# Patient Record
Sex: Male | Born: 1945
Health system: Southern US, Community
[De-identification: ages and names within clinical notes are randomized; demographics above are authoritative.]

## PROBLEM LIST (undated history)

## (undated) ENCOUNTER — Ambulatory Visit (HOSPITAL_BASED_OUTPATIENT_CLINIC_OR_DEPARTMENT_OTHER): Source: Home / Self Care

## (undated) DIAGNOSIS — M1990 Unspecified osteoarthritis, unspecified site: Secondary | ICD-10-CM

## (undated) DIAGNOSIS — N052 Unspecified nephritic syndrome with diffuse membranous glomerulonephritis: Secondary | ICD-10-CM

## (undated) DIAGNOSIS — E041 Nontoxic single thyroid nodule: Secondary | ICD-10-CM

## (undated) DIAGNOSIS — D137 Benign neoplasm of endocrine pancreas: Secondary | ICD-10-CM

## (undated) DIAGNOSIS — R7881 Bacteremia: Secondary | ICD-10-CM

## (undated) DIAGNOSIS — C801 Malignant (primary) neoplasm, unspecified: Secondary | ICD-10-CM

## (undated) DIAGNOSIS — E7800 Pure hypercholesterolemia, unspecified: Secondary | ICD-10-CM

## (undated) DIAGNOSIS — I82409 Acute embolism and thrombosis of unspecified deep veins of unspecified lower extremity: Secondary | ICD-10-CM

## (undated) DIAGNOSIS — R768 Other specified abnormal immunological findings in serum: Secondary | ICD-10-CM

## (undated) DIAGNOSIS — K5790 Diverticulosis of intestine, part unspecified, without perforation or abscess without bleeding: Secondary | ICD-10-CM

## (undated) DIAGNOSIS — I2699 Other pulmonary embolism without acute cor pulmonale: Secondary | ICD-10-CM

## (undated) DIAGNOSIS — K859 Acute pancreatitis without necrosis or infection, unspecified: Secondary | ICD-10-CM

## (undated) DIAGNOSIS — I8290 Acute embolism and thrombosis of unspecified vein: Secondary | ICD-10-CM

## (undated) DIAGNOSIS — B9620 Unspecified Escherichia coli [E. coli] as the cause of diseases classified elsewhere: Secondary | ICD-10-CM

## (undated) DIAGNOSIS — I10 Essential (primary) hypertension: Secondary | ICD-10-CM

## (undated) HISTORY — DX: Malignant (primary) neoplasm, unspecified: C80.1

## (undated) HISTORY — DX: Acute embolism and thrombosis of unspecified deep veins of unspecified lower extremity: I82.409

## (undated) HISTORY — DX: Benign neoplasm of endocrine pancreas: D13.7

## (undated) HISTORY — DX: Diverticulosis of intestine, part unspecified, without perforation or abscess without bleeding: K57.90

## (undated) HISTORY — DX: Acute pancreatitis without necrosis or infection, unspecified: K85.90

## (undated) HISTORY — DX: Unspecified Escherichia coli (E. coli) as the cause of diseases classified elsewhere: B96.20

## (undated) HISTORY — DX: Unspecified nephritic syndrome with diffuse membranous glomerulonephritis: N05.2

## (undated) HISTORY — PX: TONSILLECTOMY: SUR1361

## (undated) HISTORY — DX: Nontoxic single thyroid nodule: E04.1

## (undated) HISTORY — PX: CHOLECYSTECTOMY: SHX55

## (undated) HISTORY — PX: BIOPSY THYROID: PRO38

## (undated) HISTORY — PX: WHIPPLE PROCEDURE: SHX2667

## (undated) HISTORY — PX: CARPAL TUNNEL RELEASE: SHX101

## (undated) HISTORY — DX: Unspecified osteoarthritis, unspecified site: M19.90

## (undated) HISTORY — DX: Essential (primary) hypertension: I10

## (undated) HISTORY — PX: RENAL BIOPSY: SHX156

## (undated) HISTORY — DX: Acute embolism and thrombosis of unspecified vein: I82.90

## (undated) HISTORY — DX: Pure hypercholesterolemia, unspecified: E78.00

## (undated) HISTORY — DX: Bacteremia: R78.81

## (undated) HISTORY — DX: Other specified abnormal immunological findings in serum: R76.8

## (undated) HISTORY — DX: Other pulmonary embolism without acute cor pulmonale: I26.99

## (undated) HISTORY — PX: PARTIAL COLECTOMY: SHX5273

---

## 2000-04-09 HISTORY — PX: PANCREATICODUODENECTOMY: SUR1000

## 2014-04-13 DIAGNOSIS — M5481 Occipital neuralgia: Secondary | ICD-10-CM | POA: Diagnosis not present

## 2014-04-15 DIAGNOSIS — M5481 Occipital neuralgia: Secondary | ICD-10-CM | POA: Diagnosis not present

## 2014-04-20 DIAGNOSIS — M5481 Occipital neuralgia: Secondary | ICD-10-CM | POA: Diagnosis not present

## 2014-04-22 DIAGNOSIS — M5481 Occipital neuralgia: Secondary | ICD-10-CM | POA: Diagnosis not present

## 2014-04-27 DIAGNOSIS — M5481 Occipital neuralgia: Secondary | ICD-10-CM | POA: Diagnosis not present

## 2014-04-29 DIAGNOSIS — M5481 Occipital neuralgia: Secondary | ICD-10-CM | POA: Diagnosis not present

## 2014-05-05 DIAGNOSIS — M5481 Occipital neuralgia: Secondary | ICD-10-CM | POA: Diagnosis not present

## 2014-05-07 DIAGNOSIS — M5481 Occipital neuralgia: Secondary | ICD-10-CM | POA: Diagnosis not present

## 2014-05-18 DIAGNOSIS — M5481 Occipital neuralgia: Secondary | ICD-10-CM | POA: Diagnosis not present

## 2014-05-20 DIAGNOSIS — M5481 Occipital neuralgia: Secondary | ICD-10-CM | POA: Diagnosis not present

## 2014-05-25 DIAGNOSIS — M5481 Occipital neuralgia: Secondary | ICD-10-CM | POA: Diagnosis not present

## 2014-05-28 DIAGNOSIS — M5481 Occipital neuralgia: Secondary | ICD-10-CM | POA: Diagnosis not present

## 2014-06-01 DIAGNOSIS — C229 Malignant neoplasm of liver, not specified as primary or secondary: Secondary | ICD-10-CM | POA: Diagnosis not present

## 2014-06-02 DIAGNOSIS — M5481 Occipital neuralgia: Secondary | ICD-10-CM | POA: Diagnosis not present

## 2014-06-04 DIAGNOSIS — M5481 Occipital neuralgia: Secondary | ICD-10-CM | POA: Diagnosis not present

## 2014-06-08 DIAGNOSIS — M5481 Occipital neuralgia: Secondary | ICD-10-CM | POA: Diagnosis not present

## 2014-06-10 DIAGNOSIS — Z86018 Personal history of other benign neoplasm: Secondary | ICD-10-CM | POA: Diagnosis not present

## 2014-06-10 DIAGNOSIS — Z8507 Personal history of malignant neoplasm of pancreas: Secondary | ICD-10-CM | POA: Diagnosis not present

## 2014-06-10 DIAGNOSIS — R5381 Other malaise: Secondary | ICD-10-CM | POA: Diagnosis not present

## 2014-06-10 DIAGNOSIS — R61 Generalized hyperhidrosis: Secondary | ICD-10-CM | POA: Diagnosis not present

## 2014-06-11 DIAGNOSIS — M5481 Occipital neuralgia: Secondary | ICD-10-CM | POA: Diagnosis not present

## 2014-06-22 DIAGNOSIS — M5481 Occipital neuralgia: Secondary | ICD-10-CM | POA: Diagnosis not present

## 2014-06-24 DIAGNOSIS — M5481 Occipital neuralgia: Secondary | ICD-10-CM | POA: Diagnosis not present

## 2014-06-29 DIAGNOSIS — M5481 Occipital neuralgia: Secondary | ICD-10-CM | POA: Diagnosis not present

## 2014-07-01 DIAGNOSIS — M5481 Occipital neuralgia: Secondary | ICD-10-CM | POA: Diagnosis not present

## 2014-07-06 DIAGNOSIS — M5481 Occipital neuralgia: Secondary | ICD-10-CM | POA: Diagnosis not present

## 2014-07-09 DIAGNOSIS — M5481 Occipital neuralgia: Secondary | ICD-10-CM | POA: Diagnosis not present

## 2014-07-12 DIAGNOSIS — G5601 Carpal tunnel syndrome, right upper limb: Secondary | ICD-10-CM | POA: Diagnosis not present

## 2014-07-12 DIAGNOSIS — R42 Dizziness and giddiness: Secondary | ICD-10-CM | POA: Diagnosis not present

## 2014-07-12 DIAGNOSIS — S060X1D Concussion with loss of consciousness of 30 minutes or less, subsequent encounter: Secondary | ICD-10-CM | POA: Diagnosis not present

## 2014-07-12 DIAGNOSIS — M5032 Other cervical disc degeneration, mid-cervical region: Secondary | ICD-10-CM | POA: Diagnosis not present

## 2014-07-15 DIAGNOSIS — M5481 Occipital neuralgia: Secondary | ICD-10-CM | POA: Diagnosis not present

## 2014-07-19 DIAGNOSIS — M5481 Occipital neuralgia: Secondary | ICD-10-CM | POA: Diagnosis not present

## 2014-07-26 DIAGNOSIS — M5481 Occipital neuralgia: Secondary | ICD-10-CM | POA: Diagnosis not present

## 2014-07-29 DIAGNOSIS — M5481 Occipital neuralgia: Secondary | ICD-10-CM | POA: Diagnosis not present

## 2014-08-02 DIAGNOSIS — M5481 Occipital neuralgia: Secondary | ICD-10-CM | POA: Diagnosis not present

## 2014-08-05 DIAGNOSIS — M5481 Occipital neuralgia: Secondary | ICD-10-CM | POA: Diagnosis not present

## 2014-08-09 DIAGNOSIS — R05 Cough: Secondary | ICD-10-CM | POA: Diagnosis not present

## 2014-08-09 DIAGNOSIS — J208 Acute bronchitis due to other specified organisms: Secondary | ICD-10-CM | POA: Diagnosis not present

## 2014-08-10 DIAGNOSIS — M5481 Occipital neuralgia: Secondary | ICD-10-CM | POA: Diagnosis not present

## 2014-11-22 DIAGNOSIS — Z0000 Encounter for general adult medical examination without abnormal findings: Secondary | ICD-10-CM | POA: Diagnosis not present

## 2014-11-22 DIAGNOSIS — Z125 Encounter for screening for malignant neoplasm of prostate: Secondary | ICD-10-CM | POA: Diagnosis not present

## 2014-11-22 DIAGNOSIS — R109 Unspecified abdominal pain: Secondary | ICD-10-CM | POA: Diagnosis not present

## 2014-11-22 DIAGNOSIS — N529 Male erectile dysfunction, unspecified: Secondary | ICD-10-CM | POA: Diagnosis not present

## 2014-11-22 DIAGNOSIS — Z7189 Other specified counseling: Secondary | ICD-10-CM | POA: Diagnosis not present

## 2014-12-03 DIAGNOSIS — Z1211 Encounter for screening for malignant neoplasm of colon: Secondary | ICD-10-CM | POA: Diagnosis not present

## 2015-01-21 DIAGNOSIS — Z23 Encounter for immunization: Secondary | ICD-10-CM | POA: Diagnosis not present

## 2015-03-24 DIAGNOSIS — R51 Headache: Secondary | ICD-10-CM | POA: Diagnosis not present

## 2015-03-24 DIAGNOSIS — M50323 Other cervical disc degeneration at C6-C7 level: Secondary | ICD-10-CM | POA: Diagnosis not present

## 2015-03-29 DIAGNOSIS — R51 Headache: Secondary | ICD-10-CM | POA: Diagnosis not present

## 2015-04-22 DIAGNOSIS — R51 Headache: Secondary | ICD-10-CM | POA: Diagnosis not present

## 2015-04-22 DIAGNOSIS — M50323 Other cervical disc degeneration at C6-C7 level: Secondary | ICD-10-CM | POA: Diagnosis not present

## 2015-07-01 DIAGNOSIS — L0390 Cellulitis, unspecified: Secondary | ICD-10-CM | POA: Diagnosis not present

## 2015-07-18 DIAGNOSIS — H2513 Age-related nuclear cataract, bilateral: Secondary | ICD-10-CM | POA: Diagnosis not present

## 2015-07-18 DIAGNOSIS — H04123 Dry eye syndrome of bilateral lacrimal glands: Secondary | ICD-10-CM | POA: Diagnosis not present

## 2015-09-06 DIAGNOSIS — C254 Malignant neoplasm of endocrine pancreas: Secondary | ICD-10-CM | POA: Diagnosis not present

## 2015-09-06 DIAGNOSIS — C259 Malignant neoplasm of pancreas, unspecified: Secondary | ICD-10-CM | POA: Diagnosis not present

## 2015-09-06 DIAGNOSIS — D490 Neoplasm of unspecified behavior of digestive system: Secondary | ICD-10-CM | POA: Diagnosis not present

## 2015-09-06 DIAGNOSIS — Z8639 Personal history of other endocrine, nutritional and metabolic disease: Secondary | ICD-10-CM | POA: Diagnosis not present

## 2015-09-07 DIAGNOSIS — C259 Malignant neoplasm of pancreas, unspecified: Secondary | ICD-10-CM | POA: Diagnosis not present

## 2015-09-07 DIAGNOSIS — R918 Other nonspecific abnormal finding of lung field: Secondary | ICD-10-CM | POA: Diagnosis not present

## 2015-09-07 DIAGNOSIS — Z8507 Personal history of malignant neoplasm of pancreas: Secondary | ICD-10-CM | POA: Diagnosis not present

## 2015-10-14 DIAGNOSIS — E559 Vitamin D deficiency, unspecified: Secondary | ICD-10-CM | POA: Diagnosis not present

## 2015-10-14 DIAGNOSIS — H02826 Cysts of left eye, unspecified eyelid: Secondary | ICD-10-CM | POA: Diagnosis not present

## 2015-10-14 DIAGNOSIS — D137 Benign neoplasm of endocrine pancreas: Secondary | ICD-10-CM | POA: Diagnosis not present

## 2015-10-14 DIAGNOSIS — M5412 Radiculopathy, cervical region: Secondary | ICD-10-CM | POA: Diagnosis not present

## 2015-10-21 DIAGNOSIS — D485 Neoplasm of uncertain behavior of skin: Secondary | ICD-10-CM | POA: Diagnosis not present

## 2015-10-24 DIAGNOSIS — H0289 Other specified disorders of eyelid: Secondary | ICD-10-CM | POA: Diagnosis not present

## 2016-01-23 DIAGNOSIS — Z23 Encounter for immunization: Secondary | ICD-10-CM | POA: Diagnosis not present

## 2016-03-19 DIAGNOSIS — M353 Polymyalgia rheumatica: Secondary | ICD-10-CM | POA: Diagnosis not present

## 2016-03-19 DIAGNOSIS — Z125 Encounter for screening for malignant neoplasm of prostate: Secondary | ICD-10-CM | POA: Diagnosis not present

## 2016-03-19 DIAGNOSIS — R361 Hematospermia: Secondary | ICD-10-CM | POA: Diagnosis not present

## 2016-03-23 DIAGNOSIS — Z1211 Encounter for screening for malignant neoplasm of colon: Secondary | ICD-10-CM | POA: Diagnosis not present

## 2016-06-06 DIAGNOSIS — R3912 Poor urinary stream: Secondary | ICD-10-CM | POA: Diagnosis not present

## 2016-06-06 DIAGNOSIS — R361 Hematospermia: Secondary | ICD-10-CM | POA: Diagnosis not present

## 2016-06-06 DIAGNOSIS — N401 Enlarged prostate with lower urinary tract symptoms: Secondary | ICD-10-CM | POA: Diagnosis not present

## 2016-07-03 DIAGNOSIS — R351 Nocturia: Secondary | ICD-10-CM | POA: Diagnosis not present

## 2016-07-03 DIAGNOSIS — N401 Enlarged prostate with lower urinary tract symptoms: Secondary | ICD-10-CM | POA: Diagnosis not present

## 2016-07-03 DIAGNOSIS — D137 Benign neoplasm of endocrine pancreas: Secondary | ICD-10-CM | POA: Diagnosis not present

## 2016-07-03 DIAGNOSIS — R1032 Left lower quadrant pain: Secondary | ICD-10-CM | POA: Diagnosis not present

## 2016-07-03 DIAGNOSIS — Z0000 Encounter for general adult medical examination without abnormal findings: Secondary | ICD-10-CM | POA: Diagnosis not present

## 2016-07-03 DIAGNOSIS — Z79899 Other long term (current) drug therapy: Secondary | ICD-10-CM | POA: Diagnosis not present

## 2016-07-09 DIAGNOSIS — D137 Benign neoplasm of endocrine pancreas: Secondary | ICD-10-CM | POA: Diagnosis not present

## 2016-07-09 DIAGNOSIS — R1030 Lower abdominal pain, unspecified: Secondary | ICD-10-CM | POA: Diagnosis not present

## 2016-08-20 DIAGNOSIS — L821 Other seborrheic keratosis: Secondary | ICD-10-CM | POA: Diagnosis not present

## 2016-08-20 DIAGNOSIS — D225 Melanocytic nevi of trunk: Secondary | ICD-10-CM | POA: Diagnosis not present

## 2016-08-20 DIAGNOSIS — L578 Other skin changes due to chronic exposure to nonionizing radiation: Secondary | ICD-10-CM | POA: Diagnosis not present

## 2016-09-04 DIAGNOSIS — C259 Malignant neoplasm of pancreas, unspecified: Secondary | ICD-10-CM | POA: Diagnosis not present

## 2016-09-05 DIAGNOSIS — D137 Benign neoplasm of endocrine pancreas: Secondary | ICD-10-CM | POA: Diagnosis not present

## 2016-10-26 DIAGNOSIS — H2513 Age-related nuclear cataract, bilateral: Secondary | ICD-10-CM | POA: Diagnosis not present

## 2016-11-07 DIAGNOSIS — Z6826 Body mass index (BMI) 26.0-26.9, adult: Secondary | ICD-10-CM | POA: Diagnosis not present

## 2016-11-07 DIAGNOSIS — Z9181 History of falling: Secondary | ICD-10-CM | POA: Diagnosis not present

## 2016-11-07 DIAGNOSIS — S63611A Unspecified sprain of left index finger, initial encounter: Secondary | ICD-10-CM | POA: Diagnosis not present

## 2016-11-19 DIAGNOSIS — I2699 Other pulmonary embolism without acute cor pulmonale: Secondary | ICD-10-CM | POA: Diagnosis not present

## 2016-11-19 DIAGNOSIS — R079 Chest pain, unspecified: Secondary | ICD-10-CM | POA: Diagnosis not present

## 2016-11-19 DIAGNOSIS — R109 Unspecified abdominal pain: Secondary | ICD-10-CM | POA: Diagnosis not present

## 2016-11-19 DIAGNOSIS — Z87891 Personal history of nicotine dependence: Secondary | ICD-10-CM | POA: Diagnosis not present

## 2016-11-21 DIAGNOSIS — I2699 Other pulmonary embolism without acute cor pulmonale: Secondary | ICD-10-CM | POA: Diagnosis not present

## 2016-11-21 DIAGNOSIS — E041 Nontoxic single thyroid nodule: Secondary | ICD-10-CM | POA: Diagnosis not present

## 2016-11-27 DIAGNOSIS — E041 Nontoxic single thyroid nodule: Secondary | ICD-10-CM | POA: Diagnosis not present

## 2016-12-04 DIAGNOSIS — E041 Nontoxic single thyroid nodule: Secondary | ICD-10-CM | POA: Diagnosis not present

## 2016-12-18 DIAGNOSIS — E041 Nontoxic single thyroid nodule: Secondary | ICD-10-CM | POA: Diagnosis not present

## 2016-12-20 ENCOUNTER — Other Ambulatory Visit: Payer: Self-pay | Admitting: Surgery

## 2016-12-20 DIAGNOSIS — E041 Nontoxic single thyroid nodule: Secondary | ICD-10-CM

## 2017-01-02 ENCOUNTER — Other Ambulatory Visit (HOSPITAL_COMMUNITY)
Admission: RE | Admit: 2017-01-02 | Discharge: 2017-01-02 | Disposition: A | Discharge status: Home / Self Care | Payer: Medicare Other | Source: Ambulatory Visit | Attending: Radiology | Admitting: Radiology

## 2017-01-02 ENCOUNTER — Ambulatory Visit
Admission: RE | Admit: 2017-01-02 | Discharge: 2017-01-02 | Disposition: A | Discharge status: Home / Self Care | Payer: Medicare Other | Source: Ambulatory Visit | Attending: Surgery | Admitting: Surgery

## 2017-01-02 DIAGNOSIS — E041 Nontoxic single thyroid nodule: Secondary | ICD-10-CM | POA: Insufficient documentation

## 2017-02-08 DIAGNOSIS — H43811 Vitreous degeneration, right eye: Secondary | ICD-10-CM | POA: Diagnosis not present

## 2017-02-21 DIAGNOSIS — Z23 Encounter for immunization: Secondary | ICD-10-CM | POA: Diagnosis not present

## 2017-02-22 DIAGNOSIS — H35371 Puckering of macula, right eye: Secondary | ICD-10-CM | POA: Diagnosis not present

## 2017-03-26 DIAGNOSIS — M26621 Arthralgia of right temporomandibular joint: Secondary | ICD-10-CM | POA: Diagnosis not present

## 2017-03-26 DIAGNOSIS — J019 Acute sinusitis, unspecified: Secondary | ICD-10-CM | POA: Diagnosis not present

## 2017-03-26 DIAGNOSIS — B9689 Other specified bacterial agents as the cause of diseases classified elsewhere: Secondary | ICD-10-CM | POA: Diagnosis not present

## 2017-03-26 DIAGNOSIS — Z6826 Body mass index (BMI) 26.0-26.9, adult: Secondary | ICD-10-CM | POA: Diagnosis not present

## 2017-07-10 DIAGNOSIS — I2699 Other pulmonary embolism without acute cor pulmonale: Secondary | ICD-10-CM | POA: Diagnosis not present

## 2017-07-10 DIAGNOSIS — Z79899 Other long term (current) drug therapy: Secondary | ICD-10-CM | POA: Diagnosis not present

## 2017-07-10 DIAGNOSIS — N401 Enlarged prostate with lower urinary tract symptoms: Secondary | ICD-10-CM | POA: Diagnosis not present

## 2017-07-10 DIAGNOSIS — R351 Nocturia: Secondary | ICD-10-CM | POA: Diagnosis not present

## 2017-07-10 DIAGNOSIS — E041 Nontoxic single thyroid nodule: Secondary | ICD-10-CM | POA: Diagnosis not present

## 2017-07-10 DIAGNOSIS — Z0000 Encounter for general adult medical examination without abnormal findings: Secondary | ICD-10-CM | POA: Diagnosis not present

## 2017-08-27 DIAGNOSIS — L578 Other skin changes due to chronic exposure to nonionizing radiation: Secondary | ICD-10-CM | POA: Diagnosis not present

## 2017-08-27 DIAGNOSIS — L219 Seborrheic dermatitis, unspecified: Secondary | ICD-10-CM | POA: Diagnosis not present

## 2017-08-27 DIAGNOSIS — L821 Other seborrheic keratosis: Secondary | ICD-10-CM | POA: Diagnosis not present

## 2017-08-27 DIAGNOSIS — D225 Melanocytic nevi of trunk: Secondary | ICD-10-CM | POA: Diagnosis not present

## 2017-09-16 DIAGNOSIS — D137 Benign neoplasm of endocrine pancreas: Secondary | ICD-10-CM | POA: Diagnosis not present

## 2017-09-16 DIAGNOSIS — C20 Malignant neoplasm of rectum: Secondary | ICD-10-CM | POA: Diagnosis not present

## 2017-09-18 DIAGNOSIS — D3A8 Other benign neuroendocrine tumors: Secondary | ICD-10-CM | POA: Diagnosis not present

## 2017-10-17 DIAGNOSIS — E041 Nontoxic single thyroid nodule: Secondary | ICD-10-CM | POA: Diagnosis not present

## 2017-10-17 DIAGNOSIS — R1030 Lower abdominal pain, unspecified: Secondary | ICD-10-CM | POA: Diagnosis not present

## 2017-10-17 DIAGNOSIS — R770 Abnormality of albumin: Secondary | ICD-10-CM | POA: Diagnosis not present

## 2017-10-17 DIAGNOSIS — Z9889 Other specified postprocedural states: Secondary | ICD-10-CM | POA: Diagnosis not present

## 2017-10-24 DIAGNOSIS — R635 Abnormal weight gain: Secondary | ICD-10-CM | POA: Diagnosis not present

## 2017-10-24 DIAGNOSIS — R509 Fever, unspecified: Secondary | ICD-10-CM | POA: Diagnosis not present

## 2017-10-24 DIAGNOSIS — D3A8 Other benign neuroendocrine tumors: Secondary | ICD-10-CM | POA: Diagnosis not present

## 2017-11-19 DIAGNOSIS — R809 Proteinuria, unspecified: Secondary | ICD-10-CM | POA: Diagnosis not present

## 2017-11-19 DIAGNOSIS — R1084 Generalized abdominal pain: Secondary | ICD-10-CM | POA: Diagnosis not present

## 2017-11-19 DIAGNOSIS — Z9889 Other specified postprocedural states: Secondary | ICD-10-CM | POA: Diagnosis not present

## 2017-11-19 DIAGNOSIS — R3129 Other microscopic hematuria: Secondary | ICD-10-CM | POA: Diagnosis not present

## 2017-11-21 ENCOUNTER — Encounter: Payer: Self-pay | Admitting: Gastroenterology

## 2017-11-22 DIAGNOSIS — R809 Proteinuria, unspecified: Secondary | ICD-10-CM | POA: Diagnosis not present

## 2017-11-25 ENCOUNTER — Ambulatory Visit (INDEPENDENT_AMBULATORY_CARE_PROVIDER_SITE_OTHER): Payer: Medicare Other | Admitting: Gastroenterology

## 2017-11-25 ENCOUNTER — Encounter: Payer: Self-pay | Admitting: Gastroenterology

## 2017-11-25 VITALS — BP 126/70 | HR 73 | Ht 72.0 in | Wt 197.4 lb

## 2017-11-25 DIAGNOSIS — E8809 Other disorders of plasma-protein metabolism, not elsewhere classified: Secondary | ICD-10-CM

## 2017-11-25 DIAGNOSIS — Z1212 Encounter for screening for malignant neoplasm of rectum: Secondary | ICD-10-CM

## 2017-11-25 DIAGNOSIS — D137 Benign neoplasm of endocrine pancreas: Secondary | ICD-10-CM | POA: Diagnosis not present

## 2017-11-25 DIAGNOSIS — Z1211 Encounter for screening for malignant neoplasm of colon: Secondary | ICD-10-CM | POA: Diagnosis not present

## 2017-11-25 DIAGNOSIS — R109 Unspecified abdominal pain: Secondary | ICD-10-CM | POA: Diagnosis not present

## 2017-11-25 DIAGNOSIS — R933 Abnormal findings on diagnostic imaging of other parts of digestive tract: Secondary | ICD-10-CM

## 2017-11-25 NOTE — Progress Notes (Signed)
Chief Complaint: Abdominal pain, change in bowel habits, CRC screening   Referring Provider:     Self    HPI:     Stephen Gross is a 72 y.o. male with a history of insulinoma status post Whipple resection at [facility] in 2002 presenting to the [facility] for evaluation of abdominal pain and change in bowel habits.  For his insulinoma, he continues to follow at [facility] with his oncologist Dr. Carlis Abbott.  He is most recently seen in June 2019 with surveillance MRI at that time notable for a new bandlike area of arterial hyperenhancement in hepatic segment 5 suspicious for perfusion anomaly.  Recommendation made for repeat abdominal MRI with Eovist which he has scheduled at [facility] in September with subsequent follow-up with Dr. Carlis Abbott.   Had a few years of cyclic fevers, typically lasting <24 hours. 1 admission to [place] for these sxs with E coli bacteremia thought 2/2 reflux cholangitis. No with very infrequent fevers.   Today, he states that he has lower abdominal pain. Occurs intermittently, LLQ>RLQ. Can keep him awake at nights with pain. Sometimes improves with Tums, but this is not reliable.  While he does endorse alternating bowel habits for the last year or so, his abdominal pain seems independent of BMs.  No improvement in pain with BM.  No change in pain with p.o. intake.  No associated hematochezia, melena, fever, chills, night sweats.  Per patient he has a Hx of anastamotic ulcers, related to NSAIDs. Has not taken NSAIDs in years.   Last colonoscopy was 2009 and notable for sigmoid diverticulosis otherwise normal, with recommendation to repeat in 10 years. No known family history of CRC, GI malignancy, liver disease, pancreatic disease, or IBD.   Recent labs notable for normal chromogranin A (2), low normal TSH and an abnormal urinalysis, prompting referral to urology at [facility], she has an appointment scheduled otherwise recent normal WBC, hemoglobin,  hematocrit, platelet, normal BMP.  Remainder of CMP notable for low protein at 5.2 low albumin at 2.9 otherwise normal liver enzymes in June 2019. Hepatic synthetic function otherwise preserved on available labs.   Past Medical History:  Diagnosis Date  . Arthritis   . Blood clot in vein    in lung  . Cancer ([place])    insulinoma  . Diverticulosis   . Pancreatitis      Past Surgical History:  Procedure Laterality Date  . PANCREATICODUODENECTOMY  2002   Family History  Problem Relation Age of Onset  . Heart disease Mother   . Heart disease Father   . Lung cancer Paternal Uncle   . Colon cancer Neg Hx   . Esophageal cancer Neg Hx    Social History   Tobacco Use  . Smoking status: Former Smoker    Last attempt to quit: 1972    Years since quitting: 47.6  . Smokeless tobacco: Never Used  Substance Use Topics  . Alcohol use: Yes  . Drug use: Never   Current Outpatient Medications  Medication Sig Dispense Refill  . Ascorbic Acid (VITAMIN C ER PO) Take 1 tablet by mouth daily.    Marland Kitchen ketoconazole (NIZORAL) 2 % shampoo APPLY TO AFFECTED AREA 1 TO 2 TIMES A DAY  0  . Melatonin 3-10 MG TABS Take 1 tablet by mouth daily.    . Multiple Vitamins-Minerals (CENTRUM SILVER) tablet Take 1 tablet by mouth daily.    . Probiotic Product (  FORTIFY DAILY PROBIOTIC PO) Take 1 tablet by mouth daily.    . tamsulosin (FLOMAX) 0.4 MG CAPS capsule Take 0.4 mg by mouth daily.  3  . acetaminophen (TYLENOL) 500 MG tablet Take 500 mg by mouth as needed.    . Cholecalciferol (D3 VITAMIN PO) Take 1 tablet by mouth as needed (Not taking in summer months).     No current facility-administered medications for this visit.    Allergies  Allergen Reactions  . Nsaids     Bleeding internally     Review of Systems: All systems reviewed and negative except where noted in HPI.     Physical Exam:    Wt Readings from Last 3 Encounters:  11/25/17 197 lb 6 oz (89.5 kg)    BP 126/70   Pulse 73   Ht  6' (1.829 m)   Wt 197 lb 6 oz (89.5 kg)   BMI 26.77 kg/m  Constitutional:  Pleasant, in no acute distress. Psychiatric: Normal mood and affect. Behavior is normal. EENT: Pupils normal.  Conjunctivae are normal. No scleral icterus. Neck supple. No cervical LAD. Cardiovascular: Normal rate, regular rhythm. No edema Pulmonary/chest: Effort normal and breath sounds normal. No wheezing, rales or rhonchi. Abdominal: Soft, nondistended, nontender. Bowel sounds active throughout. There are no masses palpable. No hepatomegaly. Neurological: Alert and oriented to person place and time. Skin: Skin is warm and dry. No rashes noted.   ASSESSMENT AND PLAN;    1) CRC screen: Patient is due for routine age-appropriate colon cancer screening.  We will set up for screening colonoscopy.  2) Abd pain Mild, intermittent abdominal pain.  Discussed potential GI etiologies as well as non-GI (i.e. MSK) etiologies for presenting symptoms.  Can evaluate for luminal or mucosal etiology with colonoscopy as scheduled above.  Additionally given the vagueness of pain, prior surgical history, prior reported marginal ulcers at anastomotic site, will also evaluate for mucosal etiology with endoscopy.  3) Low protein Reduced protein and albumin on recent labs likely related to some degree of malabsorption with postsurgical anatomy.  Discussed increasing dietary protein intake which he is already started to do.  Otherwise no evidence impaired hepatic synthetic function.  Can assess for malabsorptive state with upper endoscopy and small bowel biopsies.  Pending endoscopic/histologic findings, and depending on repeat protein in 3 to 6 months can also assess for micronutrient deficiency as appropriate.  4) Hx of Whipple: History of insulinoma status post Whipple in 2002.  Obtains the majority of his care at [facility] and follows closely with Dr. Carlis Abbott, oncologist.  Has a follow-up scheduled for next month to include  repeat MRI with Eovist to evaluate hepatic abnormality seen on prior MRI.  5) abnormal imaging study: Previous MRI with a new bandlike area of arterial hyperenhancement in hepatic segment 5 with plan for repeat MRI with [facility] at [facility] as noted above.  The indications, risks, and benefits of EGD and colonoscopy were explained to the patient in detail. Risks include but are not limited to bleeding, perforation, adverse reaction to medications, and cardiopulmonary compromise. Sequelae include but are not limited to the possibility of surgery, hositalization, and mortality. The patient verbalized understanding and wished to proceed. All questions answered, referred to scheduler and bowel prep ordered. Further recommendations pending results of the exam.     Lavena Bullion, DO, FACG  11/25/2017, 2:26 PM   Cyndy Freeze, MD

## 2017-11-25 NOTE — Patient Instructions (Signed)
If you are age 71 or older, your body mass index should be between 23-30. Your Body mass index is 26.77 kg/m. If this is out of the aforementioned range listed, please consider follow up with your Primary Care Provider.  If you are age 71 or younger, your body mass index should be between 19-25. Your Body mass index is 26.77 kg/m. If this is out of the aformentioned range listed, please consider follow up with your Primary Care Provider.   You have been scheduled for an endoscopy and colonoscopy. Please follow the written instructions given to you at your visit today. Please pick up your prep supplies at the pharmacy within the next 1-3 days. If you use inhalers (even only as needed), please bring them with you on the day of your procedure. Your physician has requested that you go to www.startemmi.com and enter the access code given to you at your visit today. This web site gives a general overview about your procedure. However, you should still follow specific instructions given to you by our office regarding your preparation for the procedure.  It was a pleasure to see you today!  Vito Cirigliano, D.O.

## 2017-11-27 ENCOUNTER — Telehealth: Payer: Self-pay | Admitting: Gastroenterology

## 2017-11-27 DIAGNOSIS — H6092 Unspecified otitis externa, left ear: Secondary | ICD-10-CM | POA: Diagnosis not present

## 2017-11-27 DIAGNOSIS — Z6825 Body mass index (BMI) 25.0-25.9, adult: Secondary | ICD-10-CM | POA: Diagnosis not present

## 2017-11-27 DIAGNOSIS — H6122 Impacted cerumen, left ear: Secondary | ICD-10-CM | POA: Diagnosis not present

## 2017-11-28 ENCOUNTER — Other Ambulatory Visit: Payer: Self-pay

## 2017-11-28 MED ORDER — SOD PICOSULFATE-MAG OX-CIT ACD 10-3.5-12 MG-GM -GM/160ML PO SOLN: 1.0000 | ORAL | 0 refills | Status: DC

## 2017-11-28 NOTE — Telephone Encounter (Signed)
Sent rx to pt pharmacy and notified pt wife.

## 2017-12-04 DIAGNOSIS — R933 Abnormal findings on diagnostic imaging of other parts of digestive tract: Secondary | ICD-10-CM | POA: Diagnosis not present

## 2017-12-04 DIAGNOSIS — R945 Abnormal results of liver function studies: Secondary | ICD-10-CM | POA: Diagnosis not present

## 2017-12-04 DIAGNOSIS — R978 Other abnormal tumor markers: Secondary | ICD-10-CM | POA: Diagnosis not present

## 2017-12-04 DIAGNOSIS — R970 Elevated carcinoembryonic antigen [CEA]: Secondary | ICD-10-CM | POA: Diagnosis not present

## 2017-12-04 DIAGNOSIS — Z1159 Encounter for screening for other viral diseases: Secondary | ICD-10-CM | POA: Diagnosis not present

## 2017-12-04 DIAGNOSIS — D137 Benign neoplasm of endocrine pancreas: Secondary | ICD-10-CM | POA: Diagnosis not present

## 2017-12-04 DIAGNOSIS — N049 Nephrotic syndrome with unspecified morphologic changes: Secondary | ICD-10-CM | POA: Diagnosis not present

## 2017-12-04 DIAGNOSIS — Z114 Encounter for screening for human immunodeficiency virus [HIV]: Secondary | ICD-10-CM | POA: Diagnosis not present

## 2017-12-04 DIAGNOSIS — R801 Persistent proteinuria, unspecified: Secondary | ICD-10-CM | POA: Diagnosis not present

## 2017-12-04 DIAGNOSIS — E7800 Pure hypercholesterolemia, unspecified: Secondary | ICD-10-CM | POA: Diagnosis not present

## 2017-12-04 DIAGNOSIS — R3121 Asymptomatic microscopic hematuria: Secondary | ICD-10-CM | POA: Diagnosis not present

## 2017-12-06 DIAGNOSIS — R801 Persistent proteinuria, unspecified: Secondary | ICD-10-CM | POA: Diagnosis not present

## 2017-12-12 DIAGNOSIS — E7800 Pure hypercholesterolemia, unspecified: Secondary | ICD-10-CM | POA: Diagnosis present

## 2017-12-12 DIAGNOSIS — N042 Nephrotic syndrome with diffuse membranous glomerulonephritis, unspecified: Secondary | ICD-10-CM | POA: Diagnosis present

## 2017-12-16 DIAGNOSIS — N052 Unspecified nephritic syndrome with diffuse membranous glomerulonephritis: Secondary | ICD-10-CM | POA: Diagnosis not present

## 2017-12-16 DIAGNOSIS — R809 Proteinuria, unspecified: Secondary | ICD-10-CM | POA: Diagnosis not present

## 2017-12-16 DIAGNOSIS — N400 Enlarged prostate without lower urinary tract symptoms: Secondary | ICD-10-CM | POA: Diagnosis not present

## 2017-12-16 DIAGNOSIS — N3289 Other specified disorders of bladder: Secondary | ICD-10-CM | POA: Diagnosis not present

## 2017-12-17 DIAGNOSIS — R809 Proteinuria, unspecified: Secondary | ICD-10-CM | POA: Diagnosis not present

## 2017-12-17 DIAGNOSIS — N400 Enlarged prostate without lower urinary tract symptoms: Secondary | ICD-10-CM | POA: Diagnosis not present

## 2017-12-17 DIAGNOSIS — N3289 Other specified disorders of bladder: Secondary | ICD-10-CM | POA: Diagnosis not present

## 2017-12-17 DIAGNOSIS — N052 Unspecified nephritic syndrome with diffuse membranous glomerulonephritis: Secondary | ICD-10-CM | POA: Diagnosis not present

## 2017-12-23 DIAGNOSIS — K769 Liver disease, unspecified: Secondary | ICD-10-CM | POA: Diagnosis not present

## 2017-12-23 DIAGNOSIS — R635 Abnormal weight gain: Secondary | ICD-10-CM | POA: Diagnosis not present

## 2017-12-23 DIAGNOSIS — D3A8 Other benign neuroendocrine tumors: Secondary | ICD-10-CM | POA: Diagnosis not present

## 2017-12-23 DIAGNOSIS — R509 Fever, unspecified: Secondary | ICD-10-CM | POA: Diagnosis not present

## 2017-12-24 DIAGNOSIS — C259 Malignant neoplasm of pancreas, unspecified: Secondary | ICD-10-CM | POA: Diagnosis not present

## 2017-12-24 DIAGNOSIS — D3A8 Other benign neuroendocrine tumors: Secondary | ICD-10-CM | POA: Diagnosis not present

## 2017-12-25 DIAGNOSIS — D137 Benign neoplasm of endocrine pancreas: Secondary | ICD-10-CM | POA: Diagnosis not present

## 2018-01-07 ENCOUNTER — Encounter: Payer: Self-pay | Admitting: Gastroenterology

## 2018-01-07 ENCOUNTER — Ambulatory Visit (AMBULATORY_SURGERY_CENTER): Payer: Medicare Other | Admitting: Gastroenterology

## 2018-01-07 VITALS — BP 117/69 | HR 58 | Temp 98.6°F | Resp 13 | Ht 72.0 in | Wt 197.0 lb

## 2018-01-07 DIAGNOSIS — D123 Benign neoplasm of transverse colon: Secondary | ICD-10-CM | POA: Diagnosis not present

## 2018-01-07 DIAGNOSIS — K449 Diaphragmatic hernia without obstruction or gangrene: Secondary | ICD-10-CM

## 2018-01-07 DIAGNOSIS — K2970 Gastritis, unspecified, without bleeding: Secondary | ICD-10-CM | POA: Diagnosis not present

## 2018-01-07 DIAGNOSIS — R109 Unspecified abdominal pain: Secondary | ICD-10-CM | POA: Diagnosis not present

## 2018-01-07 DIAGNOSIS — R933 Abnormal findings on diagnostic imaging of other parts of digestive tract: Secondary | ICD-10-CM

## 2018-01-07 DIAGNOSIS — Z1211 Encounter for screening for malignant neoplasm of colon: Secondary | ICD-10-CM | POA: Diagnosis not present

## 2018-01-07 DIAGNOSIS — K2990 Gastroduodenitis, unspecified, without bleeding: Secondary | ICD-10-CM | POA: Diagnosis not present

## 2018-01-07 DIAGNOSIS — K3189 Other diseases of stomach and duodenum: Secondary | ICD-10-CM | POA: Diagnosis not present

## 2018-01-07 DIAGNOSIS — K5730 Diverticulosis of large intestine without perforation or abscess without bleeding: Secondary | ICD-10-CM

## 2018-01-07 DIAGNOSIS — R1030 Lower abdominal pain, unspecified: Secondary | ICD-10-CM

## 2018-01-07 MED ORDER — PANTOPRAZOLE SODIUM 40 MG PO TBEC: 40.0000 mg | DELAYED_RELEASE_TABLET | Freq: Two times a day (BID) | ORAL | 0 refills | Status: DC

## 2018-01-07 MED ORDER — SODIUM CHLORIDE 0.9 % IV SOLN: 500.0000 mL | Freq: Once | INTRAVENOUS | Status: DC

## 2018-01-07 NOTE — Patient Instructions (Signed)
Discharge instructions given. Handouts on polyps,diverticulosis,hemorrhoids,hiatal hernia and Gastritis. Resume previous medications. Use Protonix 40mg  by mouth twice a day for 6 weeks to promote mucosal healing. Then titrate to lowest effective dose or discontinue completely if symptoms resolved. YOU HAD AN ENDOSCOPIC PROCEDURE TODAY AT [facility]:   Refer to the procedure report that was given to you for any specific questions about what was found during the examination.  If the procedure report does not answer your questions, please call your gastroenterologist to clarify.  If you requested that your care partner not be given the details of your procedure findings, then the procedure report has been included in a sealed envelope for you to review at your convenience later.  YOU SHOULD EXPECT: Some feelings of bloating in the abdomen. Passage of more gas than usual.  Walking can help get rid of the air that was put into your GI tract during the procedure and reduce the bloating. If you had a lower endoscopy (such as a colonoscopy or flexible sigmoidoscopy) you may notice spotting of blood in your stool or on the toilet paper. If you underwent a bowel prep for your procedure, you may not have a normal bowel movement for a few days.  Please Note:  You might notice some irritation and congestion in your nose or some drainage.  This is from the oxygen used during your procedure.  There is no need for concern and it should clear up in a day or so.  SYMPTOMS TO REPORT IMMEDIATELY:   Following lower endoscopy (colonoscopy or flexible sigmoidoscopy):  Excessive amounts of blood in the stool  Significant tenderness or worsening of abdominal pains  Swelling of the abdomen that is new, acute  Fever of 100F or higher   Following upper endoscopy (EGD)  Vomiting of blood or coffee ground material  New chest pain or pain under the shoulder blades  Painful or persistently difficult  swallowing  New shortness of breath  Fever of 100F or higher  Black, tarry-looking stools  For urgent or emergent issues, a gastroenterologist can be reached at any hour by calling [phone].   DIET:  We do recommend a small meal at first, but then you may proceed to your regular diet.  Drink plenty of fluids but you should avoid alcoholic beverages for 24 hours.  ACTIVITY:  You should plan to take it easy for the rest of today and you should NOT DRIVE or use heavy machinery until tomorrow (because of the sedation medicines used during the test).    FOLLOW UP: Our staff will call the number listed on your records the next business day following your procedure to check on you and address any questions or concerns that you may have regarding the information given to you following your procedure. If we do not reach you, we will leave a message.  However, if you are feeling well and you are not experiencing any problems, there is no need to return our call.  We will assume that you have returned to your regular daily activities without incident.  If any biopsies were taken you will be contacted by phone or by letter within the next 1-3 weeks.  Please call us at [phone] if you have not heard about the biopsies in 3 weeks.    SIGNATURES/CONFIDENTIALITY: You and/or your care partner have signed paperwork which will be entered into your electronic medical record.  These signatures attest to the fact that that the information above  on your After Visit Summary has been reviewed and is understood.  Full responsibility of the confidentiality of this discharge information lies with you and/or your care-partner.

## 2018-01-07 NOTE — Op Note (Signed)
[place] Patient Name: Stephen Gross Procedure Date: 01/07/2018 1:22 PM MRN: [id number] Endoscopist: Gerrit Heck , MD Age: 72 Referring MD:  Date of Birth: 09-25-1945 Gender: Male Account #: [id number] Procedure:                Colonoscopy Indications:              Screening for colorectal malignant neoplasm. Last                            colonoscopy was in 2009. Medicines:                Monitored Anesthesia Care Procedure:                Pre-Anesthesia Assessment:                           - Prior to the procedure, a History and Physical                            was performed, and patient medications and                            allergies were reviewed. The patient's tolerance of                            previous anesthesia was also reviewed. The risks                            and benefits of the procedure and the sedation                            options and risks were discussed with the patient.                            All questions were answered, and informed consent                            was obtained. Prior Anticoagulants: The patient has                            taken no previous anticoagulant or antiplatelet                            agents. ASA Grade Assessment: II - A patient with                            mild systemic disease. After reviewing the risks                            and benefits, the patient was deemed in                            satisfactory condition to undergo the procedure.  After obtaining informed consent, the colonoscope                            was passed under direct vision. Throughout the                            procedure, the patient's blood pressure, pulse, and                            oxygen saturations were monitored continuously. The                            Colonoscope was introduced through the anus and                            advanced to the the terminal ileum.  The colonoscopy                            was performed without difficulty. The patient                            tolerated the procedure well. The quality of the                            bowel preparation was adequate. Scope In: 1:43:23 PM Scope Out: 1:58:07 PM Scope Withdrawal Time: 0 hours 11 minutes 56 seconds  Total Procedure Duration: 0 hours 14 minutes 44 seconds  Findings:                 The perianal and digital rectal examinations were                            normal.                           Two sessile polyps were found in the transverse                            colon. The polyps were 2 to 4 mm in size. These                            polyps were removed with a cold snare. Resection                            and retrieval were complete. Estimated blood loss                            was minimal.                           A few small-mouthed diverticula were found in the                            sigmoid colon and transverse colon.  Non-bleeding internal hemorrhoids were found during                            retroflexion. The hemorrhoids were small.                           The terminal ileum appeared normal. Complications:            No immediate complications. Estimated Blood Loss:     Estimated blood loss was minimal. Impression:               - Two 2 to 4 mm polyps in the transverse colon,                            removed with a cold snare. Resected and retrieved.                           - Diverticulosis in the sigmoid colon and in the                            transverse colon.                           - Non-bleeding internal hemorrhoids.                           - The examined portion of the ileum was normal. Recommendation:           - Patient has a contact number available for                            emergencies. The signs and symptoms of potential                            delayed complications were discussed with  the                            patient. Return to normal activities tomorrow.                            Written discharge instructions were provided to the                            patient.                           - Resume previous diet today.                           - Continue present medications.                           - Await pathology results.                           - Repeat colonoscopy in 5-10 years for surveillance  based on pathology results. Gerrit Heck, MD 01/07/2018 2:16:20 PM

## 2018-01-07 NOTE — Progress Notes (Signed)
To PACU, VSS. Report to [email or web address] 

## 2018-01-07 NOTE — Op Note (Signed)
[place] Patient Name: Stephen Gross Procedure Date: 01/07/2018 1:22 PM MRN: [id number] Endoscopist: Gerrit Heck , MD Age: 72 Referring MD:  Date of Birth: [birth year] Gender: Male Account #: [id number] Procedure:                Upper GI endoscopy Indications:              Abdominal pain in the right lower quadrant,                            Abdominal pain in the left lower quadrant                           72 yo male with a history of Insulinoma status post                            Whipple resection at [facility] in 2002                            presenting for endoscopic evaluation for LLQ>RLQ                            abdominal pain. History of anastamotic ulcer. Medicines:                Monitored Anesthesia Care Procedure:                Pre-Anesthesia Assessment:                           - Prior to the procedure, a History and Physical                            was performed, and patient medications and                            allergies were reviewed. The patient's tolerance of                            previous anesthesia was also reviewed. The risks                            and benefits of the procedure and the sedation                            options and risks were discussed with the patient.                            All questions were answered, and informed consent                            was obtained. Prior Anticoagulants: The patient has                            taken no previous anticoagulant or antiplatelet  agents. ASA Grade Assessment: II - A patient with                            mild systemic disease. After reviewing the risks                            and benefits, the patient was deemed in                            satisfactory condition to undergo the procedure.                           After obtaining informed consent, the endoscope was                            passed under direct  vision. Throughout the                            procedure, the patient's blood pressure, pulse, and                            oxygen saturations were monitored continuously. The                            Endoscope was introduced through the mouth, and                            advanced to the jejunum. The upper GI endoscopy was                            accomplished without difficulty. The patient                            tolerated the procedure well. Scope In: Scope Out: Findings:                 Esophagogastric landmarks were identified: the                            Z-line was found at 40 cm, the gastroesophageal                            junction was found at 40 cm and the site of hiatal                            narrowing was found at 42 cm from the incisors.                           A 2 cm hiatal hernia was present.                           The Z-line was regular and was found 40 cm from the  incisors.                           The upper third of the esophagus and middle third                            of the esophagus were normal.                           Evidence of a gastrojejunostomy was found in the                            gastric antrum. This was characterized by healthy                            appearing mucosa.                           Scattered mild inflammation characterized by                            erythema was found in the gastric fundus and in the                            gastric body. Biopsies were taken with a cold                            forceps for Helicobacter pylori testing. Estimated                            blood loss was minimal.                           The examined jejunum was normal. Biopsies were                            taken with a cold forceps for histology. Estimated                            blood loss was minimal. Complications:            No immediate complications. Estimated Blood  Loss:     Estimated blood loss was minimal. Impression:               - Esophagogastric landmarks identified.                           - 2 cm hiatal hernia.                           - Z-line regular, 40 cm from the incisors.                           - Normal upper third of esophagus and middle third  of esophagus.                           - A gastrojejunostomy was found, characterized by                            healthy appearing mucosa.                           - Gastritis. Biopsied.                           - Normal examined jejunum. Biopsied. Recommendation:           - Patient has a contact number available for                            emergencies. The signs and symptoms of potential                            delayed complications were discussed with the                            patient. Return to normal activities tomorrow.                            Written discharge instructions were provided to the                            patient.                           - Resume previous diet today.                           - Continue present medications.                           - Await pathology results.                           - Use Protonix (pantoprazole) 40 mg PO BID for 6                            weeks to promote mucosal healing, then titrate to                            lowest effective dose or discontinue completely if                            symptoms resolved. Gerrit Heck, MD 01/07/2018 2:12:50 PM

## 2018-01-07 NOTE — Progress Notes (Signed)
Called to room to assist during endoscopic procedure.  Patient ID and intended procedure confirmed with present staff. Received instructions for my participation in the procedure from the performing physician.  

## 2018-01-08 ENCOUNTER — Telehealth: Payer: Self-pay

## 2018-01-08 NOTE — Telephone Encounter (Signed)
  Follow up Call-  Call back number 01/07/2018  Post procedure Call Back phone  # [phone] or cell [phone]  Permission to leave phone message Yes     Patient questions:  Do you have a fever, pain , or abdominal swelling? No. Pain Score  0 *  Have you tolerated food without any problems? Yes.    Have you been able to return to your normal activities? Yes.    Do you have any questions about your discharge instructions: Diet   No. Medications  No. Follow up visit  No.  Do you have questions or concerns about your Care? No.  Actions: * If pain score is 4 or above: No action needed, pain <4.

## 2018-01-14 ENCOUNTER — Encounter: Payer: Self-pay | Admitting: Gastroenterology

## 2018-01-14 DIAGNOSIS — M2550 Pain in unspecified joint: Secondary | ICD-10-CM | POA: Diagnosis not present

## 2018-01-14 DIAGNOSIS — R030 Elevated blood-pressure reading, without diagnosis of hypertension: Secondary | ICD-10-CM | POA: Diagnosis not present

## 2018-01-14 DIAGNOSIS — R609 Edema, unspecified: Secondary | ICD-10-CM | POA: Diagnosis not present

## 2018-01-14 DIAGNOSIS — N049 Nephrotic syndrome with unspecified morphologic changes: Secondary | ICD-10-CM | POA: Diagnosis not present

## 2018-01-21 DIAGNOSIS — Z87891 Personal history of nicotine dependence: Secondary | ICD-10-CM | POA: Diagnosis not present

## 2018-01-21 DIAGNOSIS — N049 Nephrotic syndrome with unspecified morphologic changes: Secondary | ICD-10-CM | POA: Diagnosis not present

## 2018-01-21 DIAGNOSIS — D137 Benign neoplasm of endocrine pancreas: Secondary | ICD-10-CM | POA: Diagnosis not present

## 2018-01-21 DIAGNOSIS — R779 Abnormality of plasma protein, unspecified: Secondary | ICD-10-CM | POA: Diagnosis not present

## 2018-01-21 DIAGNOSIS — R801 Persistent proteinuria, unspecified: Secondary | ICD-10-CM | POA: Diagnosis not present

## 2018-01-21 DIAGNOSIS — N052 Unspecified nephritic syndrome with diffuse membranous glomerulonephritis: Secondary | ICD-10-CM | POA: Diagnosis not present

## 2018-01-21 DIAGNOSIS — I129 Hypertensive chronic kidney disease with stage 1 through stage 4 chronic kidney disease, or unspecified chronic kidney disease: Secondary | ICD-10-CM | POA: Diagnosis not present

## 2018-01-21 DIAGNOSIS — Z23 Encounter for immunization: Secondary | ICD-10-CM | POA: Diagnosis not present

## 2018-01-21 DIAGNOSIS — N182 Chronic kidney disease, stage 2 (mild): Secondary | ICD-10-CM | POA: Diagnosis not present

## 2018-01-28 DIAGNOSIS — N052 Unspecified nephritic syndrome with diffuse membranous glomerulonephritis: Secondary | ICD-10-CM | POA: Diagnosis not present

## 2018-01-28 DIAGNOSIS — D137 Benign neoplasm of endocrine pancreas: Secondary | ICD-10-CM | POA: Diagnosis not present

## 2018-01-28 DIAGNOSIS — R911 Solitary pulmonary nodule: Secondary | ICD-10-CM | POA: Diagnosis not present

## 2018-01-28 DIAGNOSIS — Z90411 Acquired partial absence of pancreas: Secondary | ICD-10-CM | POA: Diagnosis not present

## 2018-01-28 DIAGNOSIS — R779 Abnormality of plasma protein, unspecified: Secondary | ICD-10-CM | POA: Diagnosis not present

## 2018-01-28 DIAGNOSIS — N022 Recurrent and persistent hematuria with diffuse membranous glomerulonephritis: Secondary | ICD-10-CM | POA: Diagnosis not present

## 2018-02-13 DIAGNOSIS — E782 Mixed hyperlipidemia: Secondary | ICD-10-CM | POA: Diagnosis not present

## 2018-02-13 DIAGNOSIS — Z9189 Other specified personal risk factors, not elsewhere classified: Secondary | ICD-10-CM | POA: Diagnosis not present

## 2018-02-13 DIAGNOSIS — N049 Nephrotic syndrome with unspecified morphologic changes: Secondary | ICD-10-CM | POA: Diagnosis not present

## 2018-02-13 DIAGNOSIS — N052 Unspecified nephritic syndrome with diffuse membranous glomerulonephritis: Secondary | ICD-10-CM | POA: Diagnosis not present

## 2018-02-13 DIAGNOSIS — M353 Polymyalgia rheumatica: Secondary | ICD-10-CM | POA: Diagnosis not present

## 2018-02-25 ENCOUNTER — Other Ambulatory Visit: Payer: Self-pay

## 2018-02-25 DIAGNOSIS — N052 Unspecified nephritic syndrome with diffuse membranous glomerulonephritis: Secondary | ICD-10-CM | POA: Diagnosis not present

## 2018-02-25 DIAGNOSIS — E7800 Pure hypercholesterolemia, unspecified: Secondary | ICD-10-CM | POA: Diagnosis not present

## 2018-02-25 DIAGNOSIS — N049 Nephrotic syndrome with unspecified morphologic changes: Secondary | ICD-10-CM | POA: Diagnosis not present

## 2018-02-25 DIAGNOSIS — R801 Persistent proteinuria, unspecified: Secondary | ICD-10-CM | POA: Diagnosis not present

## 2018-02-25 DIAGNOSIS — A689 Relapsing fever, unspecified: Secondary | ICD-10-CM | POA: Diagnosis not present

## 2018-02-25 DIAGNOSIS — N182 Chronic kidney disease, stage 2 (mild): Secondary | ICD-10-CM | POA: Diagnosis not present

## 2018-02-25 DIAGNOSIS — I129 Hypertensive chronic kidney disease with stage 1 through stage 4 chronic kidney disease, or unspecified chronic kidney disease: Secondary | ICD-10-CM | POA: Diagnosis not present

## 2018-02-25 DIAGNOSIS — Z113 Encounter for screening for infections with a predominantly sexual mode of transmission: Secondary | ICD-10-CM | POA: Diagnosis not present

## 2018-02-27 DIAGNOSIS — H2513 Age-related nuclear cataract, bilateral: Secondary | ICD-10-CM | POA: Diagnosis not present

## 2018-03-24 DIAGNOSIS — N052 Unspecified nephritic syndrome with diffuse membranous glomerulonephritis: Secondary | ICD-10-CM | POA: Diagnosis not present

## 2018-03-25 DIAGNOSIS — R801 Persistent proteinuria, unspecified: Secondary | ICD-10-CM | POA: Diagnosis not present

## 2018-03-25 DIAGNOSIS — N052 Unspecified nephritic syndrome with diffuse membranous glomerulonephritis: Secondary | ICD-10-CM | POA: Diagnosis not present

## 2018-03-25 DIAGNOSIS — N049 Nephrotic syndrome with unspecified morphologic changes: Secondary | ICD-10-CM | POA: Diagnosis not present

## 2018-04-07 DIAGNOSIS — N052 Unspecified nephritic syndrome with diffuse membranous glomerulonephritis: Secondary | ICD-10-CM | POA: Diagnosis not present

## 2018-04-10 DIAGNOSIS — Z7952 Long term (current) use of systemic steroids: Secondary | ICD-10-CM | POA: Diagnosis not present

## 2018-04-10 DIAGNOSIS — E785 Hyperlipidemia, unspecified: Secondary | ICD-10-CM | POA: Diagnosis present

## 2018-04-10 DIAGNOSIS — Z79899 Other long term (current) drug therapy: Secondary | ICD-10-CM | POA: Diagnosis not present

## 2018-04-10 DIAGNOSIS — N052 Unspecified nephritic syndrome with diffuse membranous glomerulonephritis: Secondary | ICD-10-CM | POA: Diagnosis present

## 2018-04-10 DIAGNOSIS — R600 Localized edema: Secondary | ICD-10-CM | POA: Diagnosis not present

## 2018-04-10 DIAGNOSIS — R7989 Other specified abnormal findings of blood chemistry: Secondary | ICD-10-CM | POA: Diagnosis present

## 2018-04-10 DIAGNOSIS — N400 Enlarged prostate without lower urinary tract symptoms: Secondary | ICD-10-CM | POA: Diagnosis present

## 2018-04-10 DIAGNOSIS — R0602 Shortness of breath: Secondary | ICD-10-CM | POA: Diagnosis not present

## 2018-04-10 DIAGNOSIS — I10 Essential (primary) hypertension: Secondary | ICD-10-CM | POA: Diagnosis present

## 2018-04-10 DIAGNOSIS — R05 Cough: Secondary | ICD-10-CM | POA: Diagnosis not present

## 2018-04-10 DIAGNOSIS — E8809 Other disorders of plasma-protein metabolism, not elsewhere classified: Secondary | ICD-10-CM | POA: Diagnosis present

## 2018-04-10 DIAGNOSIS — R079 Chest pain, unspecified: Secondary | ICD-10-CM | POA: Diagnosis not present

## 2018-04-10 DIAGNOSIS — I2699 Other pulmonary embolism without acute cor pulmonale: Secondary | ICD-10-CM | POA: Diagnosis not present

## 2018-04-10 DIAGNOSIS — I2693 Single subsegmental pulmonary embolism without acute cor pulmonale: Secondary | ICD-10-CM | POA: Diagnosis not present

## 2018-04-11 DIAGNOSIS — R600 Localized edema: Secondary | ICD-10-CM | POA: Diagnosis not present

## 2018-04-11 DIAGNOSIS — I2699 Other pulmonary embolism without acute cor pulmonale: Secondary | ICD-10-CM | POA: Diagnosis not present

## 2018-04-15 DIAGNOSIS — R801 Persistent proteinuria, unspecified: Secondary | ICD-10-CM | POA: Diagnosis not present

## 2018-04-15 DIAGNOSIS — N022 Recurrent and persistent hematuria with diffuse membranous glomerulonephritis: Secondary | ICD-10-CM | POA: Diagnosis not present

## 2018-04-15 DIAGNOSIS — N052 Unspecified nephritic syndrome with diffuse membranous glomerulonephritis: Secondary | ICD-10-CM | POA: Diagnosis not present

## 2018-04-15 DIAGNOSIS — Z9889 Other specified postprocedural states: Secondary | ICD-10-CM | POA: Diagnosis not present

## 2018-04-15 DIAGNOSIS — I2699 Other pulmonary embolism without acute cor pulmonale: Secondary | ICD-10-CM | POA: Diagnosis not present

## 2018-04-15 DIAGNOSIS — N049 Nephrotic syndrome with unspecified morphologic changes: Secondary | ICD-10-CM | POA: Diagnosis not present

## 2018-04-15 DIAGNOSIS — Z86711 Personal history of pulmonary embolism: Secondary | ICD-10-CM | POA: Diagnosis not present

## 2018-05-01 DIAGNOSIS — N052 Unspecified nephritic syndrome with diffuse membranous glomerulonephritis: Secondary | ICD-10-CM | POA: Diagnosis not present

## 2018-05-01 DIAGNOSIS — N049 Nephrotic syndrome with unspecified morphologic changes: Secondary | ICD-10-CM | POA: Diagnosis not present

## 2018-05-01 DIAGNOSIS — Z87891 Personal history of nicotine dependence: Secondary | ICD-10-CM | POA: Diagnosis not present

## 2018-05-01 DIAGNOSIS — Z79899 Other long term (current) drug therapy: Secondary | ICD-10-CM | POA: Diagnosis not present

## 2018-05-01 DIAGNOSIS — R768 Other specified abnormal immunological findings in serum: Secondary | ICD-10-CM | POA: Diagnosis not present

## 2018-05-01 DIAGNOSIS — Z5181 Encounter for therapeutic drug level monitoring: Secondary | ICD-10-CM | POA: Diagnosis not present

## 2018-05-20 DIAGNOSIS — N022 Recurrent and persistent hematuria with diffuse membranous glomerulonephritis: Secondary | ICD-10-CM | POA: Diagnosis not present

## 2018-06-10 DIAGNOSIS — Z7952 Long term (current) use of systemic steroids: Secondary | ICD-10-CM | POA: Diagnosis not present

## 2018-06-10 DIAGNOSIS — I2699 Other pulmonary embolism without acute cor pulmonale: Secondary | ICD-10-CM | POA: Diagnosis not present

## 2018-06-10 DIAGNOSIS — N052 Unspecified nephritic syndrome with diffuse membranous glomerulonephritis: Secondary | ICD-10-CM | POA: Diagnosis not present

## 2018-06-10 DIAGNOSIS — N049 Nephrotic syndrome with unspecified morphologic changes: Secondary | ICD-10-CM | POA: Diagnosis not present

## 2018-06-10 DIAGNOSIS — R768 Other specified abnormal immunological findings in serum: Secondary | ICD-10-CM | POA: Diagnosis not present

## 2018-07-01 DIAGNOSIS — N022 Recurrent and persistent hematuria with diffuse membranous glomerulonephritis: Secondary | ICD-10-CM | POA: Diagnosis not present

## 2018-09-02 DIAGNOSIS — L578 Other skin changes due to chronic exposure to nonionizing radiation: Secondary | ICD-10-CM | POA: Diagnosis not present

## 2018-09-02 DIAGNOSIS — L821 Other seborrheic keratosis: Secondary | ICD-10-CM | POA: Diagnosis not present

## 2018-09-02 DIAGNOSIS — Z7952 Long term (current) use of systemic steroids: Secondary | ICD-10-CM | POA: Diagnosis not present

## 2018-09-16 DIAGNOSIS — R61 Generalized hyperhidrosis: Secondary | ICD-10-CM | POA: Diagnosis not present

## 2018-09-16 DIAGNOSIS — N022 Recurrent and persistent hematuria with diffuse membranous glomerulonephritis: Secondary | ICD-10-CM | POA: Diagnosis not present

## 2018-09-16 DIAGNOSIS — N049 Nephrotic syndrome with unspecified morphologic changes: Secondary | ICD-10-CM | POA: Diagnosis not present

## 2018-09-23 DIAGNOSIS — R801 Persistent proteinuria, unspecified: Secondary | ICD-10-CM | POA: Diagnosis not present

## 2018-09-23 DIAGNOSIS — N022 Recurrent and persistent hematuria with diffuse membranous glomerulonephritis: Secondary | ICD-10-CM | POA: Diagnosis not present

## 2018-10-27 DIAGNOSIS — Z7952 Long term (current) use of systemic steroids: Secondary | ICD-10-CM | POA: Diagnosis not present

## 2018-10-27 DIAGNOSIS — Z79899 Other long term (current) drug therapy: Secondary | ICD-10-CM | POA: Diagnosis not present

## 2018-10-27 DIAGNOSIS — R509 Fever, unspecified: Secondary | ICD-10-CM | POA: Diagnosis not present

## 2018-10-27 DIAGNOSIS — R531 Weakness: Secondary | ICD-10-CM | POA: Diagnosis not present

## 2018-10-27 DIAGNOSIS — Z86711 Personal history of pulmonary embolism: Secondary | ICD-10-CM | POA: Diagnosis not present

## 2018-10-27 DIAGNOSIS — Z7901 Long term (current) use of anticoagulants: Secondary | ICD-10-CM | POA: Diagnosis not present

## 2018-10-27 DIAGNOSIS — M1990 Unspecified osteoarthritis, unspecified site: Secondary | ICD-10-CM | POA: Diagnosis not present

## 2018-10-27 DIAGNOSIS — B349 Viral infection, unspecified: Secondary | ICD-10-CM | POA: Diagnosis not present

## 2018-11-05 DIAGNOSIS — M353 Polymyalgia rheumatica: Secondary | ICD-10-CM | POA: Diagnosis not present

## 2018-11-05 DIAGNOSIS — N049 Nephrotic syndrome with unspecified morphologic changes: Secondary | ICD-10-CM | POA: Diagnosis not present

## 2018-11-05 DIAGNOSIS — N052 Unspecified nephritic syndrome with diffuse membranous glomerulonephritis: Secondary | ICD-10-CM | POA: Diagnosis not present

## 2018-11-05 DIAGNOSIS — R509 Fever, unspecified: Secondary | ICD-10-CM | POA: Diagnosis not present

## 2018-11-19 DIAGNOSIS — D3A8 Other benign neuroendocrine tumors: Secondary | ICD-10-CM | POA: Diagnosis not present

## 2018-11-19 DIAGNOSIS — D137 Benign neoplasm of endocrine pancreas: Secondary | ICD-10-CM | POA: Diagnosis not present

## 2018-12-12 DIAGNOSIS — K769 Liver disease, unspecified: Secondary | ICD-10-CM | POA: Diagnosis not present

## 2018-12-30 DIAGNOSIS — N049 Nephrotic syndrome with unspecified morphologic changes: Secondary | ICD-10-CM | POA: Diagnosis not present

## 2018-12-30 DIAGNOSIS — Z23 Encounter for immunization: Secondary | ICD-10-CM | POA: Diagnosis not present

## 2018-12-30 DIAGNOSIS — N022 Recurrent and persistent hematuria with diffuse membranous glomerulonephritis: Secondary | ICD-10-CM | POA: Diagnosis not present

## 2018-12-30 DIAGNOSIS — N052 Unspecified nephritic syndrome with diffuse membranous glomerulonephritis: Secondary | ICD-10-CM | POA: Diagnosis not present

## 2018-12-30 DIAGNOSIS — R801 Persistent proteinuria, unspecified: Secondary | ICD-10-CM | POA: Diagnosis not present

## 2019-01-05 DIAGNOSIS — R1030 Lower abdominal pain, unspecified: Secondary | ICD-10-CM | POA: Diagnosis not present

## 2019-01-05 DIAGNOSIS — R5383 Other fatigue: Secondary | ICD-10-CM | POA: Diagnosis not present

## 2019-01-05 DIAGNOSIS — R1032 Left lower quadrant pain: Secondary | ICD-10-CM | POA: Diagnosis not present

## 2019-01-05 DIAGNOSIS — R3129 Other microscopic hematuria: Secondary | ICD-10-CM | POA: Diagnosis not present

## 2019-01-06 DIAGNOSIS — K4020 Bilateral inguinal hernia, without obstruction or gangrene, not specified as recurrent: Secondary | ICD-10-CM | POA: Diagnosis not present

## 2019-01-06 DIAGNOSIS — K5730 Diverticulosis of large intestine without perforation or abscess without bleeding: Secondary | ICD-10-CM | POA: Diagnosis not present

## 2019-01-06 DIAGNOSIS — K4090 Unilateral inguinal hernia, without obstruction or gangrene, not specified as recurrent: Secondary | ICD-10-CM | POA: Diagnosis not present

## 2019-01-06 DIAGNOSIS — M47816 Spondylosis without myelopathy or radiculopathy, lumbar region: Secondary | ICD-10-CM | POA: Diagnosis not present

## 2019-01-06 DIAGNOSIS — R1032 Left lower quadrant pain: Secondary | ICD-10-CM | POA: Diagnosis not present

## 2019-01-13 DIAGNOSIS — K811 Chronic cholecystitis: Secondary | ICD-10-CM | POA: Diagnosis not present

## 2019-01-13 DIAGNOSIS — D137 Benign neoplasm of endocrine pancreas: Secondary | ICD-10-CM | POA: Diagnosis not present

## 2019-01-13 DIAGNOSIS — C7A8 Other malignant neuroendocrine tumors: Secondary | ICD-10-CM | POA: Diagnosis not present

## 2019-01-16 DIAGNOSIS — K4090 Unilateral inguinal hernia, without obstruction or gangrene, not specified as recurrent: Secondary | ICD-10-CM | POA: Diagnosis not present

## 2019-01-16 DIAGNOSIS — Z7902 Long term (current) use of antithrombotics/antiplatelets: Secondary | ICD-10-CM | POA: Diagnosis not present

## 2019-01-16 DIAGNOSIS — Z86711 Personal history of pulmonary embolism: Secondary | ICD-10-CM | POA: Diagnosis not present

## 2019-01-26 DIAGNOSIS — N052 Unspecified nephritic syndrome with diffuse membranous glomerulonephritis: Secondary | ICD-10-CM | POA: Diagnosis not present

## 2019-01-27 DIAGNOSIS — D137 Benign neoplasm of endocrine pancreas: Secondary | ICD-10-CM | POA: Diagnosis not present

## 2019-02-03 DIAGNOSIS — N052 Unspecified nephritic syndrome with diffuse membranous glomerulonephritis: Secondary | ICD-10-CM | POA: Diagnosis not present

## 2019-02-03 DIAGNOSIS — M160 Bilateral primary osteoarthritis of hip: Secondary | ICD-10-CM | POA: Diagnosis not present

## 2019-02-03 DIAGNOSIS — M8588 Other specified disorders of bone density and structure, other site: Secondary | ICD-10-CM | POA: Diagnosis not present

## 2019-02-03 DIAGNOSIS — Z87891 Personal history of nicotine dependence: Secondary | ICD-10-CM | POA: Diagnosis not present

## 2019-02-03 DIAGNOSIS — M25551 Pain in right hip: Secondary | ICD-10-CM | POA: Diagnosis not present

## 2019-02-03 DIAGNOSIS — M8580 Other specified disorders of bone density and structure, unspecified site: Secondary | ICD-10-CM | POA: Diagnosis not present

## 2019-02-03 DIAGNOSIS — M8700 Idiopathic aseptic necrosis of unspecified bone: Secondary | ICD-10-CM | POA: Diagnosis not present

## 2019-02-03 DIAGNOSIS — N049 Nephrotic syndrome with unspecified morphologic changes: Secondary | ICD-10-CM | POA: Diagnosis not present

## 2019-02-03 DIAGNOSIS — E785 Hyperlipidemia, unspecified: Secondary | ICD-10-CM | POA: Diagnosis not present

## 2019-02-03 DIAGNOSIS — M25552 Pain in left hip: Secondary | ICD-10-CM | POA: Diagnosis not present

## 2019-02-03 DIAGNOSIS — R768 Other specified abnormal immunological findings in serum: Secondary | ICD-10-CM | POA: Diagnosis not present

## 2019-02-03 DIAGNOSIS — I10 Essential (primary) hypertension: Secondary | ICD-10-CM | POA: Diagnosis not present

## 2019-02-11 DIAGNOSIS — Z23 Encounter for immunization: Secondary | ICD-10-CM | POA: Diagnosis not present

## 2019-02-11 DIAGNOSIS — S6991XA Unspecified injury of right wrist, hand and finger(s), initial encounter: Secondary | ICD-10-CM | POA: Diagnosis not present

## 2019-02-11 DIAGNOSIS — S61411A Laceration without foreign body of right hand, initial encounter: Secondary | ICD-10-CM | POA: Diagnosis not present

## 2019-02-13 DIAGNOSIS — Z23 Encounter for immunization: Secondary | ICD-10-CM | POA: Diagnosis not present

## 2019-02-18 ENCOUNTER — Ambulatory Visit: Payer: Self-pay | Admitting: General Surgery

## 2019-02-18 NOTE — H&P (View-Only) (Signed)
Stephen Gross Documented: 01/16/2019 8:46 AM Location: [facility] Patient #: [id number] DOB: 1945/08/14 Married / Language: Cleophus Molt / Race: White Male   History of Present Illness Stephen Hiss M. Tkeya Stencil MD; 01/16/2019 9:44 AM) The patient is a 73 year old male who presents with an inguinal hernia. He is referred by Dr. Alroy Dust for evaluation of a left inguinal hernia. He states a few weeks ago he started having some ache in his left groin. He would bother him at times. He also noticed a marble size lump in his left groin area. The lump is fairly constant. The ache is now constant. It bothers him may be when he has a cough as well. He denies any burning, shooting or stabbing sensation. He has had a prior midline incision for Whipple for insulinoma. He is also at least 2 pulmonary emboli most recent in January 2020 and is now on Xarelto lifelong. He states his blood clots or due to his protein in his urine. He is not aware if he has been tested for hypercoagulable disorder. He has membranous glomerulus nephritis and is just finishing a steroid taper today. He has an upcoming appointment at [facility] in a week or 2 to get rituximab. He does not smoke. He denies any chest pain, chest pressure, shortness of breath, orthopnea, TIAs. He had a CT scan which showed bilateral fat-containing inguinal hernias the left side also had a portion of sigmoid colon.   Problem List/Past Medical Stephen Hiss M. [name], MD; 01/16/2019 9:44 AM) HISTORY OF PULMONARY EMBOLISM (Z86.711)  LEFT THYROID NODULE (E04.1)  ANTIPLATELET OR ANTITHROMBOTIC LONG-TERM USE (Z79.02)  LEFT INGUINAL HERNIA (K40.90)   Past Surgical History Stephen Hiss M. [name], MD; 01/16/2019 9:44 AM) Colon Polyp Removal - Colonoscopy  Colon Removal - Partial  Pancreas Surgery  Tonsillectomy   Diagnostic Studies History Stephen Hiss M. [name], MD; 01/16/2019 9:44 AM) Colonoscopy  1-5 years ago  Allergies (Tanisha A. Owens Shark, [place]; 01/16/2019 8:47  AM) NSAIDs  Allergies Reconciled   Medication History (Tanisha A. Owens Shark, [place]; 01/16/2019 8:56 AM) Tamsulosin HCl (0.4MG  Capsule, Oral) Active. Valsartan (320MG  Tablet, Oral) Active. Furosemide (20MG  Tablet, Oral) Active. Atorvastatin Calcium (40MG  Tablet, Oral) Active. Tylenol Extra Strength (500MG  Tablet, Oral) Active. Xarelto (20MG  Tablet, Oral) Active. Medications Reconciled  Social History Stephen Hiss M. [name], MD; 01/16/2019 9:44 AM) Tobacco use  Former smoker. Alcohol use  Moderate alcohol use. Caffeine use  Coffee.  Family History Stephen Hiss M. [name], MD; 01/16/2019 9:44 AM) Arthritis  Mother. Cancer  Family Members In General. Hypertension  Father, Mother. Migraine Headache  Daughter, Mother. Respiratory Condition  Father. Cerebrovascular Accident  Daughter. Heart Disease  Father, Mother. Thyroid problems  Mother, Son.  Other Problems Stephen Hiss M. [name], MD; 01/16/2019 9:44 AM) Pancreatitis  Arthritis  Back Pain  Enlarged Prostate  Hemorrhoids  Pancreatic Cancer  Pulmonary Embolism / Blood Clot in Legs  Thyroid Disease  Transfusion history     Review of Systems Stephen Hiss M. Kourtney Terriquez MD; 01/16/2019 9:44 AM) HEENT Present- Hearing Loss. Not Present- Earache, Hoarseness, Nose Bleed, Oral Ulcers, Ringing in the Ears, Seasonal Allergies, Sinus Pain, Sore Throat, Visual Disturbances, Wears glasses/contact lenses and Yellow Eyes. Cardiovascular Present- Swelling of Extremities. Not Present- Chest Pain, Difficulty Breathing Lying Down, Leg Cramps, Palpitations, Rapid Heart Rate and Shortness of Breath. Neurological Present- Headaches. Not Present- Decreased Memory, Fainting, Numbness, Seizures, Tingling, Tremor, Trouble walking and Weakness. All other systems negative  Vitals (Tanisha A. Brown RMA; 01/16/2019 8:47 AM) 01/16/2019 8:47 AM Weight: 195.2 lb Height: 73in  Body Surface Area: 2.13 m Body Mass Index: 25.75 kg/m  Temp.: 97.75F  Pulse: 75  (Regular)  BP: 126/84 (Sitting, Left Arm, Standard)       Physical Exam Stephen Hiss M. Belmont Valli MD; 01/16/2019 9:44 AM) General Mental Status-Alert. General Appearance-Consistent with stated age. Hydration-Well hydrated. Voice-Normal.  Head and Neck Head-normocephalic, atraumatic with no lesions or palpable masses. Trachea-midline. Thyroid Gland Characteristics - normal size and consistency.  Eye Eyeball - Bilateral-Extraocular movements intact. Sclera/Conjunctiva - Bilateral-No scleral icterus.  Chest and Lung Exam Chest and lung exam reveals -quiet, even and easy respiratory effort with no use of accessory muscles and on auscultation, normal breath sounds, no adventitious sounds and normal vocal resonance. Inspection Chest Wall - Normal. Back - normal.  Breast - Did not examine.  Cardiovascular Cardiovascular examination reveals -normal heart sounds, regular rate and rhythm with no murmurs and normal pedal pulses bilaterally.  Abdomen Inspection Inspection of the abdomen reveals - No Hernias. Skin - Scar - Note: Midline incision. Palpation/Percussion Palpation and Percussion of the abdomen reveal - Soft, Non Tender, No Rebound tenderness, No Rigidity (guarding) and No hepatosplenomegaly. Auscultation Auscultation of the abdomen reveals - Bowel sounds normal.  Male Genitourinary Note: Both testicles descended. No scrotal or testicular masses. Palpable bulge in left groin with Valsalva. No bulge or impulse in right groin with Valsalva maneuvers   Peripheral Vascular Upper Extremity Palpation - Pulses bilaterally normal.  Neurologic Neurologic evaluation reveals -alert and oriented x 3 with no impairment of recent or remote memory. Mental Status-Normal.  Neuropsychiatric The patient's mood and affect are described as -normal. Judgment and Insight-insight is appropriate concerning matters relevant to self.  Musculoskeletal Normal Exam -  Left-Upper Extremity Strength Normal and Lower Extremity Strength Normal. Normal Exam - Right-Upper Extremity Strength Normal and Lower Extremity Strength Normal.  Lymphatic Head & Neck  General Head & Neck Lymphatics: Bilateral - Description - Normal. Axillary - Did not examine. Femoral & Inguinal - Did not examine.    Assessment & Plan Stephen Hiss M. Dakai Braithwaite MD; 01/16/2019 9:45 AM) LEFT INGUINAL HERNIA (K40.90) Impression: We discussed the etiology of inguinal hernias. We discussed the signs & symptoms of incarceration & strangulation. We discussed non-operative and operative management. Even tho his CT report showed bilateral fat-containing inguinal hernias with the left side also a portion the sigmoid colon he is only symptomatic on the left side. I cannot detect a inguinal hernia with the right side. He has no symptoms of right side. I do not believe he is a good candidate for laparoscopic approach because of his prior major abdominal surgery and for his prior pulmonary emboli. I think with the laparoscopic approach he would be at higher risk for a large postoperative hematoma therefore have recommended an open approach and only operating on the symptomatic side the left side. It is possible the fat-containing hernia on the right side could just simply be a cord lipoma  The patient has elected to proceed with open repair of left inguinal hernia with mesh   I described the procedure in detail. The patient was given educational material. We discussed the risks and benefits including but not limited to bleeding, infection, chronic inguinal pain, nerve entrapment, hernia recurrence, mesh complications, hematoma formation, urinary retention, injury to the testicles or the ovaries, numbness in the groin, blood clots, injury to the surrounding structures, and anesthesia risk. We also discussed the typical post operative recovery course, including no heavy lifting for 4-6 weeks. I explained that the  likelihood of improvement  of their symptoms is good. We did discuss that he is at increased risk for bleeding and hematoma due to the need for his ongoing blood thinning postoperatively. We also discussed that he is at higher risk for recurrent pulmonary emboli given his history of pulmonary embolism  I have recommended that we wait at least 6-8 weeks since he just finished a course of prednisone. He is also scheduled to get a rituximab infusion in a week or 27 also like to w Current Plans Pt Education - Pamphlet Given - Hernia Surgery: discussed with patient and provided information. You are being scheduled for surgery- Our schedulers will call you.  You should hear from our office's scheduling department within 5 working days about the location, date, and time of surgery. We try to make accommodations for patient's preferences in scheduling surgery, but sometimes the OR schedule or the surgeon's schedule prevents [place] from making those accommodations.  If you have not heard from our office [phone]) in 5 working days, call the office and ask for your surgeon's nurse.  If you have other questions about your diagnosis, plan, or surgery, call the office and ask for your surgeon's nurse.  HISTORY OF PULMONARY EMBOLISM (Z86.711) ANTIPLATELET OR ANTITHROMBOTIC LONG-TERM USE (Z79.02) Impression: We will ask his primary care physician whether or not they feel the patient needs to be bridged on therapeutic Lovenox perioperatively. Initially my plan is to hold the Xarelto 2 days before surgery. We will give him 5000 units of subcutaneous heparin preoperatively. And probably resume his Xarelto on postoperative day 1 or 2 depending on intraoperative findings  [name]. [name], MD, FACS General, Bariatric, & Minimally Invasive Surgery [facility], [place]

## 2019-02-18 NOTE — H&P (Signed)
Stephen Gross Documented: 01/16/2019 8:46 AM Location: [facility] Patient #: [id number] DOB: [birth year] Married / Language: Stephen Gross / Race: White Male   History of Present Illness Stephen Hiss M. Aylani Spurlock MD; 01/16/2019 9:44 AM) The patient is a 73 year old male who presents with an inguinal hernia. He is referred by Dr. Alroy Dust for evaluation of a left inguinal hernia. He states a few weeks ago he started having some ache in his left groin. He would bother him at times. He also noticed a marble size lump in his left groin area. The lump is fairly constant. The ache is now constant. It bothers him may be when he has a cough as well. He denies any burning, shooting or stabbing sensation. He has had a prior midline incision for Whipple for insulinoma. He is also at least 2 pulmonary emboli most recent in January 2020 and is now on Xarelto lifelong. He states his blood clots or due to his protein in his urine. He is not aware if he has been tested for hypercoagulable disorder. He has membranous glomerulus nephritis and is just finishing a steroid taper today. He has an upcoming appointment at [facility] in a week or 2 to get rituximab. He does not smoke. He denies any chest pain, chest pressure, shortness of breath, orthopnea, TIAs. He had a CT scan which showed bilateral fat-containing inguinal hernias the left side also had a portion of sigmoid colon.   Problem List/Past Medical Stephen Hiss M. [name], MD; 01/16/2019 9:44 AM) HISTORY OF PULMONARY EMBOLISM (Z86.711)  LEFT THYROID NODULE (E04.1)  ANTIPLATELET OR ANTITHROMBOTIC LONG-TERM USE (Z79.02)  LEFT INGUINAL HERNIA (K40.90)   Past Surgical History Stephen Hiss M. [name], MD; 01/16/2019 9:44 AM) Colon Polyp Removal - Colonoscopy  Colon Removal - Partial  Pancreas Surgery  Tonsillectomy   Diagnostic Studies History Stephen Hiss M. [name], MD; 01/16/2019 9:44 AM) Colonoscopy  1-5 years ago  Allergies (Stephen Gross, [place]; 01/16/2019 8:47  AM) NSAIDs  Allergies Reconciled   Medication History (Stephen Gross, [place]; 01/16/2019 8:56 AM) Tamsulosin HCl (0.4MG  Capsule, Oral) Active. Valsartan (320MG  Tablet, Oral) Active. Furosemide (20MG  Tablet, Oral) Active. Atorvastatin Calcium (40MG  Tablet, Oral) Active. Tylenol Extra Strength (500MG  Tablet, Oral) Active. Xarelto (20MG  Tablet, Oral) Active. Medications Reconciled  Social History Stephen Hiss M. [name], MD; 01/16/2019 9:44 AM) Tobacco use  Former smoker. Alcohol use  Moderate alcohol use. Caffeine use  Coffee.  Family History Stephen Hiss M. [name], MD; 01/16/2019 9:44 AM) Arthritis  Mother. Cancer  Family Members In General. Hypertension  Father, Mother. Migraine Headache  Daughter, Mother. Respiratory Condition  Father. Cerebrovascular Accident  Daughter. Heart Disease  Father, Mother. Thyroid problems  Mother, Son.  Other Problems Stephen Hiss M. [name], MD; 01/16/2019 9:44 AM) Pancreatitis  Arthritis  Back Pain  Enlarged Prostate  Hemorrhoids  Pancreatic Cancer  Pulmonary Embolism / Blood Clot in Legs  Thyroid Disease  Transfusion history     Review of Systems Stephen Hiss M. Stephen Coye MD; 01/16/2019 9:44 AM) HEENT Present- Hearing Loss. Not Present- Earache, Hoarseness, Nose Bleed, Oral Ulcers, Ringing in the Ears, Seasonal Allergies, Sinus Pain, Sore Throat, Visual Disturbances, Wears glasses/contact lenses and Yellow Eyes. Cardiovascular Present- Swelling of Extremities. Not Present- Chest Pain, Difficulty Breathing Lying Down, Leg Cramps, Palpitations, Rapid Heart Rate and Shortness of Breath. Neurological Present- Headaches. Not Present- Decreased Memory, Fainting, Numbness, Seizures, Tingling, Tremor, Trouble walking and Weakness. All other systems negative  Vitals (Stephen Gross; 01/16/2019 8:47 AM) 01/16/2019 8:47 AM Weight: 195.2 lb Height: 73in  Body Surface Area: 2.13 m Body Mass Index: 25.75 kg/m  Temp.: 97.58F  Pulse: 75  (Regular)  BP: 126/84 (Sitting, Left Arm, Standard)       Physical Exam Stephen Hiss M. Lexington Devine MD; 01/16/2019 9:44 AM) General Mental Status-Alert. General Appearance-Consistent with stated age. Hydration-Well hydrated. Voice-Normal.  Head and Neck Head-normocephalic, atraumatic with no lesions or palpable masses. Trachea-midline. Thyroid Gland Characteristics - normal size and consistency.  Eye Eyeball - Bilateral-Extraocular movements intact. Sclera/Conjunctiva - Bilateral-No scleral icterus.  Chest and Lung Exam Chest and lung exam reveals -quiet, even and easy respiratory effort with no use of accessory muscles and on auscultation, normal breath sounds, no adventitious sounds and normal vocal resonance. Inspection Chest Wall - Normal. Back - normal.  Breast - Did not examine.  Cardiovascular Cardiovascular examination reveals -normal heart sounds, regular rate and rhythm with no murmurs and normal pedal pulses bilaterally.  Abdomen Inspection Inspection of the abdomen reveals - No Hernias. Skin - Scar - Note: Midline incision. Palpation/Percussion Palpation and Percussion of the abdomen reveal - Soft, Non Tender, No Rebound tenderness, No Rigidity (guarding) and No hepatosplenomegaly. Auscultation Auscultation of the abdomen reveals - Bowel sounds normal.  Male Genitourinary Note: Both testicles descended. No scrotal or testicular masses. Palpable bulge in left groin with Valsalva. No bulge or impulse in right groin with Valsalva maneuvers   Peripheral Vascular Upper Extremity Palpation - Pulses bilaterally normal.  Neurologic Neurologic evaluation reveals -alert and oriented x 3 with no impairment of recent or remote memory. Mental Status-Normal.  Neuropsychiatric The patient's mood and affect are described as -normal. Judgment and Insight-insight is appropriate concerning matters relevant to self.  Musculoskeletal Normal Exam -  Left-Upper Extremity Strength Normal and Lower Extremity Strength Normal. Normal Exam - Right-Upper Extremity Strength Normal and Lower Extremity Strength Normal.  Lymphatic Head & Neck  General Head & Neck Lymphatics: Bilateral - Description - Normal. Axillary - Did not examine. Femoral & Inguinal - Did not examine.    Assessment & Plan Stephen Hiss M. Biddie Sebek MD; 01/16/2019 9:45 AM) LEFT INGUINAL HERNIA (K40.90) Impression: We discussed the etiology of inguinal hernias. We discussed the signs & symptoms of incarceration & strangulation. We discussed non-operative and operative management. Even tho his CT report showed bilateral fat-containing inguinal hernias with the left side also a portion the sigmoid colon he is only symptomatic on the left side. I cannot detect a inguinal hernia with the right side. He has no symptoms of right side. I do not believe he is a good candidate for laparoscopic approach because of his prior major abdominal surgery and for his prior pulmonary emboli. I think with the laparoscopic approach he would be at higher risk for a large postoperative hematoma therefore have recommended an open approach and only operating on the symptomatic side the left side. It is possible the fat-containing hernia on the right side could just simply be a cord lipoma  The patient has elected to proceed with open repair of left inguinal hernia with mesh   I described the procedure in detail. The patient was given educational material. We discussed the risks and benefits including but not limited to bleeding, infection, chronic inguinal pain, nerve entrapment, hernia recurrence, mesh complications, hematoma formation, urinary retention, injury to the testicles or the ovaries, numbness in the groin, blood clots, injury to the surrounding structures, and anesthesia risk. We also discussed the typical post operative recovery course, including no heavy lifting for 4-6 weeks. I explained that the  likelihood of improvement  of their symptoms is good. We did discuss that he is at increased risk for bleeding and hematoma due to the need for his ongoing blood thinning postoperatively. We also discussed that he is at higher risk for recurrent pulmonary emboli given his history of pulmonary embolism  I have recommended that we wait at least 6-8 weeks since he just finished a course of prednisone. He is also scheduled to get a rituximab infusion in a week or 27 also like to w Current Plans Pt Education - Pamphlet Given - Hernia Surgery: discussed with patient and provided information. You are being scheduled for surgery- Our schedulers will call you.  You should hear from our office's scheduling department within 5 working days about the location, date, and time of surgery. We try to make accommodations for patient's preferences in scheduling surgery, but sometimes the OR schedule or the surgeon's schedule prevents [place] from making those accommodations.  If you have not heard from our office [phone]) in 5 working days, call the office and ask for your surgeon's nurse.  If you have other questions about your diagnosis, plan, or surgery, call the office and ask for your surgeon's nurse.  HISTORY OF PULMONARY EMBOLISM (Z86.711) ANTIPLATELET OR ANTITHROMBOTIC LONG-TERM USE (Z79.02) Impression: We will ask his primary care physician whether or not they feel the patient needs to be bridged on therapeutic Lovenox perioperatively. Initially my plan is to hold the Xarelto 2 days before surgery. We will give him 5000 units of subcutaneous heparin preoperatively. And probably resume his Xarelto on postoperative day 1 or 2 depending on intraoperative findings  [name]. [name], MD, FACS General, Bariatric, & Minimally Invasive Surgery [facility], [place]

## 2019-03-09 ENCOUNTER — Other Ambulatory Visit (HOSPITAL_COMMUNITY): Payer: BLUE CROSS/BLUE SHIELD

## 2019-03-11 DIAGNOSIS — H35371 Puckering of macula, right eye: Secondary | ICD-10-CM | POA: Diagnosis not present

## 2019-03-11 DIAGNOSIS — H2513 Age-related nuclear cataract, bilateral: Secondary | ICD-10-CM | POA: Diagnosis not present

## 2019-03-13 ENCOUNTER — Encounter (HOSPITAL_COMMUNITY): Payer: Self-pay

## 2019-03-13 NOTE — Patient Instructions (Addendum)
DUE TO COVID-19 ONLY ONE VISITOR IS ALLOWED TO COME WITH YOU AND STAY IN THE WAITING ROOM ONLY DURING PRE OP AND PROCEDURE DAY OF SURGERY. THE 1 VISITOR MAY VISIT WITH YOU AFTER SURGERY IN YOUR PRIVATE ROOM DURING VISITING HOURS ONLY!  YOU NEED TO HAVE A COVID 19 TEST ON___12-7-2020____ @_______ , THIS TEST MUST BE DONE BEFORE SURGERY, COME  [place] , [address].  ([place]) ONCE YOUR COVID TEST IS COMPLETED, PLEASE BEGIN THE QUARANTINE INSTRUCTIONS AS OUTLINED IN YOUR HANDOUT.                Stephen Gross   Your procedure is scheduled on: 03-19-2019   Report to [facility] Main  Entrance   Report to admitting at 7:00AM     Call this number if you have problems the morning of surgery [phone]    NO SOLID FOOD AFTER MIDNIGHT THE NIGHT PRIOR TO SURGERY. YOU MAY DRINK CLEAR LIQUIDS.   STOP CLEAR LIQUIDS AT ___6:00am______ AND THEN DRINK THE ENSURE PRE-SURGERY [place]. NOTHING BY MOUTH AFTER THE ENSURE DRINK!    CLEAR LIQUID DIET   Foods Allowed                                                                     Foods Excluded  Coffee and tea, regular and decaf                             liquids that you cannot  Plain Jell-O any favor except red or purple                                           see through such as: Fruit ices (not with fruit pulp)                                     milk, soups, orange juice  Iced Popsicles                                    All solid food Carbonated beverages, regular and diet                                    Cranberry, grape and apple juices Sports drinks like Gatorade Lightly seasoned clear broth or consume(fat free) Sugar, honey syrup  Sample Menu Breakfast                                Lunch                                     Supper Cranberry juice  Beef broth                            Chicken broth Jell-O                                     Grape juice                            Apple juice Coffee or tea                        Jell-O                                      Popsicle                                                Coffee or tea                        Coffee or tea  _____________________________________________________________________   BRUSH YOUR TEETH MORNING OF SURGERY AND RINSE YOUR MOUTH OUT, NO CHEWING GUM CANDY OR MINTS.     Take these medicines the morning of surgery with A SIP OF WATER: tylenol if needed                                 You may not have any metal on your body including hair pins and              piercings  Do not wear jewelry, make-up, lotions, powders or perfumes, deodorant                   Men may shave face and neck.   Do not bring valuables to the hospital. [place].  Contacts, dentures or bridgework may not be worn into surgery.      Patients discharged the day of surgery will not be allowed to drive home. IF YOU ARE HAVING SURGERY AND GOING HOME THE SAME DAY, YOU MUST HAVE AN ADULT TO DRIVE YOU HOME AND BE WITH YOU FOR 24 HOURS. YOU MAY GO HOME BY TAXI OR UBER OR ORTHERWISE, BUT AN ADULT MUST ACCOMPANY YOU HOME AND STAY WITH YOU FOR 24 HOURS.  Name and phone number of your driver:  Special Instructions: N/A              Please read over the following fact sheets you were given: _____________________________________________________________________               [facility] - Preparing for Surgery Before surgery, you can play an important role.  Because skin is not sterile, your skin needs to be as free of germs as possible.  You can reduce the number of germs on your skin by washing with CHG (chlorahexidine gluconate) soap before surgery.  CHG is an antiseptic cleaner which kills germs and bonds with the skin to  continue killing germs even after washing. Please DO NOT use if you have an allergy to CHG or antibacterial soaps.  If your skin becomes  reddened/irritated stop using the CHG and inform your nurse when you arrive at Short Stay. Do not shave (including legs and underarms) for at least 48 hours prior to the first CHG shower.  You may shave your face/neck. Please follow these instructions carefully:  1.  Shower with CHG Soap the night before surgery and the  morning of Surgery.  2.  If you choose to wash your hair, wash your hair first as usual with your  normal  shampoo.  3.  After you shampoo, rinse your hair and body thoroughly to remove the  shampoo.                           4.  Use CHG as you would any other liquid soap.  You can apply chg directly  to the skin and wash                       Gently with a scrungie or clean washcloth.  5.  Apply the CHG Soap to your body ONLY FROM THE NECK DOWN.   Do not use on face/ open                           Wound or open sores. Avoid contact with eyes, ears mouth and genitals (private parts).                       Wash face,  Genitals (private parts) with your normal soap.             6.  Wash thoroughly, paying special attention to the area where your surgery  will be performed.  7.  Thoroughly rinse your body with warm water from the neck down.  8.  DO NOT shower/wash with your normal soap after using and rinsing off  the CHG Soap.                9.  Pat yourself dry with a clean towel.            10.  Wear clean pajamas.            11.  Place clean sheets on your bed the night of your first shower and do not  sleep with pets. Day of Surgery : Do not apply any lotions/deodorants the morning of surgery.  Please wear clean clothes to the hospital/surgery center.  FAILURE TO FOLLOW THESE INSTRUCTIONS MAY RESULT IN THE CANCELLATION OF YOUR SURGERY PATIENT SIGNATURE_________________________________  NURSE SIGNATURE__________________________________  ________________________________________________________________________

## 2019-03-16 ENCOUNTER — Other Ambulatory Visit (HOSPITAL_COMMUNITY)
Admission: RE | Admit: 2019-03-16 | Discharge: 2019-03-16 | Disposition: A | Discharge status: Home / Self Care | Payer: Medicare Other | Source: Ambulatory Visit | Attending: General Surgery | Admitting: General Surgery

## 2019-03-16 ENCOUNTER — Other Ambulatory Visit: Payer: Self-pay

## 2019-03-16 ENCOUNTER — Encounter (HOSPITAL_COMMUNITY)
Admission: RE | Admit: 2019-03-16 | Discharge: 2019-03-16 | Disposition: A | Discharge status: Home / Self Care | Payer: Medicare Other | Source: Ambulatory Visit | Attending: General Surgery | Admitting: General Surgery

## 2019-03-16 ENCOUNTER — Encounter (HOSPITAL_COMMUNITY): Payer: Self-pay

## 2019-03-16 DIAGNOSIS — Z01812 Encounter for preprocedural laboratory examination: Secondary | ICD-10-CM | POA: Insufficient documentation

## 2019-03-16 DIAGNOSIS — K4090 Unilateral inguinal hernia, without obstruction or gangrene, not specified as recurrent: Secondary | ICD-10-CM | POA: Insufficient documentation

## 2019-03-16 DIAGNOSIS — Z20828 Contact with and (suspected) exposure to other viral communicable diseases: Secondary | ICD-10-CM | POA: Insufficient documentation

## 2019-03-16 LAB — COMPREHENSIVE METABOLIC PANEL
ALT: 28 U/L (ref 0–44)
AST: 28 U/L (ref 15–41)
Albumin: 2.1 g/dL — ABNORMAL LOW (ref 3.5–5.0)
Alkaline Phosphatase: 49 U/L (ref 38–126)
Anion gap: 6 (ref 5–15)
BUN: 15 mg/dL (ref 8–23)
CO2: 30 mmol/L (ref 22–32)
Calcium: 8.7 mg/dL — ABNORMAL LOW (ref 8.9–10.3)
Chloride: 105 mmol/L (ref 98–111)
Creatinine, Ser: 0.79 mg/dL (ref 0.61–1.24)
GFR calc Af Amer: >60 mL/min (ref >60)
GFR calc non Af Amer: >60 mL/min (ref >60)
Glucose, Bld: 96 mg/dL (ref 70–99)
Potassium: 4.6 mmol/L (ref 3.5–5.1)
Sodium: 141 mmol/L (ref 135–145)
Total Bilirubin: 0.7 mg/dL (ref 0.3–1.2)
Total Protein: 4.7 g/dL — ABNORMAL LOW (ref 6.5–8.1)

## 2019-03-16 LAB — CBC WITH DIFFERENTIAL/PLATELET
Abs Immature Granulocytes: 0.05 10*3/uL (ref 0.00–0.07)
Basophils Absolute: 0 10*3/uL (ref 0.0–0.1)
Basophils Relative: 1 %
Eosinophils Absolute: 0.1 10*3/uL (ref 0.0–0.5)
Eosinophils Relative: 2 %
HCT: 40.5 % (ref 39.0–52.0)
Hemoglobin: 13.1 g/dL (ref 13.0–17.0)
Immature Granulocytes: 1 %
Lymphocytes Relative: 17 %
Lymphs Abs: 0.9 10*3/uL (ref 0.7–4.0)
MCH: 30.9 pg (ref 26.0–34.0)
MCHC: 32.3 g/dL (ref 30.0–36.0)
MCV: 95.5 fL (ref 80.0–100.0)
Monocytes Absolute: 0.7 10*3/uL (ref 0.1–1.0)
Monocytes Relative: 12 %
Neutro Abs: 3.7 10*3/uL (ref 1.7–7.7)
Neutrophils Relative %: 67 %
Platelets: 300 10*3/uL (ref 150–400)
RBC: 4.24 MIL/uL (ref 4.22–5.81)
RDW: 12.9 % (ref 11.5–15.5)
WBC: 5.5 10*3/uL (ref 4.0–10.5)
nRBC: 0 % (ref 0.0–0.2)

## 2019-03-16 LAB — ABO/RH: ABO/RH(D): O POS

## 2019-03-16 MED ORDER — CHLORHEXIDINE GLUCONATE CLOTH 2 % EX PADS
6.0000 | MEDICATED_PAD | Freq: Once | CUTANEOUS | Status: DC
  Filled 2019-03-16: qty 6

## 2019-03-16 NOTE — Progress Notes (Addendum)
PCP - Dr. Venetia Maxon Cardiologist - n/a  Chest x-ray - n/a EKG - n/a Stress Test -  ECHO -  Cardiac Cath -   Sleep Study - n/a CPAP -   Fasting Blood Sugar -Checks Blood Sugar _____ times a day  Blood Thinner Instructions: Xarelto. Dr. Venetia Maxon prescribed.  Stop 2 days prior to surgery. Resume 1-2 days after surgery pending surgery findings. Aspirin Instructions: Last Dose:  Anesthesia review:  Given to Konrad Felix, PA for review. Hx of PE X 2 last 2019. Will be on Xarelto indefinitely. No cardiac HX.   Patient denies shortness of breath, fever, cough and chest pain at PAT appointment   Patient verbalized understanding of instructions that were given to them at the PAT appointment. Patient was also instructed that they will need to review over the PAT instructions again at home before surgery.

## 2019-03-17 DIAGNOSIS — N022 Recurrent and persistent hematuria with diffuse membranous glomerulonephritis: Secondary | ICD-10-CM | POA: Diagnosis not present

## 2019-03-17 DIAGNOSIS — M25552 Pain in left hip: Secondary | ICD-10-CM | POA: Diagnosis not present

## 2019-03-17 DIAGNOSIS — M25551 Pain in right hip: Secondary | ICD-10-CM | POA: Diagnosis not present

## 2019-03-17 DIAGNOSIS — M8700 Idiopathic aseptic necrosis of unspecified bone: Secondary | ICD-10-CM | POA: Diagnosis not present

## 2019-03-17 DIAGNOSIS — M1612 Unilateral primary osteoarthritis, left hip: Secondary | ICD-10-CM | POA: Diagnosis not present

## 2019-03-17 DIAGNOSIS — Z7952 Long term (current) use of systemic steroids: Secondary | ICD-10-CM | POA: Diagnosis not present

## 2019-03-17 DIAGNOSIS — M879 Osteonecrosis, unspecified: Secondary | ICD-10-CM | POA: Diagnosis not present

## 2019-03-17 LAB — NOVEL CORONAVIRUS, NAA (HOSP ORDER, SEND-OUT TO REF LAB; TAT 18-24 HRS): SARS-CoV-2, NAA: NOT DETECTED

## 2019-03-19 ENCOUNTER — Ambulatory Visit (HOSPITAL_COMMUNITY): Payer: Medicare Other | Admitting: Physician Assistant

## 2019-03-19 ENCOUNTER — Encounter (HOSPITAL_COMMUNITY)
Admission: RE | Disposition: A | Discharge status: Home / Self Care | Payer: Self-pay | Source: Other Acute Inpatient Hospital | Attending: General Surgery

## 2019-03-19 ENCOUNTER — Encounter (HOSPITAL_COMMUNITY): Payer: Self-pay | Admitting: General Surgery

## 2019-03-19 ENCOUNTER — Ambulatory Visit (HOSPITAL_COMMUNITY)
Admission: RE | Admit: 2019-03-19 | Discharge: 2019-03-19 | Disposition: A | Discharge status: Home / Self Care | Payer: Medicare Other | Source: Other Acute Inpatient Hospital | Attending: General Surgery | Admitting: General Surgery

## 2019-03-19 ENCOUNTER — Ambulatory Visit (HOSPITAL_COMMUNITY): Payer: Medicare Other | Admitting: Anesthesiology

## 2019-03-19 DIAGNOSIS — Z7901 Long term (current) use of anticoagulants: Secondary | ICD-10-CM | POA: Insufficient documentation

## 2019-03-19 DIAGNOSIS — Z87891 Personal history of nicotine dependence: Secondary | ICD-10-CM | POA: Insufficient documentation

## 2019-03-19 DIAGNOSIS — K4090 Unilateral inguinal hernia, without obstruction or gangrene, not specified as recurrent: Secondary | ICD-10-CM | POA: Insufficient documentation

## 2019-03-19 DIAGNOSIS — E041 Nontoxic single thyroid nodule: Secondary | ICD-10-CM | POA: Diagnosis not present

## 2019-03-19 DIAGNOSIS — Z86711 Personal history of pulmonary embolism: Secondary | ICD-10-CM | POA: Insufficient documentation

## 2019-03-19 DIAGNOSIS — M1990 Unspecified osteoarthritis, unspecified site: Secondary | ICD-10-CM | POA: Diagnosis not present

## 2019-03-19 HISTORY — PX: INGUINAL HERNIA REPAIR: SHX194

## 2019-03-19 LAB — TYPE AND SCREEN
ABO/RH(D): O POS
Antibody Screen: NEGATIVE

## 2019-03-19 SURGERY — REPAIR, HERNIA, INGUINAL, ADULT
Anesthesia: Regional | Site: Abdomen | Laterality: Left

## 2019-03-19 MED ORDER — ACETAMINOPHEN 500 MG PO TABS
1000.0000 mg | ORAL_TABLET | Freq: Once | ORAL | Status: AC
  Administered 2019-03-19: 1000 mg via ORAL

## 2019-03-19 MED ORDER — SUGAMMADEX SODIUM 200 MG/2ML IV SOLN
INTRAVENOUS | Status: DC | PRN
  Administered 2019-03-19: 190 mg via INTRAVENOUS

## 2019-03-19 MED ORDER — DEXAMETHASONE SODIUM PHOSPHATE 10 MG/ML IJ SOLN
INTRAMUSCULAR | Status: AC
  Filled 2019-03-19: qty 1

## 2019-03-19 MED ORDER — FENTANYL CITRATE (PF) 100 MCG/2ML IJ SOLN
INTRAMUSCULAR | Status: DC | PRN
  Administered 2019-03-19: 50 ug via INTRAVENOUS

## 2019-03-19 MED ORDER — DEXAMETHASONE SODIUM PHOSPHATE 10 MG/ML IJ SOLN
INTRAMUSCULAR | Status: DC | PRN
  Administered 2019-03-19: 5 mg via INTRAVENOUS
  Administered 2019-03-19: 8 mg via INTRAVENOUS

## 2019-03-19 MED ORDER — FENTANYL CITRATE (PF) 100 MCG/2ML IJ SOLN
50.0000 ug | Freq: Once | INTRAMUSCULAR | Status: AC
  Administered 2019-03-19: 100 ug via INTRAVENOUS
  Filled 2019-03-19: qty 2

## 2019-03-19 MED ORDER — SODIUM CHLORIDE 0.9 % IR SOLN
Status: DC | PRN
  Administered 2019-03-19: 1

## 2019-03-19 MED ORDER — HEPARIN SODIUM (PORCINE) 5000 UNIT/ML IJ SOLN
5000.0000 [IU] | Freq: Once | INTRAMUSCULAR | Status: AC
  Administered 2019-03-19: 5000 [IU] via SUBCUTANEOUS
  Filled 2019-03-19: qty 1

## 2019-03-19 MED ORDER — ONDANSETRON HCL 4 MG/2ML IJ SOLN
INTRAMUSCULAR | Status: AC
  Filled 2019-03-19: qty 2

## 2019-03-19 MED ORDER — PROPOFOL 10 MG/ML IV BOLUS
INTRAVENOUS | Status: DC | PRN
  Administered 2019-03-19: 140 mg via INTRAVENOUS

## 2019-03-19 MED ORDER — ACETAMINOPHEN 500 MG PO TABS
1000.0000 mg | ORAL_TABLET | ORAL | Status: DC
  Filled 2019-03-19: qty 2

## 2019-03-19 MED ORDER — MIDAZOLAM HCL 2 MG/2ML IJ SOLN
INTRAMUSCULAR | Status: AC
  Filled 2019-03-19: qty 2

## 2019-03-19 MED ORDER — MIDAZOLAM HCL 2 MG/2ML IJ SOLN: 1.0000 mg | Freq: Once | INTRAMUSCULAR | Status: AC

## 2019-03-19 MED ORDER — SCOPOLAMINE 1 MG/3DAYS TD PT72
1.0000 | MEDICATED_PATCH | TRANSDERMAL | Status: DC
  Administered 2019-03-19: 1.5 mg via TRANSDERMAL
  Filled 2019-03-19: qty 1

## 2019-03-19 MED ORDER — FENTANYL CITRATE (PF) 100 MCG/2ML IJ SOLN: 25.0000 ug | INTRAMUSCULAR | Status: DC | PRN

## 2019-03-19 MED ORDER — CEFAZOLIN SODIUM-DEXTROSE 2-4 GM/100ML-% IV SOLN
2.0000 g | INTRAVENOUS | Status: AC
  Administered 2019-03-19: 09:00:00 2 g via INTRAVENOUS
  Filled 2019-03-19: qty 100

## 2019-03-19 MED ORDER — OXYCODONE HCL 5 MG PO TABS: 5.0000 mg | ORAL_TABLET | Freq: Four times a day (QID) | ORAL | 0 refills | Status: DC | PRN

## 2019-03-19 MED ORDER — BUPIVACAINE HCL (PF) 0.25 % IJ SOLN
INTRAMUSCULAR | Status: DC | PRN
  Administered 2019-03-19: 6 mL

## 2019-03-19 MED ORDER — ROPIVACAINE HCL 5 MG/ML IJ SOLN
INTRAMUSCULAR | Status: DC | PRN
  Administered 2019-03-19: 30 mL via EPIDURAL

## 2019-03-19 MED ORDER — LIDOCAINE 2% (20 MG/ML) 5 ML SYRINGE
INTRAMUSCULAR | Status: DC | PRN
  Administered 2019-03-19: 80 mg via INTRAVENOUS

## 2019-03-19 MED ORDER — PROPOFOL 10 MG/ML IV BOLUS
INTRAVENOUS | Status: AC
  Filled 2019-03-19: qty 20

## 2019-03-19 MED ORDER — EPHEDRINE 5 MG/ML INJ
INTRAVENOUS | Status: AC
  Filled 2019-03-19: qty 10

## 2019-03-19 MED ORDER — MIDAZOLAM HCL 2 MG/2ML IJ SOLN
INTRAMUSCULAR | Status: AC
  Administered 2019-03-19: 2 mg via INTRAVENOUS
  Filled 2019-03-19: qty 2

## 2019-03-19 MED ORDER — EPHEDRINE SULFATE-NACL 50-0.9 MG/10ML-% IV SOSY
PREFILLED_SYRINGE | INTRAVENOUS | Status: DC | PRN
  Administered 2019-03-19 (×3): 10 mg via INTRAVENOUS

## 2019-03-19 MED ORDER — ROCURONIUM BROMIDE 10 MG/ML (PF) SYRINGE
PREFILLED_SYRINGE | INTRAVENOUS | Status: DC | PRN
  Administered 2019-03-19: 50 mg via INTRAVENOUS

## 2019-03-19 MED ORDER — BUPIVACAINE HCL (PF) 0.25 % IJ SOLN
INTRAMUSCULAR | Status: AC
  Filled 2019-03-19: qty 30

## 2019-03-19 MED ORDER — ONDANSETRON HCL 4 MG/2ML IJ SOLN
INTRAMUSCULAR | Status: DC | PRN
  Administered 2019-03-19: 4 mg via INTRAVENOUS

## 2019-03-19 MED ORDER — LIDOCAINE 2% (20 MG/ML) 5 ML SYRINGE
INTRAMUSCULAR | Status: AC
  Filled 2019-03-19: qty 5

## 2019-03-19 MED ORDER — ROCURONIUM BROMIDE 10 MG/ML (PF) SYRINGE
PREFILLED_SYRINGE | INTRAVENOUS | Status: AC
  Filled 2019-03-19: qty 10

## 2019-03-19 MED ORDER — LACTATED RINGERS IV SOLN
INTRAVENOUS | Status: DC
  Administered 2019-03-19: 08:00:00 via INTRAVENOUS

## 2019-03-19 MED ORDER — FENTANYL CITRATE (PF) 100 MCG/2ML IJ SOLN
INTRAMUSCULAR | Status: AC
  Filled 2019-03-19: qty 2

## 2019-03-19 MED ORDER — GABAPENTIN 100 MG PO CAPS
100.0000 mg | ORAL_CAPSULE | ORAL | Status: AC
  Administered 2019-03-19: 100 mg via ORAL
  Filled 2019-03-19: qty 1

## 2019-03-19 SURGICAL SUPPLY — 29 items
BENZOIN TINCTURE PRP APPL 2/3 (GAUZE/BANDAGES/DRESSINGS) ×3 IMPLANT
CLOSURE WOUND 1/2 X4 (GAUZE/BANDAGES/DRESSINGS) ×1
COVER SURGICAL LIGHT HANDLE (MISCELLANEOUS) ×3 IMPLANT
COVER WAND RF STERILE (DRAPES) IMPLANT
DECANTER SPIKE VIAL GLASS SM (MISCELLANEOUS) ×3 IMPLANT
DRAIN PENROSE 18X1/2 LTX STRL (DRAIN) ×3 IMPLANT
DRAPE LAPAROTOMY T 98X78 PEDS (DRAPES) ×3 IMPLANT
DRSG TEGADERM 4X4.75 (GAUZE/BANDAGES/DRESSINGS) ×3 IMPLANT
ELECT REM PT RETURN 15FT ADLT (MISCELLANEOUS) ×3 IMPLANT
GAUZE SPONGE 4X4 12PLY STRL (GAUZE/BANDAGES/DRESSINGS) ×3 IMPLANT
GLOVE BIO SURGEON STRL SZ7.5 (GLOVE) ×3 IMPLANT
GLOVE INDICATOR 8.0 STRL GRN (GLOVE) ×3 IMPLANT
GOWN STRL REUS W/TWL XL LVL3 (GOWN DISPOSABLE) ×6 IMPLANT
KIT BASIN OR (CUSTOM PROCEDURE TRAY) ×3 IMPLANT
KIT TURNOVER KIT A (KITS) ×3 IMPLANT
MARKER SKIN DUAL TIP RULER LAB (MISCELLANEOUS) ×3 IMPLANT
MESH ULTRAPRO 3X6 7.6X15CM (Mesh General) ×3 IMPLANT
NEEDLE HYPO 22GX1.5 SAFETY (NEEDLE) ×3 IMPLANT
PACK GENERAL/GYN (CUSTOM PROCEDURE TRAY) ×3 IMPLANT
PENCIL SMOKE EVACUATOR (MISCELLANEOUS) ×3 IMPLANT
STRIP CLOSURE SKIN 1/2X4 (GAUZE/BANDAGES/DRESSINGS) ×2 IMPLANT
SUT MNCRL AB 4-0 PS2 18 (SUTURE) ×3 IMPLANT
SUT PROLENE 2 0 CT2 30 (SUTURE) ×6 IMPLANT
SUT VIC AB 2-0 SH 27 (SUTURE) ×2
SUT VIC AB 2-0 SH 27X BRD (SUTURE) ×1 IMPLANT
SUT VIC AB 3-0 SH 18 (SUTURE) ×3 IMPLANT
SYR 20ML LL LF (SYRINGE) ×3 IMPLANT
TOWEL OR 17X26 10 PK STRL BLUE (TOWEL DISPOSABLE) ×3 IMPLANT
TOWEL OR NON WOVEN STRL DISP B (DISPOSABLE) ×3 IMPLANT

## 2019-03-19 NOTE — Interval H&P Note (Signed)
History and Physical Interval Note:  03/19/2019 8:36 AM  Verlon Au  has presented today for surgery, with the diagnosis of LEFT INGUINAL HERNIA.  The various methods of treatment have been discussed with the patient and family. After consideration of risks, benefits and other options for treatment, the patient has consented to  Procedure(s): OPEN REPAIR OF LEFT INGUINAL HERNIA WITH MESH (Left) as a surgical intervention.  The patient's history has been reviewed, patient examined, no change in status, stable for surgery.  I have reviewed the patient's chart and labs.  Questions were answered to the patient's satisfaction.    [name]. [name], MD, FACS General, Bariatric, & Minimally Invasive Surgery [facility] [place]  Greer Pickerel

## 2019-03-19 NOTE — Transfer of Care (Signed)
Immediate Anesthesia Transfer of Care Note  Patient: Stephen Gross  Procedure(s) Performed: OPEN REPAIR OF LEFT INGUINAL HERNIA WITH MESH (Left Abdomen)  Patient Location: PACU  Anesthesia Type:General  Level of Consciousness: awake, alert , oriented and patient cooperative  Airway & Oxygen Therapy: Patient Spontanous Breathing and Patient connected to face mask oxygen  Post-op Assessment: Report given to RN, Post -op Vital signs reviewed and stable and Patient moving all extremities  Post vital signs: Reviewed and stable  Last Vitals:  Vitals Value Taken Time  BP 146/98 03/19/19 1037  Temp    Pulse 87 03/19/19 1037  Resp 12 03/19/19 1037  SpO2 99 % 03/19/19 1037  Vitals shown include unvalidated device data.  Last Pain:  Vitals:   03/19/19 0854  TempSrc:   PainSc: 0-No pain         Complications: No apparent anesthesia complications

## 2019-03-19 NOTE — Op Note (Signed)
Stephen Gross [id number] 03/19/2019  07/03/1945   Open Left Indirect Inguinal Hernia Repair with Mesh Procedure Note  Indications: The patient presented with a history of a left, reducible hernia.    Pre-operative Diagnosis: left reducible inguinal hernia  Post-operative Diagnosis: left indirect inguinal hernia  Surgeon: Greer Pickerel MD FACS  Assistants: none  Anesthesia: General endotracheal anesthesia and L TAP block by anesthesia  Procedure Details  The patient was seen again in the Holding Room. The risks, benefits, complications, treatment options, and expected outcomes were discussed with the patient. The possibilities of reaction to medication, pulmonary aspiration, perforation of viscus, bleeding, recurrent infection, the need for additional procedures, and development of a complication requiring transfusion or further operation were discussed with the patient and/or family. The likelihood of success in repairing the hernia and returning the patient to their previous functional status is good.  There was concurrence with the proposed plan, and informed consent was obtained. The site of surgery was properly noted/marked. The patient was taken to the Operating Room, identified as Stephen Gross, and the procedure verified as left inguinal hernia repair. A Time Out was held and the above information confirmed. The patient had a history dvt and is lifelong xarelto. We stopped it 2 days before surgery. He was given 5000 units of subcu heparin preoperatively. ERAS protocol used, pt given oral tylenol and gabapentin and received a TAP block in holding by anesthesia.   The patient was placed in the supine position and underwent induction of anesthesia. The lower abdomen and groin was prepped with Chloraprep and draped in the standard fashion, and 0.25% Marcaine with epinephrine was used to anesthetize the skin over the mid-portion of the inguinal canal. An oblique incision was made. Dissection was  carried down through the subcutaneous tissue with cautery to the external oblique fascia.  We opened the external oblique fascia along the direction of its fibers to the external ring.  The spermatic cord was circumferentially dissected bluntly and retracted with a Penrose drain.  The floor of the inguinal canal was inspected & there was no direct defect.  Ilioinguinal nerve identified and preserved. We skeletonized the spermatic cord and isolated a indirect hernia sac which was reduced. A cord lipoma was also isolated and ligated with a 3-0 vicryl and discarded.  We used a 3 x 6 inch piece of Ultrapro mesh, which was cut into a keyhole shape.  This was secured with 2-0 Prolene, beginning at the pubic tubercle, running this along the shelving edge inferiorly. Superiorly, the mesh was secured to the internal oblique fascia with interrupted 2-0 Prolene sutures.  The tails of the mesh were sutured together behind the spermatic cord.  The mesh was tucked underneath the external oblique fascia laterally.  The external oblique fascia was reapproximated with 2-0 Vicryl.  3-0 Vicryl was used to close the subcutaneous tissues and 4-0 Monocryl was used to close the skin in subcuticular fashion.  Benzoin and steri-strips were used to seal the incision.  A clean dressing was applied.  The patient was then extubated and brought to the recovery room in stable condition.  All sponge, instrument, and needle counts were correct prior to closure and at the conclusion of the case.  Specimen: cord lipoma - discarded  Estimated Blood Loss: Minimal                 Complications: None; patient tolerated the procedure well.         Disposition: PACU - hemodynamically stable.  Condition: stable  [name]. [name], MD, FACS General, Bariatric, & Minimally Invasive Surgery [facility], [place]

## 2019-03-19 NOTE — Anesthesia Postprocedure Evaluation (Signed)
Anesthesia Post Note  Patient: Stephen Gross  Procedure(s) Performed: OPEN REPAIR OF LEFT INGUINAL HERNIA WITH MESH (Left Abdomen)     Patient location during evaluation: PACU Anesthesia Type: General and Regional Level of consciousness: awake and alert Pain management: pain level controlled Vital Signs Assessment: post-procedure vital signs reviewed and stable Respiratory status: spontaneous breathing, nonlabored ventilation, respiratory function stable and patient connected to nasal cannula oxygen Cardiovascular status: blood pressure returned to baseline and stable Postop Assessment: no apparent nausea or vomiting Anesthetic complications: no    Last Vitals:  Vitals:   03/19/19 1126 03/19/19 1149  BP: (!) 138/91 132/69  Pulse: 74 70  Resp: 18 16  Temp: 36.6 C   SpO2: 97% 97%    Last Pain:  Vitals:   03/19/19 1149  TempSrc:   PainSc: 0-No pain                 Symon Norwood L Aletha Allebach

## 2019-03-19 NOTE — Anesthesia Procedure Notes (Addendum)
Anesthesia Regional Block: Quadratus lumborum   Pre-Anesthetic Checklist: ,, timeout performed, Correct Patient, Correct Site, Correct Laterality, Correct Procedure, Correct Position, site marked, Risks and benefits discussed,  Surgical consent,  Pre-op evaluation,  At surgeon's request and post-op pain management  Laterality: Left  Prep: Maximum Sterile Barrier Precautions used, chloraprep       Needles:  Injection technique: Single-shot  Needle Type: Echogenic Stimulator Needle     Needle Length: 9cm  Needle Gauge: 22     Additional Needles:   Procedures:,,,, ultrasound used (permanent image in chart),,,,  Narrative:  Start time: 03/19/2019 8:45 AM End time: 03/19/2019 8:55 AM Injection made incrementally with aspirations every 5 mL.  Performed by: Personally  Anesthesiologist: Freddrick March, MD  Additional Notes: Monitors applied. No increased pain on injection. No increased resistance to injection. Injection made in 5cc increments. Good needle visualization. Patient tolerated procedure well.

## 2019-03-19 NOTE — Discharge Instructions (Signed)
RESUME ELIQUIS ON Saturday, December 12   [facility], [place]  UMBILICAL OR INGUINAL HERNIA REPAIR: POST OP INSTRUCTIONS  Always review your discharge instruction sheet given to you by the facility where your surgery was performed. IF YOU HAVE DISABILITY OR FAMILY LEAVE FORMS, YOU MUST BRING THEM TO THE OFFICE FOR PROCESSING.   DO NOT GIVE THEM TO YOUR DOCTOR.  1. A  prescription for pain medication may be given to you upon discharge.  Take your pain medication as prescribed, if needed.  If narcotic pain medicine is not needed, then you may take acetaminophen (Tylenol) or ibuprofen (Advil) as needed. SEE PAIN CONTROL HANDOUT! 2. Take your usually prescribed medications unless otherwise directed. 3. If you need a refill on your pain medication, please contact your pharmacy.  They will contact our office to request authorization. Prescriptions will not be filled after 5 pm or on week-ends. 4. You should follow a light diet the first 24 hours after arrival home, such as soup and crackers, etc.  Be sure to include lots of fluids daily.  Resume your normal diet the day after surgery. 5. Most patients will experience some swelling and bruising around the umbilicus or in the groin and scrotum.  Ice packs and reclining will help.  Swelling and bruising can take several days to resolve.  6. It is common to experience some constipation if taking pain medication after surgery.  Increasing fluid intake and taking a stool softener (such as Colace) will usually help or prevent this problem from occurring.  A mild laxative (Milk of Magnesia or Miralax) should be taken according to package directions if there are no bowel movements after 48 hours. 7. Unless discharge instructions indicate otherwise, you may remove your bandages 24-48 hours after surgery, and you may shower at that time.  You may have steri-strips (small skin tapes) in place directly over the incision.  These strips should be left on  the skin for 7-10 days.  8. ACTIVITIES:  You may resume regular (light) daily activities beginning the next day--such as daily self-care, walking, climbing stairs--gradually increasing activities as tolerated.  You may have sexual intercourse when it is comfortable.  Refrain from any heavy lifting or straining until approved by your doctor. a. You may drive when you are no longer taking prescription pain medication, you can comfortably wear a seatbelt, and you can safely maneuver your car and apply brakes. b. RETURN TO WORK:  9. You should see your doctor in the office for a follow-up appointment approximately 2-3 weeks after your surgery.  Make sure that you call for this appointment within a day or two after you arrive home to insure a convenient appointment time. 10. OTHER INSTRUCTIONS: DO NOT LIFT, PUSH/PULL ANYTHING GREATER THAN 10 LBS FOR 4 WEEKS   WHEN TO CALL YOUR DOCTOR: 1. Fever over 101.0 2. Inability to urinate 3. Nausea and/or vomiting 4. Extreme swelling or bruising 5. Continued bleeding from incision. 6. Increased pain, redness, or drainage from the incision  The clinic staff is available to answer your questions during regular business hours.  Please dont hesitate to call and ask to speak to one of the nurses for clinical concerns.  If you have a medical emergency, go to the nearest emergency room or call 911.  A surgeon from [facility] is always on call at the hospital   [address], [place], [place], [place]  [address] ?  [address] [address], [place], [place]  [id number] [phone] ? [phone] ? FAX [phone] Web site: [email or web address]     Managing Your Pain After Surgery Without Opioids    Thank you for participating in our program to help patients manage their pain after surgery without opioids. This is part of our effort to provide you with the best care possible, without exposing you or your family to the risk that  opioids pose.  What pain can I expect after surgery? You can expect to have some pain after surgery. This is normal. The pain is typically worse the day after surgery, and quickly begins to get better. Many studies have found that many patients are able to manage their pain after surgery with Over-the-Counter (OTC) medications such as Tylenol and Motrin. If you have a condition that does not allow you to take Tylenol or Motrin, notify your surgical team.  How will I manage my pain? The best strategy for controlling your pain after surgery is around the clock pain control with Tylenol (acetaminophen) and Motrin (ibuprofen or Advil). Alternating these medications with each other allows you to maximize your pain control. In addition to Tylenol and Motrin, you can use heating pads or ice packs on your incisions to help reduce your pain.  How will I alternate your regular strength over-the-counter pain medication? You will take a dose of pain medication every three hours. ; Start by taking 650 mg of Tylenol (2 pills of 325 mg) ; 3 hours later take 600 mg of Motrin (3 pills of 200 mg) ; 3 hours after taking the Motrin take 650 mg of Tylenol ; 3 hours after that take 600 mg of Motrin.   - 1 -  See example - if your first dose of Tylenol is at 12:00 PM   12:00 PM Tylenol 650 mg (2 pills of 325 mg)  3:00 PM Motrin 600 mg (3 pills of 200 mg)  6:00 PM Tylenol 650 mg (2 pills of 325 mg)  9:00 PM Motrin 600 mg (3 pills of 200 mg)  Continue alternating every 3 hours   We recommend that you follow this schedule around-the-clock for at least 3 days after surgery, or until you feel that it is no longer needed. Use the table on the last page of this handout to keep track of the medications you are taking. Important: Do not take more than 3000mg  of Tylenol or 1800mg  of Motrin in a 24-hour period. Do not take ibuprofen/Motrin if you have a history of bleeding stomach ulcers, severe kidney disease, &/or  actively taking a blood thinner  What if I still have pain? If you have pain that is not controlled with the over-the-counter pain medications (Tylenol and Motrin or Advil) you might have what we call breakthrough pain. You will receive a prescription for a small amount of an opioid pain medication such as Oxycodone, Tramadol, or Tylenol with Codeine. Use these opioid pills in the first 24 hours after surgery if you have breakthrough pain. Do not take more than 1 pill every 4-6 hours.  If you still have uncontrolled pain after using all opioid pills, don't hesitate to call our staff using the number provided. We will help make sure you are managing your pain in the best way possible, and if necessary, we can provide a prescription for additional pain medication.   Day 1    Time  Name of Medication Number of pills taken  Amount of Acetaminophen  Pain Level   Comments  AM PM  AM PM       AM PM       AM PM       AM PM       AM PM       AM PM       AM PM       Total Daily amount of Acetaminophen Do not take more than  3,000 mg per day      Day 2    Time  Name of Medication Number of pills taken  Amount of Acetaminophen  Pain Level   Comments  AM PM       AM PM       AM PM       AM PM       AM PM       AM PM       AM PM       AM PM       Total Daily amount of Acetaminophen Do not take more than  3,000 mg per day      Day 3    Time  Name of Medication Number of pills taken  Amount of Acetaminophen  Pain Level   Comments  AM PM       AM PM       AM PM       AM PM          AM PM       AM PM       AM PM       AM PM       Total Daily amount of Acetaminophen Do not take more than  3,000 mg per day      Day 4    Time  Name of Medication Number of pills taken  Amount of Acetaminophen  Pain Level   Comments  AM PM       AM PM       AM PM       AM PM       AM PM       AM PM       AM PM       AM PM       Total Daily amount of  Acetaminophen Do not take more than  3,000 mg per day      Day 5    Time  Name of Medication Number of pills taken  Amount of Acetaminophen  Pain Level   Comments  AM PM       AM PM       AM PM       AM PM       AM PM       AM PM       AM PM       AM PM       Total Daily amount of Acetaminophen Do not take more than  3,000 mg per day       Day 6    Time  Name of Medication Number of pills taken  Amount of Acetaminophen  Pain Level  Comments  AM PM       AM PM       AM PM       AM PM       AM PM       AM PM       AM PM       AM PM  Total Daily amount of Acetaminophen Do not take more than  3,000 mg per day      Day 7    Time  Name of Medication Number of pills taken  Amount of Acetaminophen  Pain Level   Comments  AM PM       AM PM       AM PM       AM PM       AM PM       AM PM       AM PM       AM PM       Total Daily amount of Acetaminophen Do not take more than  3,000 mg per day        For additional information about how and where to safely dispose of unused opioid medications - [employer]  Disclaimer: This document contains information and/or instructional materials adapted from [employer] [place] for the typical patient with your condition. It does not replace medical advice from your health care provider because your experience may differ from that of the typical patient. Talk to your health care provider if you have any questions about this document, your condition or your treatment plan. Adapted from [place]

## 2019-03-19 NOTE — Anesthesia Preprocedure Evaluation (Addendum)
Anesthesia Evaluation  Patient identified by MRN, date of birth, ID band Patient awake    Reviewed: Allergy & Precautions, NPO status , Patient's Chart, lab work & pertinent test results  Airway Mallampati: II  TM Distance: >3 FB Neck ROM: Full    Dental no notable dental hx. (+) Teeth Intact, Dental Advisory Given   Pulmonary former smoker, PE   Pulmonary exam normal breath sounds clear to auscultation       Cardiovascular + DVT  Normal cardiovascular exam Rhythm:Regular Rate:Normal     Neuro/Psych negative neurological ROS  negative psych ROS   GI/Hepatic negative GI ROS, Neg liver ROS,   Endo/Other  negative endocrine ROS  Renal/GU negative Renal ROS  negative genitourinary   Musculoskeletal  (+) Arthritis ,   Abdominal   Peds  Hematology  (+) Blood dyscrasia (on xarelto), ,   Anesthesia Other Findings   Reproductive/Obstetrics                            Anesthesia Physical Anesthesia Plan  ASA: III  Anesthesia Plan: General and Regional   Post-op Pain Management:  Regional for Post-op pain   Induction: Intravenous  PONV Risk Score and Plan: 2 and Dexamethasone, Ondansetron and Midazolam  Airway Management Planned: Oral ETT  Additional Equipment:   Intra-op Plan:   Post-operative Plan: Extubation in OR  Informed Consent: I have reviewed the patients History and Physical, chart, labs and discussed the procedure including the risks, benefits and alternatives for the proposed anesthesia with the patient or authorized representative who has indicated his/her understanding and acceptance.     Dental advisory given  Plan Discussed with: CRNA  Anesthesia Plan Comments:       Anesthesia Quick Evaluation

## 2019-03-19 NOTE — Progress Notes (Signed)
Ready for DC to home, tolerates PO fluids/ solids well, very oriented x 4, ambulated to restroom w/o assistance, gait very steady, vss, voices no complaints. AVS reviewed with pt, opportunity for questions provided. Copy of instructions provided. Discussed safety while taking PO pain med, instructions on showering and post op wound care also provided. Pt able to dress himeself w/o assitance. Pt escorted to exit vai wc.

## 2019-03-19 NOTE — Anesthesia Procedure Notes (Signed)

## 2019-03-19 NOTE — Progress Notes (Signed)
AssistedDr. Hulan Fray with left, ultrasound guided, transabdominal plane block. Side rails up, monitors on throughout procedure. See vital signs in flow sheet. Tolerated Procedure well.

## 2019-03-20 ENCOUNTER — Encounter: Payer: Self-pay | Admitting: *Deleted

## 2019-03-26 IMAGING — US US THYROID BIOPSY
1 series · 13 of 17 positions shown · non-contrast
Comparison: US Thyroid 11/27/16

INDICATION: Indeterminate thyroid nodule

Left superior thyroid nodule
1.5 cm
EXAM:
ULTRASOUND GUIDED FINE NEEDLE ASPIRATION OF INDETERMINATE THYROID
NODULE
TECHNIQUE: Informed written consent was obtained from the patient after a
discussion of the risks, benefits and alternatives to treatment.
Questions regarding the procedure were encouraged and answered. A
timeout was performed prior to the initiation of the procedure.

[Series 1: us thyroid biopsy · 0.04mm/px · 17 acquisitions, 13 frames shown]
[im 1/17]
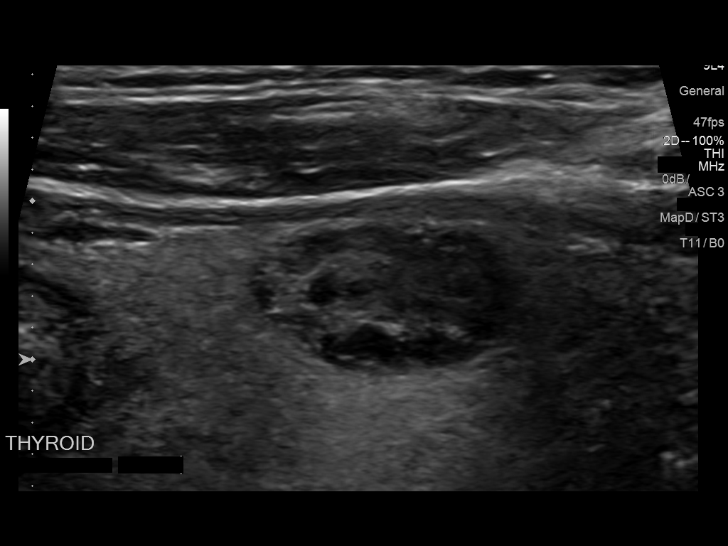
[im 2/17]
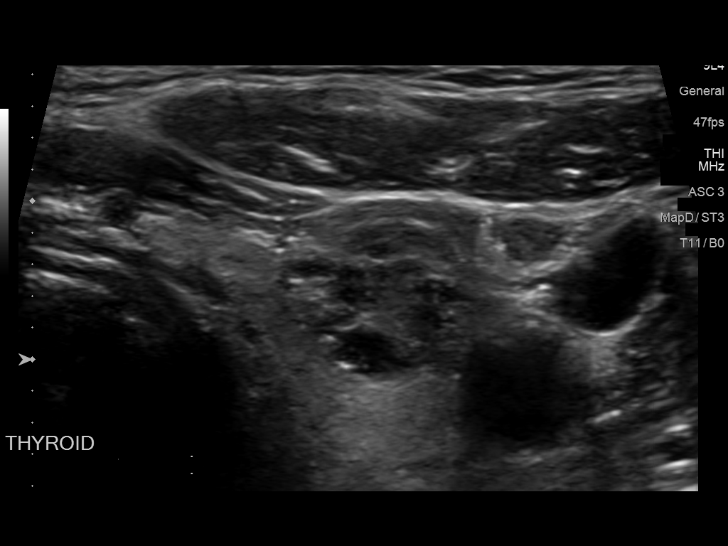
[im 4/17]
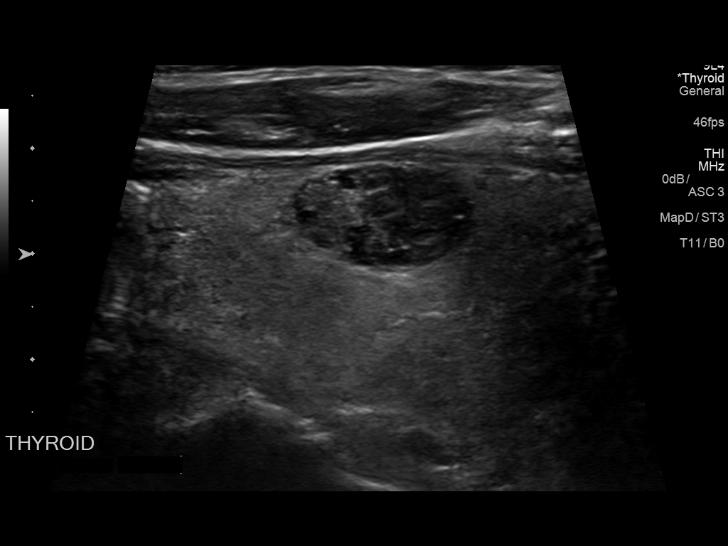
[im 5/17]
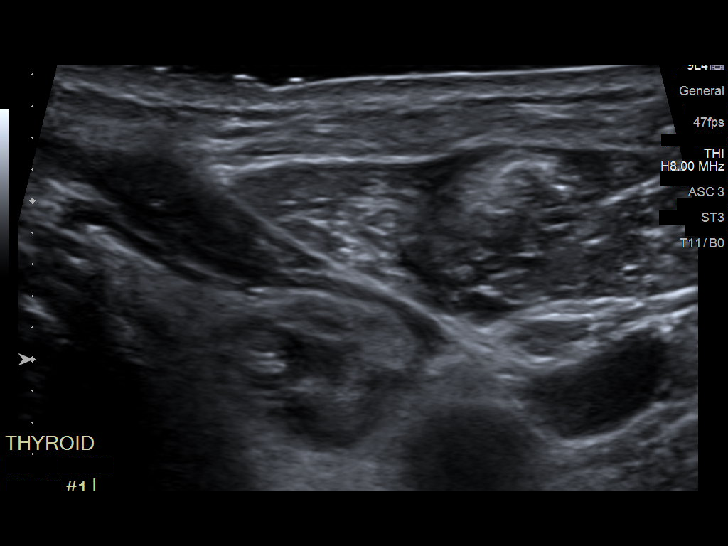
[im 6/17]
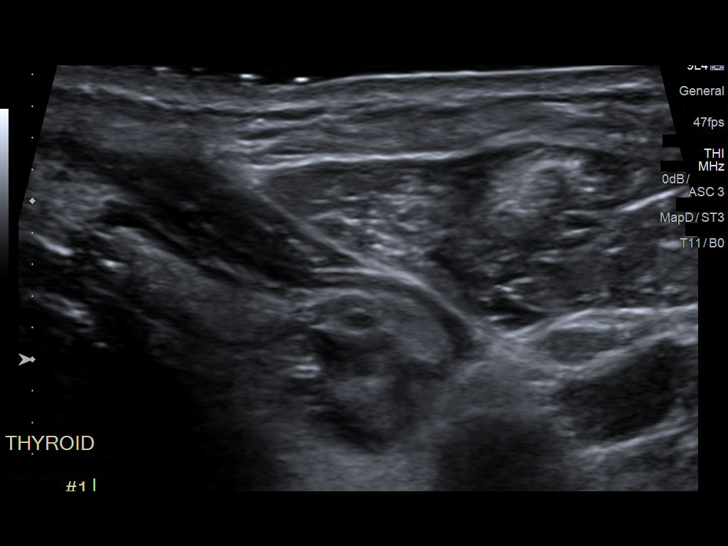
[im 8/17]
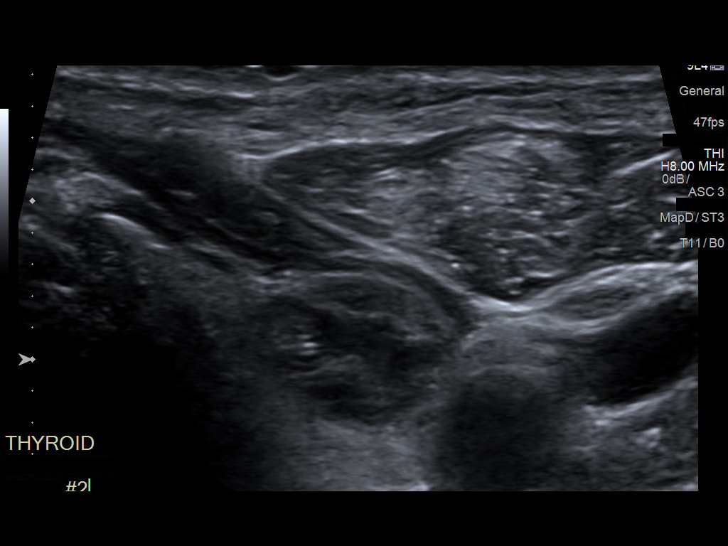
[im 9/17]
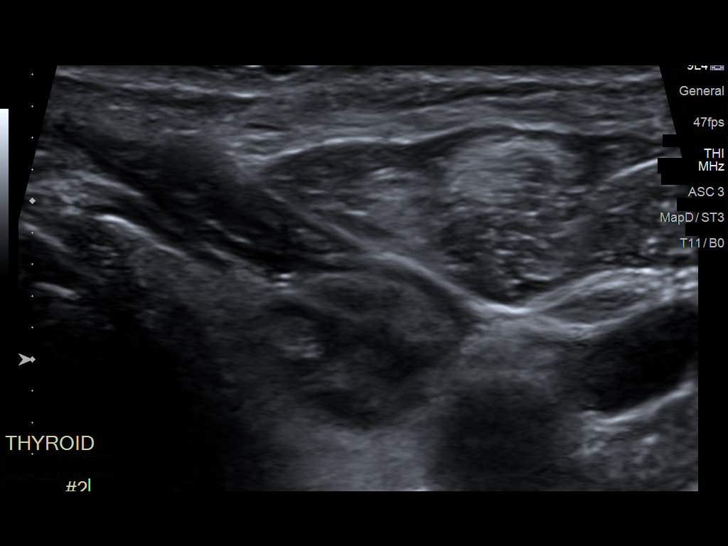
[im 10/17]
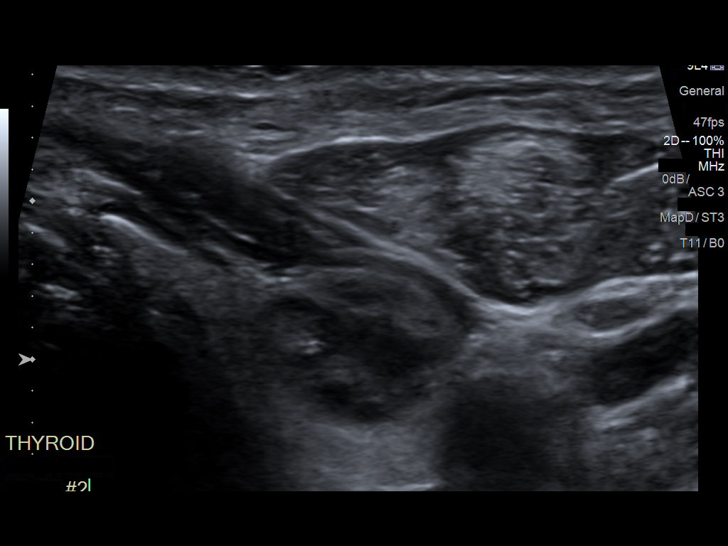
[im 12/17]
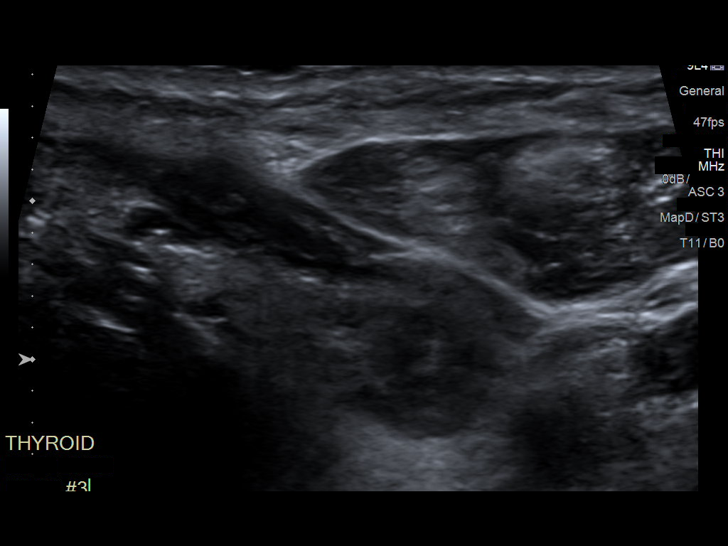
[im 13/17]
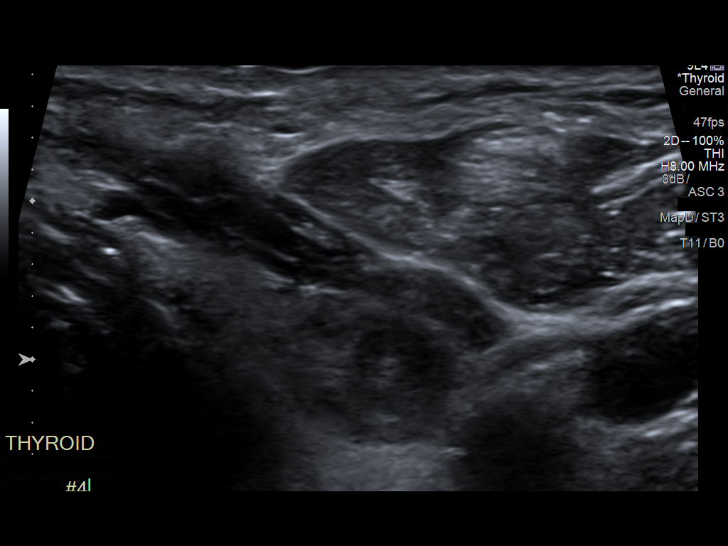
[im 14/17]
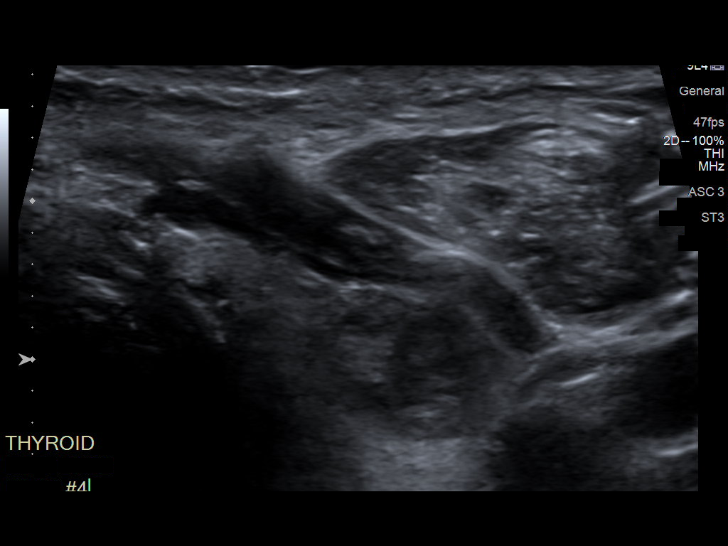
[im 16/17]
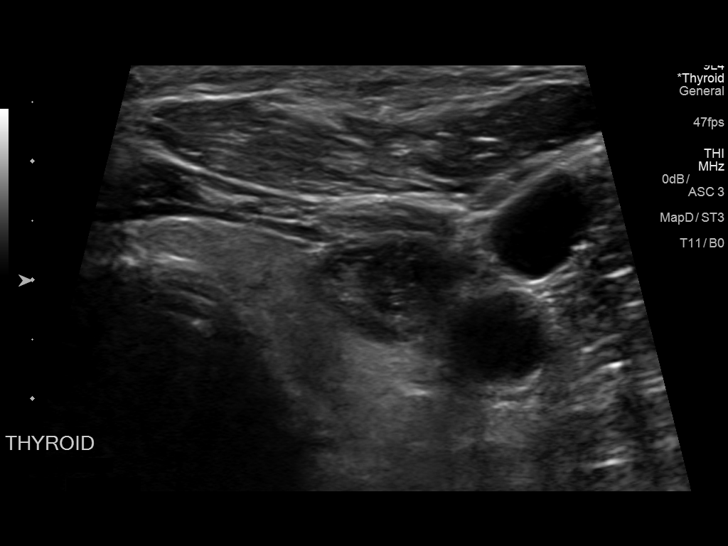
[im 17/17]
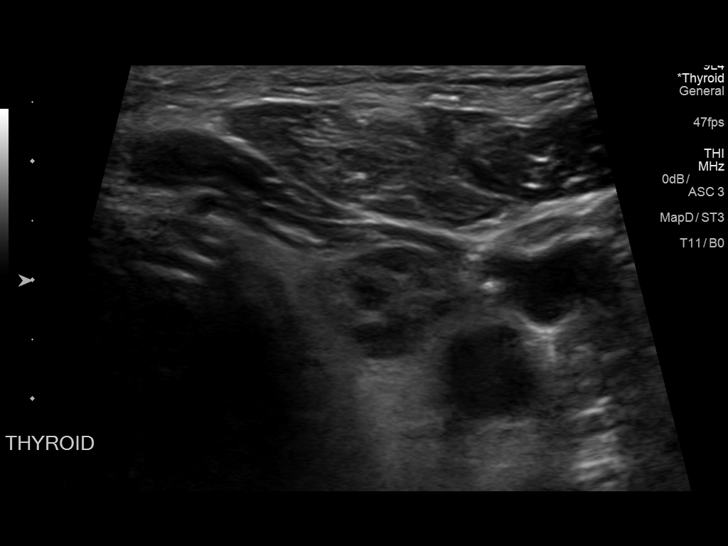

[13 of 17 positions shown; findings below may reference images not displayed]

Left superior thyroid biopsy 12/04/2016

MEDICATIONS:
5 cc 1% lidocaine

COMPLICATIONS:
None immediate.
Pre-procedural ultrasound scanning demonstrated unchanged size and
appearance of the indeterminate nodule within the left superior
thyroid

The procedure was planned. The neck was prepped in the usual sterile
fashion, and a sterile drape was applied covering the operative
field. A timeout was performed prior to the initiation of the
procedure. Local anesthesia was provided with 1% lidocaine.

Under direct ultrasound guidance, 4 FNA biopsies were performed of
the left superior thyroid nodule with a 25 gauge needle. Multiple
ultrasound images were saved for procedural documentation purposes.
The samples were prepared and submitted to pathology.

Limited post procedural scanning was negative for hematoma or
additional complication. Dressings were placed. The patient
tolerated the above procedures procedure well without immediate
postprocedural complication.
FINDINGS: Nodule reference number based on prior diagnostic ultrasound: 1

Maximum size:  1.5 cm

Location: Left; Superior

ACR TI-RADS risk category: TR4 (4-6 points)

Reason for biopsy: nondiagnostic on prior biopsy performed at
[facility] per Dr Lateef

Ultrasound imaging confirms appropriate placement of the needles
within the thyroid nodule.
IMPRESSION: Technically successful ultrasound guided fine needle aspiration of
left superior thyroid nodule

Read by

Klever Jumper

## 2019-04-14 DIAGNOSIS — Z86711 Personal history of pulmonary embolism: Secondary | ICD-10-CM | POA: Diagnosis not present

## 2019-04-14 DIAGNOSIS — Z7901 Long term (current) use of anticoagulants: Secondary | ICD-10-CM | POA: Diagnosis not present

## 2019-04-14 DIAGNOSIS — Z7952 Long term (current) use of systemic steroids: Secondary | ICD-10-CM | POA: Diagnosis not present

## 2019-04-14 DIAGNOSIS — D137 Benign neoplasm of endocrine pancreas: Secondary | ICD-10-CM | POA: Diagnosis not present

## 2019-04-14 DIAGNOSIS — N182 Chronic kidney disease, stage 2 (mild): Secondary | ICD-10-CM | POA: Diagnosis not present

## 2019-04-14 DIAGNOSIS — Z87891 Personal history of nicotine dependence: Secondary | ICD-10-CM | POA: Diagnosis not present

## 2019-04-14 DIAGNOSIS — Z79899 Other long term (current) drug therapy: Secondary | ICD-10-CM | POA: Diagnosis not present

## 2019-04-14 DIAGNOSIS — I129 Hypertensive chronic kidney disease with stage 1 through stage 4 chronic kidney disease, or unspecified chronic kidney disease: Secondary | ICD-10-CM | POA: Diagnosis not present

## 2019-05-07 DIAGNOSIS — M7061 Trochanteric bursitis, right hip: Secondary | ICD-10-CM | POA: Diagnosis not present

## 2019-05-07 DIAGNOSIS — M7062 Trochanteric bursitis, left hip: Secondary | ICD-10-CM | POA: Diagnosis not present

## 2019-05-08 DIAGNOSIS — R918 Other nonspecific abnormal finding of lung field: Secondary | ICD-10-CM | POA: Diagnosis not present

## 2019-05-08 DIAGNOSIS — C787 Secondary malignant neoplasm of liver and intrahepatic bile duct: Secondary | ICD-10-CM | POA: Diagnosis not present

## 2019-05-08 DIAGNOSIS — C7B8 Other secondary neuroendocrine tumors: Secondary | ICD-10-CM | POA: Diagnosis not present

## 2019-05-08 DIAGNOSIS — C7A8 Other malignant neuroendocrine tumors: Secondary | ICD-10-CM | POA: Diagnosis not present

## 2019-05-18 DIAGNOSIS — D3A8 Other benign neuroendocrine tumors: Secondary | ICD-10-CM | POA: Diagnosis not present

## 2019-05-20 DIAGNOSIS — M7712 Lateral epicondylitis, left elbow: Secondary | ICD-10-CM | POA: Diagnosis not present

## 2019-06-17 DIAGNOSIS — M25522 Pain in left elbow: Secondary | ICD-10-CM | POA: Diagnosis not present

## 2019-06-24 DIAGNOSIS — M7712 Lateral epicondylitis, left elbow: Secondary | ICD-10-CM | POA: Diagnosis not present

## 2019-07-01 DIAGNOSIS — M25622 Stiffness of left elbow, not elsewhere classified: Secondary | ICD-10-CM | POA: Diagnosis not present

## 2019-07-01 DIAGNOSIS — M6281 Muscle weakness (generalized): Secondary | ICD-10-CM | POA: Diagnosis not present

## 2019-07-01 DIAGNOSIS — M25522 Pain in left elbow: Secondary | ICD-10-CM | POA: Diagnosis not present

## 2019-07-08 DIAGNOSIS — M25622 Stiffness of left elbow, not elsewhere classified: Secondary | ICD-10-CM | POA: Diagnosis not present

## 2019-07-08 DIAGNOSIS — M6281 Muscle weakness (generalized): Secondary | ICD-10-CM | POA: Diagnosis not present

## 2019-07-08 DIAGNOSIS — M25522 Pain in left elbow: Secondary | ICD-10-CM | POA: Diagnosis not present

## 2019-07-16 DIAGNOSIS — M25622 Stiffness of left elbow, not elsewhere classified: Secondary | ICD-10-CM | POA: Diagnosis not present

## 2019-07-16 DIAGNOSIS — M6281 Muscle weakness (generalized): Secondary | ICD-10-CM | POA: Diagnosis not present

## 2019-07-16 DIAGNOSIS — M25522 Pain in left elbow: Secondary | ICD-10-CM | POA: Diagnosis not present

## 2019-07-27 DIAGNOSIS — M25522 Pain in left elbow: Secondary | ICD-10-CM | POA: Diagnosis not present

## 2019-07-27 DIAGNOSIS — M6281 Muscle weakness (generalized): Secondary | ICD-10-CM | POA: Diagnosis not present

## 2019-07-27 DIAGNOSIS — M25622 Stiffness of left elbow, not elsewhere classified: Secondary | ICD-10-CM | POA: Diagnosis not present

## 2019-08-04 DIAGNOSIS — R801 Persistent proteinuria, unspecified: Secondary | ICD-10-CM | POA: Diagnosis not present

## 2019-08-04 DIAGNOSIS — R768 Other specified abnormal immunological findings in serum: Secondary | ICD-10-CM | POA: Diagnosis not present

## 2019-08-04 DIAGNOSIS — N022 Recurrent and persistent hematuria with diffuse membranous glomerulonephritis: Secondary | ICD-10-CM | POA: Diagnosis not present

## 2019-08-04 DIAGNOSIS — D3A8 Other benign neuroendocrine tumors: Secondary | ICD-10-CM | POA: Diagnosis not present

## 2019-08-04 DIAGNOSIS — N049 Nephrotic syndrome with unspecified morphologic changes: Secondary | ICD-10-CM | POA: Diagnosis not present

## 2019-08-04 DIAGNOSIS — C7A8 Other malignant neuroendocrine tumors: Secondary | ICD-10-CM | POA: Diagnosis not present

## 2019-08-04 DIAGNOSIS — C787 Secondary malignant neoplasm of liver and intrahepatic bile duct: Secondary | ICD-10-CM | POA: Diagnosis not present

## 2019-08-05 DIAGNOSIS — M7712 Lateral epicondylitis, left elbow: Secondary | ICD-10-CM | POA: Diagnosis not present

## 2019-08-24 DIAGNOSIS — N022 Recurrent and persistent hematuria with diffuse membranous glomerulonephritis: Secondary | ICD-10-CM | POA: Diagnosis not present

## 2019-11-19 DIAGNOSIS — R911 Solitary pulmonary nodule: Secondary | ICD-10-CM | POA: Diagnosis not present

## 2019-11-19 DIAGNOSIS — K8681 Exocrine pancreatic insufficiency: Secondary | ICD-10-CM | POA: Diagnosis not present

## 2019-11-19 DIAGNOSIS — D137 Benign neoplasm of endocrine pancreas: Secondary | ICD-10-CM | POA: Diagnosis not present

## 2019-11-19 DIAGNOSIS — Z9049 Acquired absence of other specified parts of digestive tract: Secondary | ICD-10-CM | POA: Diagnosis not present

## 2019-11-19 DIAGNOSIS — K838 Other specified diseases of biliary tract: Secondary | ICD-10-CM | POA: Diagnosis not present

## 2019-11-19 DIAGNOSIS — Z90411 Acquired partial absence of pancreas: Secondary | ICD-10-CM | POA: Diagnosis not present

## 2019-11-23 DIAGNOSIS — D137 Benign neoplasm of endocrine pancreas: Secondary | ICD-10-CM | POA: Insufficient documentation

## 2019-11-24 DIAGNOSIS — C7A8 Other malignant neuroendocrine tumors: Secondary | ICD-10-CM | POA: Diagnosis not present

## 2019-11-24 DIAGNOSIS — D3A8 Other benign neuroendocrine tumors: Secondary | ICD-10-CM | POA: Diagnosis not present

## 2019-11-24 DIAGNOSIS — D137 Benign neoplasm of endocrine pancreas: Secondary | ICD-10-CM | POA: Diagnosis not present

## 2019-12-16 DIAGNOSIS — R801 Persistent proteinuria, unspecified: Secondary | ICD-10-CM | POA: Diagnosis not present

## 2019-12-16 DIAGNOSIS — I129 Hypertensive chronic kidney disease with stage 1 through stage 4 chronic kidney disease, or unspecified chronic kidney disease: Secondary | ICD-10-CM | POA: Diagnosis not present

## 2019-12-16 DIAGNOSIS — Z79899 Other long term (current) drug therapy: Secondary | ICD-10-CM | POA: Diagnosis not present

## 2019-12-16 DIAGNOSIS — R809 Proteinuria, unspecified: Secondary | ICD-10-CM | POA: Diagnosis not present

## 2019-12-16 DIAGNOSIS — N049 Nephrotic syndrome with unspecified morphologic changes: Secondary | ICD-10-CM | POA: Diagnosis not present

## 2019-12-16 DIAGNOSIS — N182 Chronic kidney disease, stage 2 (mild): Secondary | ICD-10-CM | POA: Diagnosis not present

## 2019-12-16 DIAGNOSIS — Z87891 Personal history of nicotine dependence: Secondary | ICD-10-CM | POA: Diagnosis not present

## 2019-12-16 DIAGNOSIS — N022 Recurrent and persistent hematuria with diffuse membranous glomerulonephritis: Secondary | ICD-10-CM | POA: Diagnosis not present

## 2020-02-09 DIAGNOSIS — Z23 Encounter for immunization: Secondary | ICD-10-CM | POA: Diagnosis not present

## 2020-03-08 DIAGNOSIS — K5730 Diverticulosis of large intestine without perforation or abscess without bleeding: Secondary | ICD-10-CM | POA: Diagnosis not present

## 2020-03-08 DIAGNOSIS — N401 Enlarged prostate with lower urinary tract symptoms: Secondary | ICD-10-CM | POA: Diagnosis not present

## 2020-03-11 DIAGNOSIS — H35371 Puckering of macula, right eye: Secondary | ICD-10-CM | POA: Diagnosis not present

## 2020-03-11 DIAGNOSIS — H35372 Puckering of macula, left eye: Secondary | ICD-10-CM | POA: Diagnosis not present

## 2020-03-15 DIAGNOSIS — N10 Acute tubulo-interstitial nephritis: Secondary | ICD-10-CM | POA: Diagnosis not present

## 2020-03-15 DIAGNOSIS — N052 Unspecified nephritic syndrome with diffuse membranous glomerulonephritis: Secondary | ICD-10-CM | POA: Diagnosis not present

## 2020-05-02 DIAGNOSIS — N052 Unspecified nephritic syndrome with diffuse membranous glomerulonephritis: Secondary | ICD-10-CM | POA: Diagnosis not present

## 2020-05-03 DIAGNOSIS — N182 Chronic kidney disease, stage 2 (mild): Secondary | ICD-10-CM | POA: Diagnosis not present

## 2020-05-03 DIAGNOSIS — N049 Nephrotic syndrome with unspecified morphologic changes: Secondary | ICD-10-CM | POA: Diagnosis not present

## 2020-05-03 DIAGNOSIS — R52 Pain, unspecified: Secondary | ICD-10-CM | POA: Diagnosis not present

## 2020-05-03 DIAGNOSIS — Z79899 Other long term (current) drug therapy: Secondary | ICD-10-CM | POA: Diagnosis not present

## 2020-05-03 DIAGNOSIS — N022 Recurrent and persistent hematuria with diffuse membranous glomerulonephritis: Secondary | ICD-10-CM | POA: Diagnosis not present

## 2020-05-03 DIAGNOSIS — R768 Other specified abnormal immunological findings in serum: Secondary | ICD-10-CM | POA: Diagnosis not present

## 2020-05-03 DIAGNOSIS — R58 Hemorrhage, not elsewhere classified: Secondary | ICD-10-CM | POA: Diagnosis not present

## 2020-05-03 DIAGNOSIS — R801 Persistent proteinuria, unspecified: Secondary | ICD-10-CM | POA: Diagnosis not present

## 2020-05-03 DIAGNOSIS — Z23 Encounter for immunization: Secondary | ICD-10-CM | POA: Diagnosis not present

## 2020-05-03 DIAGNOSIS — R809 Proteinuria, unspecified: Secondary | ICD-10-CM | POA: Diagnosis not present

## 2020-05-03 DIAGNOSIS — N052 Unspecified nephritic syndrome with diffuse membranous glomerulonephritis: Secondary | ICD-10-CM | POA: Diagnosis not present

## 2020-05-03 DIAGNOSIS — Z87891 Personal history of nicotine dependence: Secondary | ICD-10-CM | POA: Diagnosis not present

## 2020-05-03 DIAGNOSIS — I129 Hypertensive chronic kidney disease with stage 1 through stage 4 chronic kidney disease, or unspecified chronic kidney disease: Secondary | ICD-10-CM | POA: Diagnosis not present

## 2020-05-16 DIAGNOSIS — N052 Unspecified nephritic syndrome with diffuse membranous glomerulonephritis: Secondary | ICD-10-CM | POA: Diagnosis not present

## 2020-05-16 DIAGNOSIS — C7A8 Other malignant neuroendocrine tumors: Secondary | ICD-10-CM | POA: Diagnosis not present

## 2020-05-16 DIAGNOSIS — D3A8 Other benign neuroendocrine tumors: Secondary | ICD-10-CM | POA: Diagnosis not present

## 2020-05-16 DIAGNOSIS — D378 Neoplasm of uncertain behavior of other specified digestive organs: Secondary | ICD-10-CM | POA: Diagnosis not present

## 2020-05-16 DIAGNOSIS — R918 Other nonspecific abnormal finding of lung field: Secondary | ICD-10-CM | POA: Diagnosis not present

## 2020-05-16 DIAGNOSIS — D137 Benign neoplasm of endocrine pancreas: Secondary | ICD-10-CM | POA: Diagnosis not present

## 2020-06-08 DIAGNOSIS — D137 Benign neoplasm of endocrine pancreas: Secondary | ICD-10-CM | POA: Diagnosis not present

## 2020-06-08 DIAGNOSIS — R58 Hemorrhage, not elsewhere classified: Secondary | ICD-10-CM | POA: Diagnosis not present

## 2020-06-08 DIAGNOSIS — N049 Nephrotic syndrome with unspecified morphologic changes: Secondary | ICD-10-CM | POA: Diagnosis not present

## 2020-06-08 DIAGNOSIS — C7A8 Other malignant neuroendocrine tumors: Secondary | ICD-10-CM | POA: Diagnosis not present

## 2020-06-08 DIAGNOSIS — E7800 Pure hypercholesterolemia, unspecified: Secondary | ICD-10-CM | POA: Diagnosis not present

## 2020-06-08 DIAGNOSIS — I2699 Other pulmonary embolism without acute cor pulmonale: Secondary | ICD-10-CM | POA: Diagnosis not present

## 2020-06-08 DIAGNOSIS — N052 Unspecified nephritic syndrome with diffuse membranous glomerulonephritis: Secondary | ICD-10-CM | POA: Diagnosis not present

## 2020-06-08 DIAGNOSIS — R768 Other specified abnormal immunological findings in serum: Secondary | ICD-10-CM | POA: Diagnosis not present

## 2020-06-08 DIAGNOSIS — R801 Persistent proteinuria, unspecified: Secondary | ICD-10-CM | POA: Diagnosis not present

## 2020-08-02 DIAGNOSIS — R52 Pain, unspecified: Secondary | ICD-10-CM | POA: Diagnosis not present

## 2020-08-02 DIAGNOSIS — D698 Other specified hemorrhagic conditions: Secondary | ICD-10-CM | POA: Diagnosis not present

## 2020-08-02 DIAGNOSIS — R768 Other specified abnormal immunological findings in serum: Secondary | ICD-10-CM | POA: Diagnosis not present

## 2020-08-31 DIAGNOSIS — R799 Abnormal finding of blood chemistry, unspecified: Secondary | ICD-10-CM | POA: Diagnosis not present

## 2020-08-31 DIAGNOSIS — N022 Recurrent and persistent hematuria with diffuse membranous glomerulonephritis: Secondary | ICD-10-CM | POA: Diagnosis not present

## 2020-08-31 DIAGNOSIS — Z79899 Other long term (current) drug therapy: Secondary | ICD-10-CM | POA: Diagnosis not present

## 2020-08-31 DIAGNOSIS — R801 Persistent proteinuria, unspecified: Secondary | ICD-10-CM | POA: Diagnosis not present

## 2020-08-31 DIAGNOSIS — N049 Nephrotic syndrome with unspecified morphologic changes: Secondary | ICD-10-CM | POA: Diagnosis not present

## 2020-08-31 DIAGNOSIS — N052 Unspecified nephritic syndrome with diffuse membranous glomerulonephritis: Secondary | ICD-10-CM | POA: Diagnosis not present

## 2020-08-31 DIAGNOSIS — N182 Chronic kidney disease, stage 2 (mild): Secondary | ICD-10-CM | POA: Diagnosis not present

## 2020-09-23 DIAGNOSIS — D3A8 Other benign neuroendocrine tumors: Secondary | ICD-10-CM | POA: Diagnosis not present

## 2020-09-23 DIAGNOSIS — R918 Other nonspecific abnormal finding of lung field: Secondary | ICD-10-CM | POA: Diagnosis not present

## 2020-09-26 DIAGNOSIS — N059 Unspecified nephritic syndrome with unspecified morphologic changes: Secondary | ICD-10-CM | POA: Diagnosis not present

## 2020-09-26 DIAGNOSIS — D3A8 Other benign neuroendocrine tumors: Secondary | ICD-10-CM | POA: Diagnosis not present

## 2020-10-06 DIAGNOSIS — L821 Other seborrheic keratosis: Secondary | ICD-10-CM | POA: Diagnosis not present

## 2020-10-06 DIAGNOSIS — D485 Neoplasm of uncertain behavior of skin: Secondary | ICD-10-CM | POA: Diagnosis not present

## 2020-10-06 DIAGNOSIS — L578 Other skin changes due to chronic exposure to nonionizing radiation: Secondary | ICD-10-CM | POA: Diagnosis not present

## 2020-12-01 DIAGNOSIS — E042 Nontoxic multinodular goiter: Secondary | ICD-10-CM | POA: Diagnosis not present

## 2020-12-01 DIAGNOSIS — R768 Other specified abnormal immunological findings in serum: Secondary | ICD-10-CM | POA: Diagnosis not present

## 2020-12-01 DIAGNOSIS — I10 Essential (primary) hypertension: Secondary | ICD-10-CM | POA: Diagnosis not present

## 2020-12-01 DIAGNOSIS — M7552 Bursitis of left shoulder: Secondary | ICD-10-CM | POA: Diagnosis not present

## 2020-12-01 DIAGNOSIS — Z79899 Other long term (current) drug therapy: Secondary | ICD-10-CM | POA: Diagnosis not present

## 2020-12-01 DIAGNOSIS — M353 Polymyalgia rheumatica: Secondary | ICD-10-CM | POA: Diagnosis not present

## 2020-12-01 DIAGNOSIS — M7551 Bursitis of right shoulder: Secondary | ICD-10-CM | POA: Diagnosis not present

## 2020-12-01 DIAGNOSIS — M7062 Trochanteric bursitis, left hip: Secondary | ICD-10-CM | POA: Diagnosis not present

## 2020-12-01 DIAGNOSIS — Z87891 Personal history of nicotine dependence: Secondary | ICD-10-CM | POA: Diagnosis not present

## 2020-12-01 DIAGNOSIS — Z7952 Long term (current) use of systemic steroids: Secondary | ICD-10-CM | POA: Diagnosis not present

## 2020-12-01 DIAGNOSIS — M7061 Trochanteric bursitis, right hip: Secondary | ICD-10-CM | POA: Diagnosis not present

## 2020-12-01 DIAGNOSIS — E041 Nontoxic single thyroid nodule: Secondary | ICD-10-CM | POA: Diagnosis not present

## 2020-12-01 DIAGNOSIS — N052 Unspecified nephritic syndrome with diffuse membranous glomerulonephritis: Secondary | ICD-10-CM | POA: Diagnosis not present

## 2020-12-27 DIAGNOSIS — R42 Dizziness and giddiness: Secondary | ICD-10-CM | POA: Diagnosis not present

## 2020-12-27 DIAGNOSIS — Z6826 Body mass index (BMI) 26.0-26.9, adult: Secondary | ICD-10-CM | POA: Diagnosis not present

## 2020-12-27 DIAGNOSIS — H6980 Other specified disorders of Eustachian tube, unspecified ear: Secondary | ICD-10-CM | POA: Diagnosis not present

## 2021-01-19 DIAGNOSIS — R801 Persistent proteinuria, unspecified: Secondary | ICD-10-CM | POA: Diagnosis not present

## 2021-01-19 DIAGNOSIS — R799 Abnormal finding of blood chemistry, unspecified: Secondary | ICD-10-CM | POA: Diagnosis not present

## 2021-01-19 DIAGNOSIS — N052 Unspecified nephritic syndrome with diffuse membranous glomerulonephritis: Secondary | ICD-10-CM | POA: Diagnosis not present

## 2021-01-19 DIAGNOSIS — N182 Chronic kidney disease, stage 2 (mild): Secondary | ICD-10-CM | POA: Diagnosis not present

## 2021-02-06 DIAGNOSIS — E559 Vitamin D deficiency, unspecified: Secondary | ICD-10-CM | POA: Diagnosis not present

## 2021-02-06 DIAGNOSIS — E041 Nontoxic single thyroid nodule: Secondary | ICD-10-CM | POA: Diagnosis not present

## 2021-02-06 DIAGNOSIS — N401 Enlarged prostate with lower urinary tract symptoms: Secondary | ICD-10-CM | POA: Diagnosis not present

## 2021-03-15 DIAGNOSIS — H2513 Age-related nuclear cataract, bilateral: Secondary | ICD-10-CM | POA: Diagnosis not present

## 2021-03-15 DIAGNOSIS — H35373 Puckering of macula, bilateral: Secondary | ICD-10-CM | POA: Diagnosis not present

## 2021-04-11 DIAGNOSIS — E559 Vitamin D deficiency, unspecified: Secondary | ICD-10-CM | POA: Diagnosis not present

## 2021-04-11 DIAGNOSIS — N052 Unspecified nephritic syndrome with diffuse membranous glomerulonephritis: Secondary | ICD-10-CM | POA: Diagnosis not present

## 2021-04-11 DIAGNOSIS — Z79899 Other long term (current) drug therapy: Secondary | ICD-10-CM | POA: Diagnosis not present

## 2021-04-11 DIAGNOSIS — Z0000 Encounter for general adult medical examination without abnormal findings: Secondary | ICD-10-CM | POA: Diagnosis not present

## 2021-04-11 DIAGNOSIS — E782 Mixed hyperlipidemia: Secondary | ICD-10-CM | POA: Diagnosis not present

## 2021-04-11 DIAGNOSIS — M5412 Radiculopathy, cervical region: Secondary | ICD-10-CM | POA: Diagnosis not present

## 2021-04-11 DIAGNOSIS — N049 Nephrotic syndrome with unspecified morphologic changes: Secondary | ICD-10-CM | POA: Diagnosis not present

## 2021-04-11 DIAGNOSIS — E041 Nontoxic single thyroid nodule: Secondary | ICD-10-CM | POA: Diagnosis not present

## 2021-04-11 DIAGNOSIS — M353 Polymyalgia rheumatica: Secondary | ICD-10-CM | POA: Diagnosis not present

## 2021-04-25 DIAGNOSIS — I10 Essential (primary) hypertension: Secondary | ICD-10-CM | POA: Diagnosis not present

## 2021-04-25 DIAGNOSIS — N182 Chronic kidney disease, stage 2 (mild): Secondary | ICD-10-CM | POA: Diagnosis not present

## 2021-04-25 DIAGNOSIS — D6869 Other thrombophilia: Secondary | ICD-10-CM | POA: Diagnosis not present

## 2021-04-25 DIAGNOSIS — D8989 Other specified disorders involving the immune mechanism, not elsewhere classified: Secondary | ICD-10-CM | POA: Diagnosis not present

## 2021-04-25 DIAGNOSIS — N052 Unspecified nephritic syndrome with diffuse membranous glomerulonephritis: Secondary | ICD-10-CM | POA: Diagnosis not present

## 2021-04-25 DIAGNOSIS — Z0184 Encounter for antibody response examination: Secondary | ICD-10-CM | POA: Diagnosis not present

## 2021-05-15 DIAGNOSIS — Z79899 Other long term (current) drug therapy: Secondary | ICD-10-CM | POA: Diagnosis not present

## 2021-05-15 DIAGNOSIS — Z87891 Personal history of nicotine dependence: Secondary | ICD-10-CM | POA: Diagnosis not present

## 2021-05-15 DIAGNOSIS — N052 Unspecified nephritic syndrome with diffuse membranous glomerulonephritis: Secondary | ICD-10-CM | POA: Diagnosis not present

## 2021-05-15 DIAGNOSIS — K769 Liver disease, unspecified: Secondary | ICD-10-CM | POA: Diagnosis not present

## 2021-05-15 DIAGNOSIS — Z7901 Long term (current) use of anticoagulants: Secondary | ICD-10-CM | POA: Diagnosis not present

## 2021-05-15 DIAGNOSIS — R918 Other nonspecific abnormal finding of lung field: Secondary | ICD-10-CM | POA: Diagnosis not present

## 2021-05-15 DIAGNOSIS — C7A8 Other malignant neuroendocrine tumors: Secondary | ICD-10-CM | POA: Diagnosis not present

## 2021-05-15 DIAGNOSIS — Z9049 Acquired absence of other specified parts of digestive tract: Secondary | ICD-10-CM | POA: Diagnosis not present

## 2021-05-15 DIAGNOSIS — D137 Benign neoplasm of endocrine pancreas: Secondary | ICD-10-CM | POA: Diagnosis not present

## 2021-08-01 DIAGNOSIS — N022 Recurrent and persistent hematuria with diffuse membranous glomerulonephritis: Secondary | ICD-10-CM | POA: Diagnosis not present

## 2021-08-01 DIAGNOSIS — N182 Chronic kidney disease, stage 2 (mild): Secondary | ICD-10-CM | POA: Diagnosis not present

## 2021-08-01 DIAGNOSIS — M353 Polymyalgia rheumatica: Secondary | ICD-10-CM | POA: Diagnosis not present

## 2021-08-01 DIAGNOSIS — Z7960 Long term (current) use of unspecified immunomodulators and immunosuppressants: Secondary | ICD-10-CM | POA: Diagnosis not present

## 2021-08-01 DIAGNOSIS — N049 Nephrotic syndrome with unspecified morphologic changes: Secondary | ICD-10-CM | POA: Diagnosis not present

## 2021-08-01 DIAGNOSIS — N052 Unspecified nephritic syndrome with diffuse membranous glomerulonephritis: Secondary | ICD-10-CM | POA: Diagnosis not present

## 2021-09-27 DIAGNOSIS — N059 Unspecified nephritic syndrome with unspecified morphologic changes: Secondary | ICD-10-CM | POA: Diagnosis not present

## 2021-09-27 DIAGNOSIS — D3A8 Other benign neuroendocrine tumors: Secondary | ICD-10-CM | POA: Diagnosis not present

## 2021-10-16 DIAGNOSIS — L578 Other skin changes due to chronic exposure to nonionizing radiation: Secondary | ICD-10-CM | POA: Diagnosis not present

## 2021-10-16 DIAGNOSIS — L820 Inflamed seborrheic keratosis: Secondary | ICD-10-CM | POA: Diagnosis not present

## 2021-10-16 DIAGNOSIS — L821 Other seborrheic keratosis: Secondary | ICD-10-CM | POA: Diagnosis not present

## 2021-10-16 DIAGNOSIS — L219 Seborrheic dermatitis, unspecified: Secondary | ICD-10-CM | POA: Diagnosis not present

## 2021-10-16 DIAGNOSIS — D225 Melanocytic nevi of trunk: Secondary | ICD-10-CM | POA: Diagnosis not present

## 2021-11-10 DIAGNOSIS — Z6825 Body mass index (BMI) 25.0-25.9, adult: Secondary | ICD-10-CM | POA: Diagnosis not present

## 2021-11-10 DIAGNOSIS — H65191 Other acute nonsuppurative otitis media, right ear: Secondary | ICD-10-CM | POA: Diagnosis not present

## 2022-02-20 DIAGNOSIS — N052 Unspecified nephritic syndrome with diffuse membranous glomerulonephritis: Secondary | ICD-10-CM | POA: Diagnosis not present

## 2022-02-20 DIAGNOSIS — N2581 Secondary hyperparathyroidism of renal origin: Secondary | ICD-10-CM | POA: Diagnosis not present

## 2022-02-20 DIAGNOSIS — R944 Abnormal results of kidney function studies: Secondary | ICD-10-CM | POA: Diagnosis not present

## 2022-03-16 DIAGNOSIS — B356 Tinea cruris: Secondary | ICD-10-CM | POA: Diagnosis not present

## 2022-03-21 DIAGNOSIS — H35373 Puckering of macula, bilateral: Secondary | ICD-10-CM | POA: Diagnosis not present

## 2022-03-21 DIAGNOSIS — H2513 Age-related nuclear cataract, bilateral: Secondary | ICD-10-CM | POA: Diagnosis not present

## 2022-03-26 DIAGNOSIS — Z9225 Personal history of immunosupression therapy: Secondary | ICD-10-CM | POA: Diagnosis not present

## 2022-03-26 DIAGNOSIS — I10 Essential (primary) hypertension: Secondary | ICD-10-CM | POA: Diagnosis not present

## 2022-03-26 DIAGNOSIS — D649 Anemia, unspecified: Secondary | ICD-10-CM | POA: Diagnosis not present

## 2022-03-26 DIAGNOSIS — M25521 Pain in right elbow: Secondary | ICD-10-CM | POA: Diagnosis not present

## 2022-03-26 DIAGNOSIS — Z87891 Personal history of nicotine dependence: Secondary | ICD-10-CM | POA: Diagnosis not present

## 2022-03-26 DIAGNOSIS — M25522 Pain in left elbow: Secondary | ICD-10-CM | POA: Diagnosis not present

## 2022-03-26 DIAGNOSIS — M25531 Pain in right wrist: Secondary | ICD-10-CM | POA: Diagnosis not present

## 2022-03-26 DIAGNOSIS — M25532 Pain in left wrist: Secondary | ICD-10-CM | POA: Diagnosis not present

## 2022-03-26 DIAGNOSIS — Z79899 Other long term (current) drug therapy: Secondary | ICD-10-CM | POA: Diagnosis not present

## 2022-03-26 DIAGNOSIS — Z7952 Long term (current) use of systemic steroids: Secondary | ICD-10-CM | POA: Diagnosis not present

## 2022-03-26 DIAGNOSIS — Z1159 Encounter for screening for other viral diseases: Secondary | ICD-10-CM | POA: Diagnosis not present

## 2022-03-26 DIAGNOSIS — R5381 Other malaise: Secondary | ICD-10-CM | POA: Diagnosis not present

## 2022-03-26 DIAGNOSIS — M0600 Rheumatoid arthritis without rheumatoid factor, unspecified site: Secondary | ICD-10-CM | POA: Diagnosis not present

## 2022-04-20 DIAGNOSIS — N052 Unspecified nephritic syndrome with diffuse membranous glomerulonephritis: Secondary | ICD-10-CM | POA: Diagnosis not present

## 2022-05-04 DIAGNOSIS — N052 Unspecified nephritic syndrome with diffuse membranous glomerulonephritis: Secondary | ICD-10-CM | POA: Diagnosis not present

## 2022-05-14 DIAGNOSIS — Z7952 Long term (current) use of systemic steroids: Secondary | ICD-10-CM | POA: Diagnosis not present

## 2022-05-14 DIAGNOSIS — Z87891 Personal history of nicotine dependence: Secondary | ICD-10-CM | POA: Diagnosis not present

## 2022-05-14 DIAGNOSIS — C7A8 Other malignant neuroendocrine tumors: Secondary | ICD-10-CM | POA: Diagnosis not present

## 2022-05-14 DIAGNOSIS — M069 Rheumatoid arthritis, unspecified: Secondary | ICD-10-CM | POA: Diagnosis not present

## 2022-05-14 DIAGNOSIS — R1900 Intra-abdominal and pelvic swelling, mass and lump, unspecified site: Secondary | ICD-10-CM | POA: Diagnosis not present

## 2022-05-14 DIAGNOSIS — C7B8 Other secondary neuroendocrine tumors: Secondary | ICD-10-CM | POA: Diagnosis not present

## 2022-05-14 DIAGNOSIS — D137 Benign neoplasm of endocrine pancreas: Secondary | ICD-10-CM | POA: Diagnosis not present

## 2022-05-25 ENCOUNTER — Other Ambulatory Visit (HOSPITAL_BASED_OUTPATIENT_CLINIC_OR_DEPARTMENT_OTHER): Payer: Self-pay

## 2022-05-25 ENCOUNTER — Emergency Department (HOSPITAL_BASED_OUTPATIENT_CLINIC_OR_DEPARTMENT_OTHER)
Admission: EM | Admit: 2022-05-25 | Discharge: 2022-05-25 | Disposition: A | Discharge status: Home / Self Care | Payer: Medicare Other | Attending: Emergency Medicine | Admitting: Emergency Medicine

## 2022-05-25 ENCOUNTER — Emergency Department (HOSPITAL_BASED_OUTPATIENT_CLINIC_OR_DEPARTMENT_OTHER): Payer: Medicare Other

## 2022-05-25 ENCOUNTER — Encounter (HOSPITAL_BASED_OUTPATIENT_CLINIC_OR_DEPARTMENT_OTHER): Payer: Self-pay

## 2022-05-25 ENCOUNTER — Other Ambulatory Visit: Payer: Self-pay

## 2022-05-25 DIAGNOSIS — M4802 Spinal stenosis, cervical region: Secondary | ICD-10-CM

## 2022-05-25 DIAGNOSIS — M542 Cervicalgia: Secondary | ICD-10-CM | POA: Diagnosis not present

## 2022-05-25 DIAGNOSIS — M62838 Other muscle spasm: Secondary | ICD-10-CM | POA: Diagnosis not present

## 2022-05-25 DIAGNOSIS — M9971 Connective tissue and disc stenosis of intervertebral foramina of cervical region: Secondary | ICD-10-CM | POA: Diagnosis not present

## 2022-05-25 DIAGNOSIS — Z7901 Long term (current) use of anticoagulants: Secondary | ICD-10-CM | POA: Insufficient documentation

## 2022-05-25 DIAGNOSIS — M5412 Radiculopathy, cervical region: Secondary | ICD-10-CM | POA: Diagnosis not present

## 2022-05-25 MED ORDER — LIDOCAINE 5 % EX PTCH
1.0000 | MEDICATED_PATCH | CUTANEOUS | Status: DC
  Administered 2022-05-25: 1 via TRANSDERMAL
  Filled 2022-05-25: qty 1

## 2022-05-25 MED ORDER — CYCLOBENZAPRINE HCL 5 MG PO TABS
5.0000 mg | ORAL_TABLET | Freq: Two times a day (BID) | ORAL | 0 refills | Status: DC | PRN
  Filled 2022-05-25: qty 14, 7d supply, fill #0

## 2022-05-25 MED ORDER — OXYCODONE HCL 5 MG PO TABS
5.0000 mg | ORAL_TABLET | Freq: Four times a day (QID) | ORAL | 0 refills | Status: DC | PRN
  Filled 2022-05-25: qty 8, 2d supply, fill #0

## 2022-05-25 MED ORDER — LIDOCAINE 5 % EX PTCH
1.0000 | MEDICATED_PATCH | CUTANEOUS | 0 refills | Status: DC
  Filled 2022-05-25: qty 15, 15d supply, fill #0

## 2022-05-25 NOTE — ED Provider Notes (Signed)
[facility] Provider Note   CSN: [id number] Arrival date & time: 05/25/22  1423     History  Chief Complaint  Patient presents with   Neck Pain    Stephen Gross is a 77 y.o. male.  Patient with history of rheumatoid arthritis on prednisone, h/o Whipple procedure, on chronic anticoagulation, membranous glomerulonephritis --presents to the emergency department today for evaluation of left-sided neck pain.  Symptoms started about 3 days ago, gradually and has worsened.  Patient has worse pain with movement and palpation of the upper back and left side of the neck.  No falls or injuries.  No vision changes.  No weakness in the arms of the legs.  Denies chest pain or shortness of breath.  States difficulty sleeping due to pain.  He has applied Biofreeze without improvement.       Home Medications Prior to Admission medications   Medication Sig Start Date End Date Taking? Authorizing Provider  acetaminophen (TYLENOL) 650 MG CR tablet Take 650-1,300 mg by mouth every 8 (eight) hours as needed for pain.    [provider]  atorvastatin (LIPITOR) 40 MG tablet Take 40 mg by mouth at bedtime.  12/06/17   [provider]  furosemide (LASIX) 20 MG tablet Take 20 mg by mouth daily.    [provider]  oxyCODONE (OXY IR/ROXICODONE) 5 MG immediate release tablet Take 1 tablet (5 mg total) by mouth every 6 (six) hours as needed for severe pain or breakthrough pain. 03/19/19   Greer Pickerel, MD  tamsulosin (FLOMAX) 0.4 MG CAPS capsule Take 0.4 mg by mouth at bedtime.  09/15/17   [provider]  valsartan (DIOVAN) 320 MG tablet Take 320 mg by mouth daily. 12/19/18   [provider]  XARELTO 20 MG TABS tablet Take 20 mg by mouth daily. 02/20/19   [provider]      Allergies    Lisinopril and Nsaids    Review of Systems   Review of Systems  Physical Exam Updated Vital Signs BP 134/78 (BP Location:  Right Arm)   Pulse 66   Temp (!) 97.3 F (36.3 C) (Temporal)   Resp 18   Ht 6' 1"$  (1.854 m)   Wt 86.2 kg   SpO2 97%   BMI 25.07 kg/m  Physical Exam Vitals and nursing note reviewed.  Constitutional:      Appearance: He is well-developed.  HENT:     Head: Normocephalic and atraumatic.  Eyes:     Conjunctiva/sclera: Conjunctivae normal.  Neck:     Comments: No carotid bruits noted Pulmonary:     Effort: No respiratory distress.  Musculoskeletal:     Cervical back: Neck supple. Spasms and tenderness present. No bony tenderness. Decreased range of motion.       Back:     Comments: Patient winces with rotation of the neck  Skin:    General: Skin is warm and dry.  Neurological:     Mental Status: He is alert.     ED Results / Procedures / Treatments   Labs (all labs ordered are listed, but only abnormal results are displayed) Labs Reviewed - No data to display  EKG None  Radiology No results found.  Procedures Procedures    Medications Ordered in ED Medications - No data to display  ED Course/ Medical Decision Making/ A&P    Patient seen and examined. History obtained directly from patient.   Labs/EKG: None ordered  Imaging: Ordered  CT cervical spine  Medications/Fluids: None ordered  Most recent vital signs reviewed and are as follows: BP 134/78 (BP Location: Right Arm)   Pulse 66   Temp (!) 97.3 F (36.3 C) (Temporal)   Resp 18   Ht 6' 1"$  (1.854 m)   Wt 86.2 kg   SpO2 97%   BMI 25.07 kg/m   Initial impression: Left neck pain, suspect MSK pain  Given severity of symptoms, age -- will eval with CT as plain films unlikely to be revealing.   4:15 PM Reassessment performed. Patient appears stable, still uncomfortable with movement.  Imaging personally visualized and interpreted including: CT cervical spine, agree stenosis, degenerative changes  Reviewed pertinent lab work and imaging with patient at bedside. Questions answered.   Most  current vital signs reviewed and are as follows: BP 134/78 (BP Location: Right Arm)   Pulse 66   Temp (!) 97.3 F (36.3 C) (Temporal)   Resp 18   Ht 6' 1"$  (1.854 m)   Wt 86.2 kg   SpO2 97%   BMI 25.07 kg/m   Plan: Discharge to home.   Prescriptions written for: Flexeril, oxycodone # 8 tablets, Lidoderm patch  Patient counseled on use of narcotic pain medications. Counseled not to combine these medications with others containing tylenol. Urged not to drink alcohol, drive, or perform any other activities that requires focus while taking these medications. The patient verbalizes understanding and agrees with the plan.  Other home care instructions discussed: Rest, heat  ED return instructions discussed: Patient counseled to return if they have weakness in their arms or legs, slurred speech, trouble walking or talking, confusion, trouble with their balance, or if they have any other concerns. Patient verbalizes understanding and agrees with plan.   Follow-up instructions discussed: Patient encouraged to follow-up with their PCP in 5 days.    Click here for ABCD2, HEART and other calculatorsREFRESH Note before signing :1}                          Medical Decision Making Amount and/or Complexity of Data Reviewed Radiology: ordered.   Patient with left-sided cervical pain with palpable muscle spasm.  CT demonstrates significant foraminal stenosis on the left side at 2 different levels.  No compression fractures.  Clinically have low concern for vertebral or carotid artery dissection.  No strokelike symptoms.  Patient will be treated symptomatically.  Already on prednisone.  The patient's vital signs, pertinent lab work and imaging were reviewed and interpreted as discussed in the ED course. Hospitalization was considered for further testing, treatments, or serial exams/observation. However as patient is well-appearing, has a stable exam, and reassuring studies today, I do not feel that  they warrant admission at this time. This plan was discussed with the patient who verbalizes agreement and comfort with this plan and seems reliable and able to return to the Emergency Department with worsening or changing symptoms.          Final Clinical Impression(s) / ED Diagnoses Final diagnoses:  Neck pain  Cervical paraspinal muscle spasm  Foraminal stenosis of cervical region    Rx / DC Orders ED Discharge Orders          Ordered    oxyCODONE (OXY IR/ROXICODONE) 5 MG immediate release tablet  Every 6 hours PRN        05/25/22 1615    cyclobenzaprine (FLEXERIL) 5 MG tablet  2 times daily PRN  05/25/22 1615    lidocaine (LIDODERM) 5 %  Every 24 hours        05/25/22 1615              Carlisle Cater, Hershal Coria 05/25/22 1618    Dorie Rank, MD 05/27/22 [phone]

## 2022-05-25 NOTE — ED Triage Notes (Signed)
Patient here POV from home.  Endorses Neck Discomfort that began 3 Days ago. Mostly Left Sided and radiates to Left Shoulder at times.   History of RA. No Known Trauma or Injury.   NAD Noted During Triage, A&Ox4. GCS 15. Ambulatory.

## 2022-05-25 NOTE — Discharge Instructions (Signed)
Please read and follow all provided instructions.  Your diagnoses today include:  1. Neck pain   2. Cervical paraspinal muscle spasm   3. Foraminal stenosis of cervical region     Tests performed today include: CT scan of your neck: Shows arthritis in the neck and nerves that could potentially be being pinched at the C4-5 and C6-7 levels.  No broken bones or fracture seen. Vital signs. See below for your results today.   Medications prescribed:  Flexeril (cyclobenzaprine) - muscle relaxer medication  DO NOT drive or perform any activities that require you to be awake and alert because this medicine can make you drowsy.   Oxycodone - narcotic pain medication  DO NOT drive or perform any activities that require you to be awake and alert because this medicine can make you drowsy.   Take any prescribed medications only as directed.  Home care instructions:  Follow any educational materials contained in this packet.  BE VERY CAREFUL not to take multiple medicines containing Tylenol (also called acetaminophen). Doing so can lead to an overdose which can damage your liver and cause liver failure and possibly death.   Follow-up instructions: Please follow-up with your primary care provider in the next 3 days for further evaluation of your symptoms.   Return instructions:  Please return to the Emergency Department if you experience worsening symptoms.  Please return if you have any other emergent concerns.  Additional Information:  Your vital signs today were: BP 134/78 (BP Location: Right Arm)   Pulse 66   Temp (!) 97.3 F (36.3 C) (Temporal)   Resp 18   Ht 6' 1"$  (1.854 m)   Wt 86.2 kg   SpO2 97%   BMI 25.07 kg/m  If your blood pressure (BP) was elevated above 135/85 this visit, please have this repeated by your doctor within one month. --------------

## 2022-07-17 ENCOUNTER — Ambulatory Visit: Payer: Self-pay | Admitting: *Deleted

## 2022-07-17 DIAGNOSIS — S61210A Laceration without foreign body of right index finger without damage to nail, initial encounter: Secondary | ICD-10-CM | POA: Diagnosis not present

## 2022-07-17 NOTE — Telephone Encounter (Signed)
  Chief Complaint: cut will not stop bleeding  Symptoms: hit top of hand with hammer and started bleeding took 1/2 hour to stop bleeding, wife put bandaid over after apply ointment and now bleeding started again and can not make it stop. Patient currently taking xarelto 20 mg daily  Frequency: today at 2 pm Pertinent Negatives: Patient denies severe bleeding  Disposition: [x] ED /[] Urgent Care (no appt availability in office) / [] Appointment(In office/virtual)/ []  [facility]/ [] Home Care/ [] Refused Recommended Disposition /[] [facility]/ []  Follow-up with PCP Additional Notes:   Recommended ED. Patient 's wife does not want to go to ED of nearest location. Recommended to apply small towel over cut and apply pressure until going to ED of choice. Probably [facility].    Reason for Disposition  [1] Bleeding AND [2] won't stop after 10 minutes of direct pressure (using correct technique)  Answer Assessment - Initial Assessment Questions 1. APPEARANCE of INJURY: "What does the injury look like?"      Cut on top of hand from a hammer 2. SIZE: "How large is the cut?"      Quarter of inch long  3. BLEEDING: "Is it bleeding now?" If Yes, ask: "Is it difficult to stop?"      Yes difficulty to stop . Initial bleeding time took 1/2 hour to stop bleeding and now started again 4. LOCATION: "Where is the injury located?"      Top of hand 5. ONSET: "How long ago did the injury occur?"      2 pm today  6. MECHANISM: "Tell me how it happened."      Hit top of hand with hammer  7. TETANUS: "When was the last tetanus booster?"     Na  8. PREGNANCY: "Is there any chance you are pregnant?" "When was your last menstrual period?"     na  Protocols used: Cuts and Lacerations-A-AH

## 2022-07-26 DIAGNOSIS — Z7952 Long term (current) use of systemic steroids: Secondary | ICD-10-CM | POA: Diagnosis not present

## 2022-07-26 DIAGNOSIS — M47892 Other spondylosis, cervical region: Secondary | ICD-10-CM | POA: Diagnosis not present

## 2022-07-26 DIAGNOSIS — M0600 Rheumatoid arthritis without rheumatoid factor, unspecified site: Secondary | ICD-10-CM | POA: Diagnosis not present

## 2022-08-15 DIAGNOSIS — Z90410 Acquired total absence of pancreas: Secondary | ICD-10-CM | POA: Diagnosis not present

## 2022-08-15 DIAGNOSIS — Z79899 Other long term (current) drug therapy: Secondary | ICD-10-CM | POA: Diagnosis not present

## 2022-08-15 DIAGNOSIS — R509 Fever, unspecified: Secondary | ICD-10-CM | POA: Diagnosis not present

## 2022-08-15 DIAGNOSIS — K769 Liver disease, unspecified: Secondary | ICD-10-CM | POA: Diagnosis present

## 2022-08-15 DIAGNOSIS — R112 Nausea with vomiting, unspecified: Secondary | ICD-10-CM | POA: Diagnosis not present

## 2022-08-15 DIAGNOSIS — D137 Benign neoplasm of endocrine pancreas: Secondary | ICD-10-CM | POA: Diagnosis not present

## 2022-08-15 DIAGNOSIS — I129 Hypertensive chronic kidney disease with stage 1 through stage 4 chronic kidney disease, or unspecified chronic kidney disease: Secondary | ICD-10-CM | POA: Diagnosis present

## 2022-08-15 DIAGNOSIS — Z7901 Long term (current) use of anticoagulants: Secondary | ICD-10-CM | POA: Diagnosis not present

## 2022-08-15 DIAGNOSIS — D539 Nutritional anemia, unspecified: Secondary | ICD-10-CM | POA: Diagnosis not present

## 2022-08-15 DIAGNOSIS — M0600 Rheumatoid arthritis without rheumatoid factor, unspecified site: Secondary | ICD-10-CM | POA: Diagnosis present

## 2022-08-15 DIAGNOSIS — R7881 Bacteremia: Secondary | ICD-10-CM | POA: Diagnosis present

## 2022-08-15 DIAGNOSIS — R079 Chest pain, unspecified: Secondary | ICD-10-CM | POA: Diagnosis not present

## 2022-08-15 DIAGNOSIS — R109 Unspecified abdominal pain: Secondary | ICD-10-CM | POA: Diagnosis not present

## 2022-08-15 DIAGNOSIS — Z86711 Personal history of pulmonary embolism: Secondary | ICD-10-CM | POA: Diagnosis not present

## 2022-08-15 DIAGNOSIS — N042 Nephrotic syndrome with diffuse membranous glomerulonephritis, unspecified: Secondary | ICD-10-CM | POA: Diagnosis not present

## 2022-08-15 DIAGNOSIS — D508 Other iron deficiency anemias: Secondary | ICD-10-CM | POA: Diagnosis not present

## 2022-08-15 DIAGNOSIS — Z87891 Personal history of nicotine dependence: Secondary | ICD-10-CM | POA: Diagnosis not present

## 2022-08-15 DIAGNOSIS — N189 Chronic kidney disease, unspecified: Secondary | ICD-10-CM | POA: Diagnosis present

## 2022-08-15 DIAGNOSIS — R0600 Dyspnea, unspecified: Secondary | ICD-10-CM | POA: Diagnosis not present

## 2022-08-15 DIAGNOSIS — E785 Hyperlipidemia, unspecified: Secondary | ICD-10-CM | POA: Diagnosis present

## 2022-08-15 DIAGNOSIS — N049 Nephrotic syndrome with unspecified morphologic changes: Secondary | ICD-10-CM | POA: Diagnosis not present

## 2022-08-15 DIAGNOSIS — M353 Polymyalgia rheumatica: Secondary | ICD-10-CM | POA: Diagnosis not present

## 2022-08-15 DIAGNOSIS — Z9049 Acquired absence of other specified parts of digestive tract: Secondary | ICD-10-CM | POA: Diagnosis not present

## 2022-08-15 DIAGNOSIS — B9620 Unspecified Escherichia coli [E. coli] as the cause of diseases classified elsewhere: Secondary | ICD-10-CM | POA: Diagnosis present

## 2022-08-15 DIAGNOSIS — Z86718 Personal history of other venous thrombosis and embolism: Secondary | ICD-10-CM | POA: Diagnosis not present

## 2022-08-15 DIAGNOSIS — D649 Anemia, unspecified: Secondary | ICD-10-CM | POA: Diagnosis present

## 2022-08-15 DIAGNOSIS — N052 Unspecified nephritic syndrome with diffuse membranous glomerulonephritis: Secondary | ICD-10-CM | POA: Diagnosis not present

## 2022-08-15 DIAGNOSIS — N400 Enlarged prostate without lower urinary tract symptoms: Secondary | ICD-10-CM | POA: Diagnosis present

## 2022-08-16 DIAGNOSIS — Z90410 Acquired total absence of pancreas: Secondary | ICD-10-CM

## 2022-08-16 DIAGNOSIS — R079 Chest pain, unspecified: Secondary | ICD-10-CM | POA: Diagnosis not present

## 2022-08-16 DIAGNOSIS — R0600 Dyspnea, unspecified: Secondary | ICD-10-CM | POA: Diagnosis not present

## 2022-08-16 DIAGNOSIS — Z7901 Long term (current) use of anticoagulants: Secondary | ICD-10-CM | POA: Diagnosis not present

## 2022-08-16 DIAGNOSIS — I129 Hypertensive chronic kidney disease with stage 1 through stage 4 chronic kidney disease, or unspecified chronic kidney disease: Secondary | ICD-10-CM | POA: Diagnosis present

## 2022-08-16 DIAGNOSIS — D649 Anemia, unspecified: Secondary | ICD-10-CM | POA: Diagnosis present

## 2022-08-16 DIAGNOSIS — R7881 Bacteremia: Secondary | ICD-10-CM | POA: Diagnosis present

## 2022-08-16 DIAGNOSIS — D137 Benign neoplasm of endocrine pancreas: Secondary | ICD-10-CM | POA: Diagnosis not present

## 2022-08-16 DIAGNOSIS — Z9049 Acquired absence of other specified parts of digestive tract: Secondary | ICD-10-CM | POA: Diagnosis not present

## 2022-08-16 DIAGNOSIS — N400 Enlarged prostate without lower urinary tract symptoms: Secondary | ICD-10-CM | POA: Diagnosis present

## 2022-08-16 DIAGNOSIS — B9620 Unspecified Escherichia coli [E. coli] as the cause of diseases classified elsewhere: Secondary | ICD-10-CM | POA: Diagnosis present

## 2022-08-16 DIAGNOSIS — Z86711 Personal history of pulmonary embolism: Secondary | ICD-10-CM | POA: Diagnosis not present

## 2022-08-16 DIAGNOSIS — Z87891 Personal history of nicotine dependence: Secondary | ICD-10-CM | POA: Diagnosis not present

## 2022-08-16 DIAGNOSIS — E785 Hyperlipidemia, unspecified: Secondary | ICD-10-CM | POA: Diagnosis present

## 2022-08-16 DIAGNOSIS — I10 Essential (primary) hypertension: Secondary | ICD-10-CM | POA: Diagnosis present

## 2022-08-16 DIAGNOSIS — Z79899 Other long term (current) drug therapy: Secondary | ICD-10-CM | POA: Diagnosis not present

## 2022-08-16 DIAGNOSIS — N042 Nephrotic syndrome with diffuse membranous glomerulonephritis, unspecified: Secondary | ICD-10-CM | POA: Diagnosis not present

## 2022-08-16 DIAGNOSIS — Z86718 Personal history of other venous thrombosis and embolism: Secondary | ICD-10-CM | POA: Diagnosis not present

## 2022-08-16 DIAGNOSIS — M0600 Rheumatoid arthritis without rheumatoid factor, unspecified site: Secondary | ICD-10-CM | POA: Diagnosis present

## 2022-08-16 DIAGNOSIS — K769 Liver disease, unspecified: Secondary | ICD-10-CM | POA: Diagnosis present

## 2022-08-16 DIAGNOSIS — R109 Unspecified abdominal pain: Secondary | ICD-10-CM | POA: Diagnosis not present

## 2022-08-16 DIAGNOSIS — N189 Chronic kidney disease, unspecified: Secondary | ICD-10-CM | POA: Diagnosis present

## 2022-08-17 DIAGNOSIS — R7881 Bacteremia: Secondary | ICD-10-CM | POA: Diagnosis not present

## 2022-08-17 DIAGNOSIS — Z86711 Personal history of pulmonary embolism: Secondary | ICD-10-CM | POA: Diagnosis not present

## 2022-08-17 DIAGNOSIS — Z90410 Acquired total absence of pancreas: Secondary | ICD-10-CM | POA: Diagnosis not present

## 2022-08-17 DIAGNOSIS — Z9049 Acquired absence of other specified parts of digestive tract: Secondary | ICD-10-CM | POA: Diagnosis not present

## 2022-08-17 DIAGNOSIS — M0600 Rheumatoid arthritis without rheumatoid factor, unspecified site: Secondary | ICD-10-CM | POA: Diagnosis not present

## 2022-08-17 DIAGNOSIS — Z7901 Long term (current) use of anticoagulants: Secondary | ICD-10-CM | POA: Diagnosis not present

## 2022-08-18 DIAGNOSIS — D137 Benign neoplasm of endocrine pancreas: Secondary | ICD-10-CM | POA: Diagnosis not present

## 2022-08-18 DIAGNOSIS — R7881 Bacteremia: Secondary | ICD-10-CM | POA: Diagnosis not present

## 2022-08-18 DIAGNOSIS — N042 Nephrotic syndrome with diffuse membranous glomerulonephritis, unspecified: Secondary | ICD-10-CM | POA: Diagnosis not present

## 2022-08-18 DIAGNOSIS — Z9049 Acquired absence of other specified parts of digestive tract: Secondary | ICD-10-CM | POA: Diagnosis not present

## 2022-08-18 DIAGNOSIS — Z90410 Acquired total absence of pancreas: Secondary | ICD-10-CM | POA: Diagnosis not present

## 2022-08-18 DIAGNOSIS — B9620 Unspecified Escherichia coli [E. coli] as the cause of diseases classified elsewhere: Secondary | ICD-10-CM | POA: Diagnosis not present

## 2022-08-21 DIAGNOSIS — D137 Benign neoplasm of endocrine pancreas: Secondary | ICD-10-CM | POA: Diagnosis not present

## 2022-08-21 DIAGNOSIS — N182 Chronic kidney disease, stage 2 (mild): Secondary | ICD-10-CM | POA: Diagnosis not present

## 2022-08-21 DIAGNOSIS — N052 Unspecified nephritic syndrome with diffuse membranous glomerulonephritis: Secondary | ICD-10-CM | POA: Diagnosis not present

## 2022-08-21 DIAGNOSIS — N022 Recurrent and persistent hematuria with diffuse membranous glomerulonephritis: Secondary | ICD-10-CM | POA: Diagnosis not present

## 2022-08-23 DIAGNOSIS — D500 Iron deficiency anemia secondary to blood loss (chronic): Secondary | ICD-10-CM | POA: Diagnosis not present

## 2022-08-23 DIAGNOSIS — N049 Nephrotic syndrome with unspecified morphologic changes: Secondary | ICD-10-CM | POA: Diagnosis not present

## 2022-08-23 DIAGNOSIS — M8589 Other specified disorders of bone density and structure, multiple sites: Secondary | ICD-10-CM | POA: Diagnosis not present

## 2022-08-23 DIAGNOSIS — Z0000 Encounter for general adult medical examination without abnormal findings: Secondary | ICD-10-CM | POA: Diagnosis not present

## 2022-08-23 DIAGNOSIS — A498 Other bacterial infections of unspecified site: Secondary | ICD-10-CM | POA: Diagnosis not present

## 2022-08-23 DIAGNOSIS — Z9889 Other specified postprocedural states: Secondary | ICD-10-CM | POA: Diagnosis not present

## 2022-08-24 DIAGNOSIS — K769 Liver disease, unspecified: Secondary | ICD-10-CM | POA: Diagnosis not present

## 2022-08-24 DIAGNOSIS — D137 Benign neoplasm of endocrine pancreas: Secondary | ICD-10-CM | POA: Diagnosis not present

## 2022-09-05 DIAGNOSIS — N059 Unspecified nephritic syndrome with unspecified morphologic changes: Secondary | ICD-10-CM | POA: Diagnosis not present

## 2022-09-05 DIAGNOSIS — D3A8 Other benign neuroendocrine tumors: Secondary | ICD-10-CM | POA: Diagnosis not present

## 2022-09-07 ENCOUNTER — Telehealth: Payer: Self-pay | Admitting: Gastroenterology

## 2022-09-07 NOTE — Telephone Encounter (Signed)
Patient's wife called stating patient was admitted to [facility] for 4 days due to bleeding. States they just return from [facility]. States the provider instructed for the patient to have a endoscopy and colonoscopy done as soon as possible to find out where the bleeding is coming from. Requesting a call back to discuss this further. Please advise, thank you.

## 2022-09-10 NOTE — Telephone Encounter (Signed)
[place], please see Dr. Frankey Shown notes. Pt needs an office visit with him or an APP. Direct EGD and colonoscopy in the [facility] for evaluation of iron deficiency anemia. Thanks

## 2022-09-10 NOTE — Telephone Encounter (Signed)
Called patient's wife to schedule procedure and OV. States she did not want to schedule patient's procedure prior to OV. Patient is scheduled for 9/10.

## 2022-09-18 DIAGNOSIS — M47812 Spondylosis without myelopathy or radiculopathy, cervical region: Secondary | ICD-10-CM | POA: Diagnosis not present

## 2022-09-18 DIAGNOSIS — M5412 Radiculopathy, cervical region: Secondary | ICD-10-CM | POA: Diagnosis not present

## 2022-09-18 DIAGNOSIS — M7918 Myalgia, other site: Secondary | ICD-10-CM | POA: Diagnosis not present

## 2022-09-21 ENCOUNTER — Other Ambulatory Visit (HOSPITAL_COMMUNITY): Payer: Self-pay | Admitting: Internal Medicine

## 2022-09-21 DIAGNOSIS — M5412 Radiculopathy, cervical region: Secondary | ICD-10-CM

## 2022-09-24 ENCOUNTER — Ambulatory Visit (HOSPITAL_COMMUNITY)
Admission: RE | Admit: 2022-09-24 | Discharge: 2022-09-24 | Disposition: A | Discharge status: Home / Self Care | Payer: Medicare Other | Source: Ambulatory Visit | Attending: Internal Medicine | Admitting: Internal Medicine

## 2022-09-24 DIAGNOSIS — M47812 Spondylosis without myelopathy or radiculopathy, cervical region: Secondary | ICD-10-CM | POA: Diagnosis not present

## 2022-09-24 DIAGNOSIS — M5412 Radiculopathy, cervical region: Secondary | ICD-10-CM | POA: Insufficient documentation

## 2022-10-08 DIAGNOSIS — E559 Vitamin D deficiency, unspecified: Secondary | ICD-10-CM | POA: Diagnosis not present

## 2022-10-08 DIAGNOSIS — K922 Gastrointestinal hemorrhage, unspecified: Secondary | ICD-10-CM | POA: Diagnosis not present

## 2022-10-08 DIAGNOSIS — D6489 Other specified anemias: Secondary | ICD-10-CM | POA: Diagnosis not present

## 2022-10-08 DIAGNOSIS — E782 Mixed hyperlipidemia: Secondary | ICD-10-CM | POA: Diagnosis not present

## 2022-10-08 DIAGNOSIS — Z87898 Personal history of other specified conditions: Secondary | ICD-10-CM | POA: Diagnosis not present

## 2022-10-08 DIAGNOSIS — D500 Iron deficiency anemia secondary to blood loss (chronic): Secondary | ICD-10-CM | POA: Diagnosis not present

## 2022-10-09 ENCOUNTER — Encounter: Payer: BLUE CROSS/BLUE SHIELD | Admitting: Gastroenterology

## 2022-10-10 ENCOUNTER — Ambulatory Visit (INDEPENDENT_AMBULATORY_CARE_PROVIDER_SITE_OTHER): Payer: Medicare Other | Admitting: Physician Assistant

## 2022-10-10 ENCOUNTER — Encounter: Payer: Self-pay | Admitting: Physician Assistant

## 2022-10-10 VITALS — BP 132/60 | HR 68 | Ht 73.0 in | Wt 195.0 lb

## 2022-10-10 DIAGNOSIS — Z86711 Personal history of pulmonary embolism: Secondary | ICD-10-CM | POA: Diagnosis not present

## 2022-10-10 DIAGNOSIS — Z9049 Acquired absence of other specified parts of digestive tract: Secondary | ICD-10-CM

## 2022-10-10 DIAGNOSIS — N055 Unspecified nephritic syndrome with diffuse mesangiocapillary glomerulonephritis: Secondary | ICD-10-CM

## 2022-10-10 DIAGNOSIS — Z90410 Acquired total absence of pancreas: Secondary | ICD-10-CM

## 2022-10-10 DIAGNOSIS — D509 Iron deficiency anemia, unspecified: Secondary | ICD-10-CM

## 2022-10-10 DIAGNOSIS — M069 Rheumatoid arthritis, unspecified: Secondary | ICD-10-CM

## 2022-10-10 LAB — LAB REPORT - SCANNED: EGFR: 60

## 2022-10-10 MED ORDER — NA SULFATE-K SULFATE-MG SULF 17.5-3.13-1.6 GM/177ML PO SOLN: 1.0000 | Freq: Once | ORAL | 0 refills | Status: DC

## 2022-10-10 MED ORDER — NA SULFATE-K SULFATE-MG SULF 17.5-3.13-1.6 GM/177ML PO SOLN: 1.0000 | Freq: Once | ORAL | 0 refills | Status: AC

## 2022-10-10 NOTE — Patient Instructions (Addendum)
You have been scheduled for a colonoscopy. Please follow written instructions given to you at your visit today.   Please pick up your prep supplies at the pharmacy within the next 1-3 days.  If you use inhalers (even only as needed), please bring them with you on the day of your procedure.  DO NOT TAKE 7 DAYS PRIOR TO TEST- Trulicity (dulaglutide) Ozempic, Wegovy (semaglutide) Mounjaro (tirzepatide) Bydureon Bcise (exanatide extended release)  DO NOT TAKE 1 DAY PRIOR TO YOUR TEST Rybelsus (semaglutide) Adlyxin (lixisenatide) Victoza (liraglutide) Byetta (exanatide) ___________________________________________________________________________   Please take your proton pump inhibitor medication, omepraozle  Please take this medication 30 minutes to 1 hour before meals- this makes it more effective.  Avoid spicy and acidic foods Avoid fatty foods Limit your intake of coffee, tea, alcohol, and carbonated drinks Work to maintain a healthy weight Keep the head of the bed elevated at least 3 inches with blocks or a wedge pillow if you are having any nighttime symptoms Stay upright for 2 hours after eating    B12 is mainly in meat, so increase meat can help this but often people have a deficiency that they are just not absorbing it well with meat or pills, so get the sublingual/melt in your mouth one.   If the sublingual one dose not increase your level, we will discuss shots.   Vitamin B12 Deficiency Vitamin B12 deficiency occurs when the body does not have enough vitamin B12, which is an important vitamin. The body needs this vitamin: To make red blood cells. To make DNA. This is the genetic material inside cells. To help the nerves work properly so they can carry messages from the brain to the body. Vitamin B12 deficiency can cause various health problems, such as a low red blood cell count (anemia) or nerve damage. What are the causes? This condition may be caused by: Not eating  enough foods that contain vitamin B12. Not having enough stomach acid and digestive fluids to properly absorb vitamin B12 from the food that you eat. Certain digestive system diseases that make it hard to absorb vitamin B12. These diseases include Crohn's disease, chronic pancreatitis, and cystic fibrosis. A condition in which the body does not make enough of a protein (intrinsic factor), resulting in too few red blood cells (pernicious anemia). Having a surgery in which part of the stomach or small intestine is removed. Taking certain medicines that make it hard for the body to absorb vitamin B12. These medicines include: Heartburn medicines (antacids and proton pump inhibitors). Certain antibiotic medicines. Some medicines that are used to treat diabetes, tuberculosis, gout, or high cholesterol. What increases the risk? The following factors may make you more likely to develop a B12 deficiency: Being older than age 29. Eating a vegetarian or vegan diet, especially while you are pregnant. Eating a poor diet while you are pregnant. Taking certain medicines. Having alcoholism. What are the signs or symptoms? In some cases, there are no symptoms of this condition. If the condition leads to anemia or nerve damage, various symptoms can occur, such as: Weakness. Fatigue. Loss of appetite. Weight loss. Numbness or tingling in your hands and feet. Redness and burning of the tongue. Confusion or memory problems. Depression. Sensory problems, such as color blindness, ringing in the ears, or loss of taste. Diarrhea or constipation. Trouble walking. If anemia is severe, symptoms can include: Shortness of breath. Dizziness. Rapid heart rate (tachycardia). How is this diagnosed? This condition may be diagnosed with a blood test  to measure the level of vitamin B12 in your blood. You may also have other tests, including: A group of tests that measure certain characteristics of blood cells  (complete blood count, CBC). A blood test to measure intrinsic factor. A procedure where a thin tube with a camera on the end is used to look into your stomach or intestines (endoscopy). Other tests may be needed to discover the cause of B12 deficiency. How is this treated? Treatment for this condition depends on the cause. This condition may be treated by: Changing your eating and drinking habits, such as: Eating more foods that contain vitamin B12. Drinking less alcohol or no alcohol. Getting vitamin B12 injections. Taking vitamin B12 supplements. Your health care provider will tell you which dosage is best for you. Follow these instructions at home: Eating and drinking  Eat lots of healthy foods that contain vitamin B12, including: Meats and poultry. This includes beef, pork, chicken, [place], and organ meats, such as liver. Seafood. This includes clams, rainbow trout, salmon, tuna, and haddock. Eggs. Cereal and dairy products that are fortified. This means that vitamin B12 has been added to the food. Check the label on the package to see if the food is fortified. The items listed above may not be a complete list of recommended foods and beverages. Contact a dietitian for more information. General instructions Get any injections that are prescribed by your health care provider. Take supplements only as told by your health care provider. Follow the directions carefully. Do not drink alcohol if your health care provider tells you not to. In some cases, you may only be asked to limit alcohol use. Keep all follow-up visits as told by your health care provider. This is important. Contact a health care provider if: Your symptoms come back. Get help right away if you: Develop shortness of breath. Have a rapid heart rate. Have chest pain. Become dizzy or lose consciousness. Summary Vitamin B12 deficiency occurs when the body does not have enough vitamin B12. The main causes of vitamin B12  deficiency include dietary deficiency, digestive diseases, pernicious anemia, and having a surgery in which part of the stomach or small intestine is removed. In some cases, there are no symptoms of this condition. If the condition leads to anemia or nerve damage, various symptoms can occur, such as weakness, shortness of breath, and numbness. Treatment may include getting vitamin B12 injections or taking vitamin B12 supplements. Eat lots of healthy foods that contain vitamin B12. This information is not intended to replace advice given to you by your health care provider. Make sure you discuss any questions you have with your health care provider. Document Revised: 09/12/2018 Document Reviewed: 12/03/2017 Elsevier Patient Education  2020 [employer].   _______________________________________________________  If your blood pressure at your visit was 140/90 or greater, please contact your primary care physician to follow up on this.  _______________________________________________________  If you are age 53 or older, your body mass index should be between 23-30. Your Body mass index is 25.73 kg/m. If this is out of the aforementioned range listed, please consider follow up with your Primary Care Provider.  If you are age 71 or younger, your body mass index should be between 19-25. Your Body mass index is 25.73 kg/m. If this is out of the aformentioned range listed, please consider follow up with your Primary Care Provider.   ________________________________________________________  The [facility] providers would like to encourage you to use [facility] to communicate with providers for non-urgent  requests or questions.  Due to long hold times on the telephone, sending your provider a message by [facility] may be a faster and more efficient way to get a response.  Please allow 48 business hours for a response.  Please remember that this is for non-urgent requests.   _______________________________________________________ It was a pleasure to see you today!  Thank you for trusting me with your gastrointestinal care!

## 2022-10-10 NOTE — Progress Notes (Signed)
10/10/2022 Stephen Gross [id number] Jan 08, 1946  Referring provider: Street, Stephanie Coup, * Primary GI doctor: Dr. Barron Alvine  ASSESSMENT AND PLAN:   Iron deficiency anemia, unspecified iron deficiency anemia type With B12 deficiency in setting of previous Whipple 2002 EGD and colon 2019 with Dr. Barron Alvine showed adequate bowel prep, tubular adenomatous polyps recall 01/2025, EGD 2 cm hiatal hernia gastrojejunostomy healthy-appearing mucosa gastritis Iron was 20, low saturations, normal ferritin. Patient is been on iron once daily since that time No significant GI symptoms but patient has been on prednisone 10 mg daily for her RA and does drink wine daily without any gastric protection -Add on omeprazole 20 mg once daily, stop EtOH -Will schedule again for EGD/colonoscopy to evaluate for IDA, can be done at [facility], will hold Xarelto I recommend upper gastrointestinal and colorectal evaluation with an EGD and colonoscopy.  Risk of bowel prep, conscious sedation, and EGD and colonoscopy were discussed.  Risks include but are not limited to dehydration, pain, bleeding, cardiopulmonary process, bowel perforation, or other possible adverse outcomes..  Treatment plan was discussed with patient, and agreed upon.  Recurrent E. coli bacteremia No definitive source Getting EGD/colonoscopy Thought potentially due to reflux cholangitis Can consider discussing with our biliary specialist/repeat EUS pending EGD/colonoscopy  Rheumatoid arthritis, involving unspecified site, unspecified whether rheumatoid factor present (HCC) with membranous proliferative nephritis On prednisone daily restarted since January, also restarted rituximab infusions  Patient immunosuppressed ?  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   History of pulmonary  embolism Hold Xarelto for 2 days before procedure  will instruct when and how to resume after procedure.  Patient understands that there is a low but real risk of cardiovascular event such as heart attack, stroke, or embolism /  thrombosis, or ischemia while off Coumadin.  The patient consents to proceed.  Will communicate by phone or EMR with patient's prescribing provider to confirm that holding Coumadin is reasonable in this case.     Patient Care Team: Street, Stephanie Coup, MD as PCP - General (Family Medicine) Karie Soda, MD as Consulting Physician (General Surgery)  HISTORY OF PRESENT ILLNESS: 77 y.o. male with a past medical history of insulinoma status post Whipple in 2002 with liver lesions, glomerulonephritis/PMR/RA on rituximab follows with oncology [facility] and [facility], unprovoked PE 2018 and 2019 on Xarelto, at and others listed below presents for evaluation of IDA.   2019 last saw by Dr. Barron Alvine 01/07/2018 colonoscopy adequate bowel prep 2 TA polyps 2 to 4 mm transverse, diverticulosis, nonbleeding internal hemorrhoids repeat 5 years 01/2025 EGD 2 cm hiatal hernia, Z-line regular, normal upper third of esophagus and middle third, gastro jejunostomy found healthy appearing mucosa, gastritis, negative H. Pylori  5/9 through 5/11 hospitalization at [facility] for E. coli bacteremia, started on B12 supplement, IV Zosyn in hospital and on ciprofloxacin through 5/16.   No clear source of bacteremia on CT abdomen pelvis, urine culture.  Patient also had mild anemia hemoglobin 11-12 about 2 years, macrocytosis with MCV 100.  Iron studies showed normal ferritin at 64 but low saturation 8%, low iron at 27 .B12 262 started on B12.  Normal folate. Chromogranin A level 135. CT abdomen pelvis unremarkable, CT a negative for PE.  05/17 MRI abdomen with and without showed no suspicious pulmonary abnormalities, Liver: Normal in morphology and signal.  Increased area  of regional  arterial hyperenhancement in the right inferior hepatic lobe, which fades  to background on portal venous phase and is likely perfusional. There is an unchanged subcentimeter focus of hyperenhancement on all postcontrast phases within this area (series 18, image 35). This is again favored to represent a vascular shunt or hemangioma. The portal and hepatic veins are patent. - Biliary and Gallbladder: No intrahepatic or extrahepatic bile duct dilatation. The gallbladder is surgically absent.  - Spleen: Normal in appearance.   - Pancreas: Postsurgical changes of Whipple. The remaining pancreas is  normal in appearance. No pancreatic ductal dilation.  - Gastrointestinal Tract: No abnormal dilation or wall thickening.  - Peritoneum/Mesentery/Retroperitoneum: No free fluid.  - Lymph Nodes: No retroperitoneal or mesenteric lymphadenopathy.  He presents today to discuss IDA.  Wife is here with him.  He is on iron once in the morning and tolerating it well. Taking two colace that is helping.  He is on B12 daily but on pills.  Had labs with PCP, Dr. Casper Harrison at [facility], will try to get labs.   Prior to his admission he was having chills, muscle aches, bile emesis, fever.  Blood cultures positive for E. coli. He can have constipation and diarrhea, alternates.  He has lower AB pain at times, comes and goes, can be months between episodes, can be very bothersome when it happens, last for a few days.  No GERD, nausea, vomiting. Rare dysphagia if he is eating too quickly.  He denies melena or hematochezia.  Patient's had previous E. coli bacteremia in the past thought to be due to reflux chalagnitis, since having Whipple 2002 will have episodic flu like symptoms that can last a day 3-4 times per year. States early on after his whipple, he would have 6 x a year, achy with muscle, no weakness or joint pain, fever around a 100, shaking, fever responds to tylenol, has associated fatigue. He was on  periodic prednisone, he is on it at this time, 10 mg daily. He was restarted beginning of this year.  He is still on rituximab infusions, he started on this previously 2018/2019, 3 total infusions, then in Jan of this year started again every 6 months for RA and kidney.  No gastric protection.   He reports blood thinner use.  He denies NSAID use.  He reports ETOH use, 1-2 cocktail a week and wine with dinner. Wine daily.  He denies tobacco use.  He denies drug use.    He  reports that he quit smoking about 52 years ago. His smoking use included cigarettes. He has never used smokeless tobacco. He reports current alcohol use. He reports that he does not use drugs.  RELEVANT LABS AND IMAGING: CBC    Component Value Date/Time   WBC 5.5 03/16/2019 1443   RBC 4.24 03/16/2019 1443   HGB 13.1 03/16/2019 1443   HCT 40.5 03/16/2019 1443   PLT 300 03/16/2019 1443   MCV 95.5 03/16/2019 1443   MCH 30.9 03/16/2019 1443   MCHC 32.3 03/16/2019 1443   RDW 12.9 03/16/2019 1443   LYMPHSABS 0.9 03/16/2019 1443   MONOABS 0.7 03/16/2019 1443   EOSABS 0.1 03/16/2019 1443   BASOSABS 0.0 03/16/2019 1443   No results for input(s): "HGB" in the last 8760 hours.  CMP     Component Value Date/Time   NA 141 03/16/2019 1443   K 4.6 03/16/2019 1443   CL 105 03/16/2019 1443   CO2 30 03/16/2019 1443   GLUCOSE  96 03/16/2019 1443   BUN 15 03/16/2019 1443   CREATININE 0.79 03/16/2019 1443   CALCIUM 8.7 (L) 03/16/2019 1443   PROT 4.7 (L) 03/16/2019 1443   ALBUMIN 2.1 (L) 03/16/2019 1443   AST 28 03/16/2019 1443   ALT 28 03/16/2019 1443   ALKPHOS 49 03/16/2019 1443   BILITOT 0.7 03/16/2019 1443   GFRNONAA >60 03/16/2019 1443   GFRAA >60 03/16/2019 1443      Latest Ref Rng & Units 03/16/2019    2:43 PM  Hepatic Function  Total Protein 6.5 - 8.1 g/dL 4.7   Albumin 3.5 - 5.0 g/dL 2.1   AST 15 - 41 U/L 28   ALT 0 - 44 U/L 28   Alk Phosphatase 38 - 126 U/L 49   Total Bilirubin 0.3 - 1.2 mg/dL 0.7        Current Medications:   Current Outpatient Medications (Endocrine & Metabolic):    predniSONE (DELTASONE) 10 MG tablet, Take 10 mg by mouth daily with breakfast.  Current Outpatient Medications (Cardiovascular):    amLODipine (NORVASC) 5 MG tablet, Take 5 mg by mouth daily.   atorvastatin (LIPITOR) 40 MG tablet, Take 40 mg by mouth at bedtime.    furosemide (LASIX) 20 MG tablet, Take 20 mg by mouth daily.   losartan (COZAAR) 100 MG tablet, Take 100 mg by mouth daily.   Current Outpatient Medications (Analgesics):    acetaminophen (TYLENOL) 650 MG CR tablet, Take 1,300 mg by mouth in the morning and at bedtime.  Current Outpatient Medications (Hematological):    cyanocobalamin (VITAMIN B12) 1000 MCG/ML injection, Inject 1,000 mcg into the muscle every 30 (thirty) days.   ferrous sulfate 325 (65 FE) MG tablet, Take 325 mg by mouth daily with breakfast.   XARELTO 20 MG TABS tablet, Take 20 mg by mouth daily.  Current Outpatient Medications (Other):    Calcium Carb-Cholecalciferol (CALCIUM 600 + D PO), Take 1 capsule by mouth daily.   cyclobenzaprine (FLEXERIL) 5 MG tablet, Take 1 tablet (5 mg total) by mouth 2 (two) times daily as needed for muscle spasms.   docusate sodium (COLACE) 100 MG capsule, Take 200 mg by mouth at bedtime.   finasteride (PROSCAR) 5 MG tablet, Take 5 mg by mouth daily.   gabapentin (NEURONTIN) 300 MG capsule, Take 300 mg by mouth at bedtime.   hydroxychloroquine (PLAQUENIL) 200 MG tablet, Take 400 mg by mouth daily.   lidocaine (LIDODERM) 5 %, Place 1 patch onto the skin daily. Remove & Discard patch within 12 hours or as directed by MD   Melatonin 5 MG CAPS, Take 1 capsule by mouth daily.   zinc gluconate 50 MG tablet, Take 50 mg by mouth daily.   Na Sulfate-K Sulfate-Mg Sulf 17.5-3.13-1.6 GM/177ML SOLN, Take 1 kit by mouth once for 1 dose.  Medical History:  Past Medical History:  Diagnosis Date   ANA positive    Arthritis    Blood clot in vein     in lung; unprevoked pulmonary embolisms in  2018 and 04/07/2018 managed on xarelto    Cancer [facility])    insulinoma   Diverticulosis    DVT (deep venous thrombosis) (HCC)    E coli bacteremia    Hypertension    Insulinoma    Membranous glomerulonephritis    Pancreatitis    Pulmonary embolism (HCC)    Pure hypercholesterolemia    Thyroid nodule    Allergies:  Allergies  Allergen Reactions   Lisinopril Cough   Nsaids  Bleeding internally     Surgical History:  He  has a past surgical history that includes Pancreaticoduodenectomy (2002); Renal biopsy; Carpal tunnel release (Bilateral); Tonsillectomy; Cholecystectomy; Inguinal hernia repair (Left, 03/19/2019); and Whipple procedure. Family History:  His family history includes Heart disease in his father and mother; Lung cancer in his paternal uncle.  REVIEW OF SYSTEMS  : All other systems reviewed and negative except where noted in the History of Present Illness.  PHYSICAL EXAM: BP 132/60   Pulse 68   Ht 6\' 1"  (1.854 m)   Wt 195 lb (88.5 kg)   BMI 25.73 kg/m  General Appearance: Well nourished, in no apparent distress. Head:   Normocephalic and atraumatic. Eyes:  sclerae anicteric,conjunctive pink  Respiratory: Respiratory effort normal, BS equal bilaterally without rales, rhonchi, wheezing. Cardio: RRR with no MRGs. Peripheral pulses intact.  Abdomen: Soft,  Non-distended ,active bowel sounds. mild tenderness in the epigastrium. Without guarding and Without rebound. No masses. Rectal: Not evaluated Musculoskeletal: Full ROM, Normal gait. Without edema. Skin:  Dry and intact without significant lesions or rashes Neuro: Alert and  oriented x4;  No focal deficits. Psych:  Cooperative. Normal mood and affect.    Doree Albee, PA-C 11:43 AM

## 2022-10-12 ENCOUNTER — Telehealth: Payer: Self-pay

## 2022-10-12 NOTE — Progress Notes (Signed)
Agree with the assessment and plan as outlined by Amanda Collier, PA-C. ? ?Leslee Haueter, DO, FACG ? ?

## 2022-10-12 NOTE — Telephone Encounter (Signed)
Called patient and left voicemail regarding fax received stating we cannot sending prescriptions to the [place] pharmacy and to please give me a call back

## 2022-10-15 DIAGNOSIS — U071 COVID-19: Secondary | ICD-10-CM | POA: Diagnosis not present

## 2022-10-15 DIAGNOSIS — Z6824 Body mass index (BMI) 24.0-24.9, adult: Secondary | ICD-10-CM | POA: Diagnosis not present

## 2022-10-15 DIAGNOSIS — R509 Fever, unspecified: Secondary | ICD-10-CM | POA: Diagnosis not present

## 2022-11-02 DIAGNOSIS — N052 Unspecified nephritic syndrome with diffuse membranous glomerulonephritis: Secondary | ICD-10-CM | POA: Diagnosis not present

## 2022-11-19 DIAGNOSIS — N052 Unspecified nephritic syndrome with diffuse membranous glomerulonephritis: Secondary | ICD-10-CM | POA: Diagnosis not present

## 2022-11-20 ENCOUNTER — Telehealth: Payer: Self-pay | Admitting: Gastroenterology

## 2022-11-21 ENCOUNTER — Telehealth: Payer: Self-pay

## 2022-11-21 NOTE — Telephone Encounter (Signed)
Received clearance from Dr street today to hold patients xarelto for 3 days, dr street also stated its up to our discretion if wee should get patient on bridge + Lovenox- procedure date 11-28-2022. Please advise

## 2022-11-21 NOTE — Telephone Encounter (Signed)
error 

## 2022-11-22 ENCOUNTER — Telehealth: Payer: Self-pay

## 2022-11-22 NOTE — Telephone Encounter (Signed)
Called patient to let him know of his xarleto 3 day hold with lovenox bridge, patient verbalized understanding.

## 2022-11-28 ENCOUNTER — Encounter: Payer: BLUE CROSS/BLUE SHIELD | Admitting: Gastroenterology

## 2022-11-28 ENCOUNTER — Other Ambulatory Visit: Payer: Self-pay | Admitting: Gastroenterology

## 2022-11-28 ENCOUNTER — Encounter: Payer: Self-pay | Admitting: Gastroenterology

## 2022-11-28 ENCOUNTER — Ambulatory Visit: Payer: Medicare Other | Admitting: Gastroenterology

## 2022-11-28 VITALS — BP 111/61 | HR 63 | Temp 96.8°F | Resp 11 | Ht 73.0 in | Wt 195.0 lb

## 2022-11-28 DIAGNOSIS — Z9049 Acquired absence of other specified parts of digestive tract: Secondary | ICD-10-CM

## 2022-11-28 DIAGNOSIS — K2970 Gastritis, unspecified, without bleeding: Secondary | ICD-10-CM

## 2022-11-28 DIAGNOSIS — K641 Second degree hemorrhoids: Secondary | ICD-10-CM | POA: Diagnosis not present

## 2022-11-28 DIAGNOSIS — Z09 Encounter for follow-up examination after completed treatment for conditions other than malignant neoplasm: Secondary | ICD-10-CM | POA: Diagnosis not present

## 2022-11-28 DIAGNOSIS — C180 Malignant neoplasm of cecum: Secondary | ICD-10-CM | POA: Diagnosis not present

## 2022-11-28 DIAGNOSIS — D124 Benign neoplasm of descending colon: Secondary | ICD-10-CM | POA: Diagnosis not present

## 2022-11-28 DIAGNOSIS — Z86010 Personal history of colonic polyps: Secondary | ICD-10-CM | POA: Diagnosis not present

## 2022-11-28 DIAGNOSIS — Z90410 Acquired total absence of pancreas: Secondary | ICD-10-CM | POA: Diagnosis not present

## 2022-11-28 DIAGNOSIS — D120 Benign neoplasm of cecum: Secondary | ICD-10-CM

## 2022-11-28 DIAGNOSIS — K635 Polyp of colon: Secondary | ICD-10-CM | POA: Diagnosis not present

## 2022-11-28 DIAGNOSIS — C186 Malignant neoplasm of descending colon: Secondary | ICD-10-CM | POA: Diagnosis not present

## 2022-11-28 DIAGNOSIS — K5730 Diverticulosis of large intestine without perforation or abscess without bleeding: Secondary | ICD-10-CM | POA: Diagnosis not present

## 2022-11-28 DIAGNOSIS — D509 Iron deficiency anemia, unspecified: Secondary | ICD-10-CM

## 2022-11-28 MED ORDER — PANTOPRAZOLE SODIUM 40 MG PO TBEC: 40.0000 mg | DELAYED_RELEASE_TABLET | Freq: Two times a day (BID) | ORAL | 0 refills | Status: DC

## 2022-11-28 MED ORDER — SODIUM CHLORIDE 0.9 % IV SOLN: 500.0000 mL | INTRAVENOUS | Status: DC

## 2022-11-28 NOTE — Progress Notes (Signed)
GASTROENTEROLOGY PROCEDURE H&P NOTE   Primary Care Physician: Street, Stephanie Coup, MD    Reason for Procedure:   Iron def anemia, B12 deficiency, polyp surveillance, recent E coli bacteremia  Plan:    EGD, colonoscopy  Patient is appropriate for endoscopic procedure(s) in the ambulatory ([facility]) setting.  The nature of the procedure, as well as the risks, benefits, and alternatives were carefully and thoroughly reviewed with the patient. Ample time for discussion and questions allowed. The patient understood, was satisfied, and agreed to proceed.     HPI: Stephen Gross is a 77 y.o. male who presents for EGD and colonsocopy for evaluation of IDA, B12 def, recurrent E coli bacteremia, and for routine polyp surveillance.   Has been holding Xarelto and bridged with Lovenox, with last dose of Lovenox this AM.   Past Medical History:  Diagnosis Date   ANA positive    Arthritis    Blood clot in vein    in lung; unprevoked pulmonary embolisms in  2018 and 04/07/2018 managed on xarelto    Cancer [facility])    insulinoma   Diverticulosis    DVT (deep venous thrombosis) (HCC)    E coli bacteremia    Hypertension    Insulinoma    Membranous glomerulonephritis    Pancreatitis    Pulmonary embolism (HCC)    Pure hypercholesterolemia    Thyroid nodule     Past Surgical History:  Procedure Laterality Date   CARPAL TUNNEL RELEASE Bilateral    CHOLECYSTECTOMY     INGUINAL HERNIA REPAIR Left 03/19/2019   Procedure: OPEN REPAIR OF LEFT INGUINAL HERNIA WITH MESH;  Surgeon: Gaynelle Adu, MD;  Location: [facility];  Service: General;  Laterality: Left;   PANCREATICODUODENECTOMY  2002   RENAL BIOPSY     TONSILLECTOMY     WHIPPLE PROCEDURE      Prior to Admission medications   Medication Sig Start Date End Date Taking? Authorizing Provider  acetaminophen (TYLENOL) 650 MG CR tablet Take 1,300 mg by mouth in the morning and at bedtime.   Yes [provider]  amLODipine (NORVASC) 5  MG tablet Take 5 mg by mouth daily.   Yes [provider]  atorvastatin (LIPITOR) 40 MG tablet Take 40 mg by mouth at bedtime.  12/06/17  Yes [provider]  Calcium Carb-Cholecalciferol (CALCIUM 600 + D PO) Take 1 capsule by mouth daily.   Yes [provider]  Cyanocobalamin (B-12) 1000 MCG TABS Take 1 tablet by mouth daily.   Yes [provider]  docusate sodium (COLACE) 100 MG capsule Take 200 mg by mouth at bedtime.   Yes [provider]  enoxaparin (LOVENOX) 80 MG/0.8ML injection Inject 80 mg into the skin every 12 (twelve) hours. 11/21/22  Yes [provider]  ferrous sulfate 325 (65 FE) MG tablet Take 325 mg by mouth daily with breakfast.   Yes [provider]  finasteride (PROSCAR) 5 MG tablet Take 5 mg by mouth daily.   Yes [provider]  hydroxychloroquine (PLAQUENIL) 200 MG tablet Take 400 mg by mouth daily.   Yes [provider]  losartan (COZAAR) 100 MG tablet Take 100 mg by mouth daily.   Yes [provider]  Melatonin 5 MG CAPS Take 1 capsule by mouth daily.   Yes [provider]  Na Sulfate-K Sulfate-Mg Sulf 17.5-3.13-1.6 GM/177ML SOLN SMARTSIG:1 Kit(s) By Mouth Once 10/10/22  Yes [provider]  predniSONE (DELTASONE) 10 MG tablet Take 10 mg by mouth  daily with breakfast.   Yes [provider]  zinc gluconate 50 MG tablet Take 50 mg by mouth daily.   Yes [provider]  cyclobenzaprine (FLEXERIL) 5 MG tablet Take 1 tablet (5 mg total) by mouth 2 (two) times daily as needed for muscle spasms. 05/25/22   Renne Crigler, PA-C  furosemide (LASIX) 20 MG tablet Take 20 mg by mouth daily.    [provider]  gabapentin (NEURONTIN) 300 MG capsule Take 300 mg by mouth at bedtime.    [provider]  lidocaine (LIDODERM) 5 % Place 1 patch onto the skin daily. Remove & Discard patch within 12 hours or as directed by MD 05/25/22   Renne Crigler, PA-C   ondansetron (ZOFRAN-ODT) 4 MG disintegrating tablet Take 4 mg by mouth every 8 (eight) hours as needed. Patient not taking: Reported on 11/28/2022 08/15/22   [provider]  tamsulosin (FLOMAX) 0.4 MG CAPS capsule Take by mouth. Patient not taking: Reported on 11/28/2022 02/29/20   [provider]  XARELTO 20 MG TABS tablet Take 20 mg by mouth daily. 02/20/19   [provider]    Current Outpatient Medications  Medication Sig Dispense Refill   acetaminophen (TYLENOL) 650 MG CR tablet Take 1,300 mg by mouth in the morning and at bedtime.     amLODipine (NORVASC) 5 MG tablet Take 5 mg by mouth daily.     atorvastatin (LIPITOR) 40 MG tablet Take 40 mg by mouth at bedtime.   11   Calcium Carb-Cholecalciferol (CALCIUM 600 + D PO) Take 1 capsule by mouth daily.     Cyanocobalamin (B-12) 1000 MCG TABS Take 1 tablet by mouth daily.     docusate sodium (COLACE) 100 MG capsule Take 200 mg by mouth at bedtime.     enoxaparin (LOVENOX) 80 MG/0.8ML injection Inject 80 mg into the skin every 12 (twelve) hours.     ferrous sulfate 325 (65 FE) MG tablet Take 325 mg by mouth daily with breakfast.     finasteride (PROSCAR) 5 MG tablet Take 5 mg by mouth daily.     hydroxychloroquine (PLAQUENIL) 200 MG tablet Take 400 mg by mouth daily.     losartan (COZAAR) 100 MG tablet Take 100 mg by mouth daily.     Melatonin 5 MG CAPS Take 1 capsule by mouth daily.     Na Sulfate-K Sulfate-Mg Sulf 17.5-3.13-1.6 GM/177ML SOLN SMARTSIG:1 Kit(s) By Mouth Once     predniSONE (DELTASONE) 10 MG tablet Take 10 mg by mouth daily with breakfast.     zinc gluconate 50 MG tablet Take 50 mg by mouth daily.     cyclobenzaprine (FLEXERIL) 5 MG tablet Take 1 tablet (5 mg total) by mouth 2 (two) times daily as needed for muscle spasms. 14 tablet 0   furosemide (LASIX) 20 MG tablet Take 20 mg by mouth daily.     gabapentin (NEURONTIN) 300 MG capsule Take 300 mg by mouth at bedtime.     lidocaine (LIDODERM) 5 %  Place 1 patch onto the skin daily. Remove & Discard patch within 12 hours or as directed by MD 15 patch 0   ondansetron (ZOFRAN-ODT) 4 MG disintegrating tablet Take 4 mg by mouth every 8 (eight) hours as needed. (Patient not taking: Reported on 11/28/2022)     tamsulosin (FLOMAX) 0.4 MG CAPS capsule Take by mouth. (Patient not taking: Reported on 11/28/2022)     XARELTO 20 MG TABS tablet Take 20 mg by mouth daily.  Current Facility-Administered Medications  Medication Dose Route Frequency Provider Last Rate Last Admin   0.9 %  sodium chloride infusion  500 mL Intravenous Continuous Kuron Docken V, DO        Allergies as of 11/28/2022 - Review Complete 11/28/2022  Allergen Reaction Noted   Lisinopril Cough 03/11/2019   Nsaids  11/25/2017    Family History  Problem Relation Age of Onset   Heart disease Mother    Heart disease Father    Lung cancer Paternal Uncle    Colon cancer Neg Hx    Esophageal cancer Neg Hx     Social History   Socioeconomic History   Marital status: Married    Spouse name: Not on file   Number of children: 2   Years of education: Not on file   Highest education level: Not on file  Occupational History   Occupation: z=Retired  Tobacco Use   Smoking status: Former    Current packs/day: 0.00    Types: Cigarettes    Quit date: 1972    Years since quitting: 52.6   Smokeless tobacco: Never  Vaping Use   Vaping status: Never Used  Substance and Sexual Activity   Alcohol use: Yes    Comment: Occ   Drug use: Never   Sexual activity: Yes  Other Topics Concern   Not on file  Social History Narrative   Not on file   Social Determinants of Health   Financial Resource Strain: Low Risk  (09/17/2022)   Received from [facility], [facility]   Overall Financial Resource Strain (CARDIA)    Difficulty of Paying Living Expenses: Not hard at all  Food Insecurity: No Food Insecurity (09/17/2022)   Received from [facility], [facility]   Hunger Vital Sign    Worried About Running Out of Food in the Last Year: Never true    Ran Out of Food in the Last Year: Never true  Transportation Needs: No Transportation Needs (09/17/2022)   Received from [facility], [facility]   [facility] - Transportation    In the past 12 months, has lack of transportation kept you from medical appointments or from getting medications?: No    Lack of Transportation (Non-Medical): No  Physical Activity: Not on file  Stress: Not on file  Social Connections: Not on file  Intimate Partner Violence: Not on file    Physical Exam: Vital signs in last 24 hours: @BP  (!) 141/75   Pulse 63   Temp (!) 96.8 F (36 C) (Temporal)   Resp 11   Ht 6\' 1"  (1.854 m)   Wt 195 lb (88.5 kg)   SpO2 95%   BMI 25.73 kg/m  GEN: NAD EYE: Sclerae anicteric ENT: MMM CV: Non-tachycardic Pulm: CTA b/l GI: Soft, NT/ND NEURO:  Alert & Oriented x 3   Doristine Locks, DO [facility]   11/28/2022 2:27 PM

## 2022-11-28 NOTE — Op Note (Signed)
[name] Patient Name: Stephen Gross Procedure Date: 11/28/2022 2:25 PM MRN: [id number] Endoscopist: Doristine Locks , MD, [id number] Age: 77 Referring MD:  Date of Birth: 1946-02-16 Gender: Male Account #: [id number] Procedure:                Upper GI endoscopy Indications:              Iron deficiency anemia, B12 deficiency, History of                            recurrent E coli bacetermia Medicines:                Monitored Anesthesia Care Procedure:                Pre-Anesthesia Assessment:                           - Prior to the procedure, a History and Physical                            was performed, and patient medications and                            allergies were reviewed. The patient's tolerance of                            previous anesthesia was also reviewed. The risks                            and benefits of the procedure and the sedation                            options and risks were discussed with the patient.                            All questions were answered, and informed consent                            was obtained. Prior Anticoagulants: The patient has                            taken Xarelto (last dose 4 days ago) and Lovenox                            (enoxaparin), last dose was day of procedure. ASA                            Grade Assessment: III - A patient with severe                            systemic disease. After reviewing the risks and                            benefits, the patient was deemed in satisfactory  condition to undergo the procedure.                           After obtaining informed consent, the endoscope was                            passed under direct vision. Throughout the                            procedure, the patient's blood pressure, pulse, and                            oxygen saturations were monitored continuously. The                            Olympus scope [id number]  was introduced through the                            mouth, and advanced to the jejunum. The upper GI                            endoscopy was accomplished without difficulty. The                            patient tolerated the procedure well. Scope In: Scope Out: Findings:                 The examined esophagus was normal.                           Evidence of a classic Whipple was found in the                            gastric body. This was characterized by healthy                            appearing mucosa at the anastamosis.                           Diffuse moderate inflammation characterized by                            congestion (edema) and erythema was found in the                            gastric fundus and in the gastric body. Biopsies                            were taken with a cold forceps for histology and                            Helicobacter pylori testing. Estimated blood loss                            was minimal.  The examined jejunum was normal. This was biopsied                            with a cold forceps for histology. Estimated blood                            loss was minimal. Complications:            No immediate complications. Estimated Blood Loss:     Estimated blood loss was minimal. Impression:               - Normal esophagus.                           - A classic Whipple was found, characterized by                            healthy appearing mucosa.                           - Gastritis. Biopsied.                           - Normal examined jejunum. Biopsied. Recommendation:           - Patient has a contact number available for                            emergencies. The signs and symptoms of potential                            delayed complications were discussed with the                            patient. Return to normal activities tomorrow.                            Written discharge instructions were  provided to the                            patient.                           - Resume previous diet.                           - Continue present medications.                           - Await pathology results.                           - Use Protonix (pantoprazole) 40 mg PO BID for 4                            weeks to promote mucosal healing of gastritis, then  reduce to 40 mg daily.                           - Colonoscopy today. Doristine Locks, MD 11/28/2022 3:22:43 PM

## 2022-11-28 NOTE — Op Note (Signed)
[facility] Patient Name: Stephen Gross Procedure Date: 11/28/2022 2:25 PM MRN: [id number] Endoscopist: Doristine Locks , MD, [id number] Age: 77 Referring MD:  Date of Birth: 1946/02/09 Gender: Male Account #: [id number] Procedure:                Colonoscopy Indications:              Surveillance: Personal history of adenomatous                            polyps on last colonoscopy 5 years ago                           01/07/2018 colonoscopy adequate bowel prep 2 TA                            polyps 2 to 4 mm transverse, diverticulosis,                            nonbleeding internal hemorrhoids                           Separately, history of iron deficiency anemia. Medicines:                Monitored Anesthesia Care Procedure:                Pre-Anesthesia Assessment:                           - Prior to the procedure, a History and Physical                            was performed, and patient medications and                            allergies were reviewed. The patient's tolerance of                            previous anesthesia was also reviewed. The risks                            and benefits of the procedure and the sedation                            options and risks were discussed with the patient.                            All questions were answered, and informed consent                            was obtained. Prior Anticoagulants: The patient has                            taken Lovenox (enoxaparin), last dose was day of  procedure. ASA Grade Assessment: III - A patient                            with severe systemic disease. After reviewing the                            risks and benefits, the patient was deemed in                            satisfactory condition to undergo the procedure.                           After obtaining informed consent, the colonoscope                            was passed under direct vision.  Throughout the                            procedure, the patient's blood pressure, pulse, and                            oxygen saturations were monitored continuously. The                            Olympus CF-HQ190L ([id number]) Colonoscope was                            introduced through the anus and advanced to the the                            cecum, identified by appendiceal orifice and                            ileocecal valve. The colonoscopy was performed                            without difficulty. The patient tolerated the                            procedure well. The quality of the bowel                            preparation was good. The ileocecal valve,                            appendiceal orifice, and rectum were photographed. Scope In: 2:47:12 PM Scope Out: 3:13:34 PM Scope Withdrawal Time: 0 hours 21 minutes 38 seconds  Total Procedure Duration: 0 hours 26 minutes 22 seconds  Findings:                 The perianal and digital rectal examinations were                            normal.  A 3 mm polyp was found in the cecum. The polyp was                            sessile. The polyp was removed with a cold snare.                            Resection and retrieval were complete. Estimated                            blood loss was minimal.                           An 8 mm polyp was found in the cecum. The polyp was                            mucous-capped and sessile. The polyp was removed                            with a cold snare. Resection and retrieval were                            complete. Estimated blood loss was minimal.                           Two sessile polyps were found in the descending                            colon. The polyps were 5 and 20 mm in size.                            Initially noted the 5 mm polyp, which was resected                            with a cold snare. Immmediately distal to this                             polyp was a large 20 mm polyp. This was semisessile                            and spread over a haustral fold with an area of                            angulation. Based on location, endoscopic                            appearance, and having had Lovenox earlier today,                            this was not endoscopically resectable today.                            Instead, the area on the  opposite wall immediately                            proximal to the larger polyp was tattooed with an                            injection of 1 mL of Spot (carbon black).                           Multiple large-mouthed and small-mouthed                            diverticula were found in the sigmoid colon and                            transverse colon.                           Non-bleeding internal hemorrhoids were found during                            retroflexion. The hemorrhoids were small. Complications:            No immediate complications. Estimated Blood Loss:     Estimated blood loss was minimal. Impression:               - One 3 mm polyp in the cecum, removed with a cold                            snare. Resected and retrieved.                           - One 8 mm polyp in the cecum, removed with a cold                            snare. Resected and retrieved.                           - Two 5 to 20 mm polyps in the descending colon,                            removed with a cold snare. Resected and retrieved.                            Tattooed.                           - Diverticulosis in the sigmoid colon and in the                            transverse colon.                           - Non-bleeding internal hemorrhoids. Recommendation:           - Patient has a contact number available for  emergencies. The signs and symptoms of potential                            delayed complications were discussed with the                             patient. Return to normal activities tomorrow.                            Written discharge instructions were provided to the                            patient.                           - Resume previous diet.                           - Continue present medications.                           - Await pathology results.                           - Resume Xarelto (rivaroxaban) at prior dose                            tomorrow. Do not need to resume the Lovenox.                           - Referral to [facility] for evaluation and                            consideration of endoscopic resection of the                            larger, laterally spreading descending colon polyp. Doristine Locks, MD 11/28/2022 3:31:31 PM

## 2022-11-28 NOTE — Patient Instructions (Addendum)
- Resume previous diet. - Continue present medications. - Await pathology results. - Use Protonix (pantoprazole) 40 mg PO BID for 4 weeks to promote mucosal healing of gastritis, then reduce to 40 mg daily. - Resume Xarelto (rivaroxaban) at prior dose tomorrow. Do not need to resume the Lovenox. - Referral to [facility] advance GI for evaluation and consideration of endoscopic resection of the larger, laterally spreading descending colon polyp.  YOU HAD AN ENDOSCOPIC PROCEDURE TODAY AT THE [facility]:   Refer to the procedure report that was given to you for any specific questions about what was found during the examination.  If the procedure report does not answer your questions, please call your gastroenterologist to clarify.  If you requested that your care partner not be given the details of your procedure findings, then the procedure report has been included in a sealed envelope for you to review at your convenience later.  YOU SHOULD EXPECT: Some feelings of bloating in the abdomen. Passage of more gas than usual.  Walking can help get rid of the air that was put into your GI tract during the procedure and reduce the bloating. If you had a lower endoscopy (such as a colonoscopy or flexible sigmoidoscopy) you may notice spotting of blood in your stool or on the toilet paper. If you underwent a bowel prep for your procedure, you may not have a normal bowel movement for a few days.  Please Note:  You might notice some irritation and congestion in your nose or some drainage.  This is from the oxygen used during your procedure.  There is no need for concern and it should clear up in a day or so.  SYMPTOMS TO REPORT IMMEDIATELY:  Following lower endoscopy (colonoscopy or flexible sigmoidoscopy):  Excessive amounts of blood in the stool  Significant tenderness or worsening of abdominal pains  Swelling of the abdomen that is new, acute  Fever of 100F or higher  Following upper endoscopy  (EGD)  Vomiting of blood or coffee ground material  New chest pain or pain under the shoulder blades  Painful or persistently difficult swallowing  New shortness of breath  Fever of 100F or higher  Black, tarry-looking stools  For urgent or emergent issues, a gastroenterologist can be reached at any hour by calling [phone]. Do not use MyChart messaging for urgent concerns.    DIET:  We do recommend a small meal at first, but then you may proceed to your regular diet.  Drink plenty of fluids but you should avoid alcoholic beverages for 24 hours.  ACTIVITY:  You should plan to take it easy for the rest of today and you should NOT DRIVE or use heavy machinery until tomorrow (because of the sedation medicines used during the test).    FOLLOW UP: Our staff will call the number listed on your records the next business day following your procedure.  We will call around 7:15- 8:00 am to check on you and address any questions or concerns that you may have regarding the information given to you following your procedure. If we do not reach you, we will leave a message.     If any biopsies were taken you will be contacted by phone or by letter within the next 1-3 weeks.  Please call us at [phone] if you have not heard about the biopsies in 3 weeks.    SIGNATURES/CONFIDENTIALITY: You and/or your care partner have signed paperwork which will be entered into your electronic  medical record.  These signatures attest to the fact that that the information above on your After Visit Summary has been reviewed and is understood.  Full responsibility of the confidentiality of this discharge information lies with you and/or your care-partner.

## 2022-11-28 NOTE — Progress Notes (Signed)
Pt's states no medical or surgical changes since previsit or office visit. 

## 2022-11-28 NOTE — Progress Notes (Signed)
Uneventful anesthetic. Report to pacu rn. Vss. Care resumed by rn. 

## 2022-11-28 NOTE — Progress Notes (Signed)
Called to room to assist during endoscopic procedure.  Patient ID and intended procedure confirmed with present staff. Received instructions for my participation in the procedure from the performing physician.  

## 2022-11-29 ENCOUNTER — Telehealth: Payer: Self-pay | Admitting: *Deleted

## 2022-11-29 NOTE — Telephone Encounter (Signed)
-----   Message from Shellia Cleverly sent at 11/28/2022  4:50 PM EDT ----- Can you please coordinate the following for this patient:  - follow-up with me in about 3 months - referral to Advanced GI at [facility] for endoscopic mucosal resection of a large, laterally spreading polyp in the descending colon.   Thanks!

## 2022-11-29 NOTE — Telephone Encounter (Signed)
  Follow up Call-     11/28/2022    1:19 PM  Call back number  Post procedure Call Back phone  # [phone]  Permission to leave phone message Yes     Patient questions:  Do you have a fever, pain , or abdominal swelling? No. Pain Score  0 *  Have you tolerated food without any problems? Yes.    Have you been able to return to your normal activities? Yes.    Do you have any questions about your discharge instructions: Diet   No. Medications  No. Follow up visit  No.  Do you have questions or concerns about your Care? No.  Actions: * If pain score is 4 or above: No action needed, pain <4.

## 2022-11-30 ENCOUNTER — Telehealth: Payer: Self-pay

## 2022-11-30 DIAGNOSIS — M0600 Rheumatoid arthritis without rheumatoid factor, unspecified site: Secondary | ICD-10-CM | POA: Diagnosis not present

## 2022-11-30 DIAGNOSIS — R801 Persistent proteinuria, unspecified: Secondary | ICD-10-CM | POA: Diagnosis not present

## 2022-11-30 DIAGNOSIS — Z7952 Long term (current) use of systemic steroids: Secondary | ICD-10-CM | POA: Diagnosis not present

## 2022-11-30 NOTE — Telephone Encounter (Signed)
Referral to [facility] is faxed per colonoscopy report outlined by Dr. Barron Alvine. Patient made aware. Fax: [phone] Ph: [phone]

## 2022-11-30 NOTE — Telephone Encounter (Signed)
PT returning call. Please advise.

## 2022-11-30 NOTE — Telephone Encounter (Signed)
Patient is advised of appointment with Dr Barron Alvine on 03/14/23 and is also advised that a referral has been placed to [facility] for further evaluation/treatment of his large colon polyp. Patient verbalizes understanding.

## 2022-11-30 NOTE — Telephone Encounter (Signed)
Patient is scheduled for a follow up appointment with Dr Barron Alvine on 03/14/23 at 920 am.   Referral with 25 pages of notes faxed to [facility] clinic for large spreading polyp of descending colon.  I have left a message for patient to call back.

## 2022-12-04 ENCOUNTER — Telehealth: Payer: Self-pay | Admitting: Gastroenterology

## 2022-12-04 ENCOUNTER — Encounter: Payer: Self-pay | Admitting: *Deleted

## 2022-12-04 DIAGNOSIS — C189 Malignant neoplasm of colon, unspecified: Secondary | ICD-10-CM

## 2022-12-04 NOTE — Telephone Encounter (Signed)
I received the following message regarding this patient's pathology results from the Pathologist.  "The biopsy of this patient's descending colon polyp x2 demonstrates at least intramucosal adenocarcinoma with area suspicious for submucosal invasion with background fragments of tubular adenoma with focal high-grade dysplasia."  I called Stephen Gross today to review these results by phone.  The larger, endoscopically unresectable lesion in the descending colon which was biopsied is surprisingly showing intramucosal adenocarcinoma with submucosal invasion.   Discussed the biopsy results and endoscopic findings at length today.  All questions answered to the best my ability and he was appreciative of the phone call.  Will plan for the following:  - Referral to Colorectal Surgery at [facility] for expedited evaluation - Can cancel his referral to advanced GI service at [facility] in favor of likely surgical resection - CT chest/abdomen/pelvis now for cancer staging - Check CEA - Please get a message to his primary Oncologist at [facility], Dr. Reginia Naas for review and expedited evaluation

## 2022-12-04 NOTE — Telephone Encounter (Signed)
Patients wife called asking for any instructions for a CT scan scheduled for the patient. Also would like to know since there are new findings would the patient need to come in sooner then December to follow up. Please advise.

## 2022-12-04 NOTE — Telephone Encounter (Signed)
Patient has been scheduled for CT chest/abdomen/pelvis with contrast at [facility] on Thursday, 12/06/22 at 530 pm, 330 pm arrival, NPO 4 hours prior. Orders for CEA lab have been entered into our system. Referral for [facility] has been cancelled (spoke with Crystal at phone [phone]). Appointment with [facility] has been initiated. 29 pages of records sent to fax [phone]. I have contacted Dr Reginia Naas, patient's [facility] primary oncologist's office and have spoken to Marylene Land to advise of this new diagnosis and ask for follow up. In addition, will CC this note thru EPIC to Dr Lattie Corns.    I have spoken to patient to advise of appointment time/date/location/prep for CT. I have also advised of need for CEA test. Advised that we have cancelled referral to [facility] and have instead sent a referral to [facility]. Advised we have contacted Dr Moshe Salisbury office to make him aware of new diagnosis as well. Patient verbalizes understanding of this information.

## 2022-12-04 NOTE — Addendum Note (Signed)
Addended by: Richardson Chiquito on: 12/04/2022 05:10 PM   Modules accepted: Orders

## 2022-12-05 ENCOUNTER — Other Ambulatory Visit: Payer: Medicare Other

## 2022-12-05 DIAGNOSIS — C189 Malignant neoplasm of colon, unspecified: Secondary | ICD-10-CM | POA: Diagnosis not present

## 2022-12-05 NOTE — Telephone Encounter (Signed)
I have again contacted [facility] to schedule an urgent appointment for patient as I have yet to get faxes to go thru to the 2 numbers provided me yesterday. Spoke with Cephus Slater at [facility] who says that a surgery scheduler will need to schedule the patient. I was sent to a voicemail for surgery scheduling and have left a detailed message asking for appointment. I will continue to closely follow up (phone number [facility] [phone]).

## 2022-12-06 ENCOUNTER — Encounter (HOSPITAL_COMMUNITY): Payer: Self-pay

## 2022-12-06 ENCOUNTER — Ambulatory Visit (HOSPITAL_COMMUNITY)
Admission: RE | Admit: 2022-12-06 | Discharge: 2022-12-06 | Disposition: A | Discharge status: Home / Self Care | Payer: Medicare Other | Source: Ambulatory Visit | Attending: Gastroenterology | Admitting: Gastroenterology

## 2022-12-06 DIAGNOSIS — C189 Malignant neoplasm of colon, unspecified: Secondary | ICD-10-CM | POA: Insufficient documentation

## 2022-12-06 DIAGNOSIS — I700 Atherosclerosis of aorta: Secondary | ICD-10-CM | POA: Diagnosis not present

## 2022-12-06 DIAGNOSIS — K5730 Diverticulosis of large intestine without perforation or abscess without bleeding: Secondary | ICD-10-CM | POA: Diagnosis not present

## 2022-12-06 LAB — CEA: CEA: <2 ng/mL

## 2022-12-06 MED ORDER — IOHEXOL 300 MG/ML  SOLN
100.0000 mL | Freq: Once | INTRAMUSCULAR | Status: AC | PRN
  Administered 2022-12-06: 100 mL via INTRAVENOUS

## 2022-12-06 MED ORDER — SODIUM CHLORIDE (PF) 0.9 % IJ SOLN
INTRAMUSCULAR | Status: AC
  Filled 2022-12-06: qty 50

## 2022-12-06 MED ORDER — IOHEXOL 9 MG/ML PO SOLN
ORAL | Status: AC
  Filled 2022-12-06: qty 1000

## 2022-12-06 MED ORDER — IOHEXOL 9 MG/ML PO SOLN
1000.0000 mL | Freq: Once | ORAL | Status: AC
  Administered 2022-12-06: 1000 mL via ORAL

## 2022-12-06 NOTE — Telephone Encounter (Signed)
Patient has been scheduled with Dr Luciano Cutter at [facility] on 12/18/22. I have faxed colonoscopy report and pathology to Festus Barren at fax [phone].

## 2022-12-09 ENCOUNTER — Emergency Department (HOSPITAL_COMMUNITY): Payer: Medicare Other

## 2022-12-09 ENCOUNTER — Inpatient Hospital Stay (HOSPITAL_COMMUNITY)
Admission: EM | Admit: 2022-12-09 | Discharge: 2022-12-11 | DRG: 389 | Disposition: A | Discharge status: Home / Self Care | Payer: Medicare Other | Attending: Family Medicine | Admitting: Family Medicine

## 2022-12-09 ENCOUNTER — Encounter (HOSPITAL_COMMUNITY): Payer: Self-pay | Admitting: *Deleted

## 2022-12-09 ENCOUNTER — Other Ambulatory Visit: Payer: Self-pay

## 2022-12-09 ENCOUNTER — Telehealth: Payer: Self-pay | Admitting: Gastroenterology

## 2022-12-09 DIAGNOSIS — R1084 Generalized abdominal pain: Secondary | ICD-10-CM | POA: Diagnosis not present

## 2022-12-09 DIAGNOSIS — K56600 Partial intestinal obstruction, unspecified as to cause: Secondary | ICD-10-CM | POA: Diagnosis not present

## 2022-12-09 DIAGNOSIS — Z7901 Long term (current) use of anticoagulants: Secondary | ICD-10-CM

## 2022-12-09 DIAGNOSIS — K219 Gastro-esophageal reflux disease without esophagitis: Secondary | ICD-10-CM | POA: Diagnosis present

## 2022-12-09 DIAGNOSIS — N401 Enlarged prostate with lower urinary tract symptoms: Secondary | ICD-10-CM | POA: Insufficient documentation

## 2022-12-09 DIAGNOSIS — M0600 Rheumatoid arthritis without rheumatoid factor, unspecified site: Secondary | ICD-10-CM | POA: Diagnosis not present

## 2022-12-09 DIAGNOSIS — C186 Malignant neoplasm of descending colon: Secondary | ICD-10-CM | POA: Diagnosis present

## 2022-12-09 DIAGNOSIS — N042 Nephrotic syndrome with diffuse membranous glomerulonephritis, unspecified: Secondary | ICD-10-CM | POA: Diagnosis present

## 2022-12-09 DIAGNOSIS — Z7952 Long term (current) use of systemic steroids: Secondary | ICD-10-CM | POA: Diagnosis not present

## 2022-12-09 DIAGNOSIS — N189 Chronic kidney disease, unspecified: Secondary | ICD-10-CM | POA: Diagnosis present

## 2022-12-09 DIAGNOSIS — E041 Nontoxic single thyroid nodule: Secondary | ICD-10-CM | POA: Diagnosis present

## 2022-12-09 DIAGNOSIS — K5730 Diverticulosis of large intestine without perforation or abscess without bleeding: Secondary | ICD-10-CM | POA: Diagnosis not present

## 2022-12-09 DIAGNOSIS — I2699 Other pulmonary embolism without acute cor pulmonale: Secondary | ICD-10-CM | POA: Diagnosis not present

## 2022-12-09 DIAGNOSIS — Z888 Allergy status to other drugs, medicaments and biological substances status: Secondary | ICD-10-CM | POA: Diagnosis not present

## 2022-12-09 DIAGNOSIS — Z90410 Acquired total absence of pancreas: Secondary | ICD-10-CM

## 2022-12-09 DIAGNOSIS — I10 Essential (primary) hypertension: Secondary | ICD-10-CM | POA: Diagnosis not present

## 2022-12-09 DIAGNOSIS — N400 Enlarged prostate without lower urinary tract symptoms: Secondary | ICD-10-CM | POA: Diagnosis present

## 2022-12-09 DIAGNOSIS — Z79899 Other long term (current) drug therapy: Secondary | ICD-10-CM | POA: Diagnosis not present

## 2022-12-09 DIAGNOSIS — Z86718 Personal history of other venous thrombosis and embolism: Secondary | ICD-10-CM

## 2022-12-09 DIAGNOSIS — E7800 Pure hypercholesterolemia, unspecified: Secondary | ICD-10-CM | POA: Diagnosis present

## 2022-12-09 DIAGNOSIS — Z86711 Personal history of pulmonary embolism: Secondary | ICD-10-CM | POA: Diagnosis not present

## 2022-12-09 DIAGNOSIS — Z90411 Acquired partial absence of pancreas: Secondary | ICD-10-CM | POA: Diagnosis not present

## 2022-12-09 DIAGNOSIS — Z87441 Personal history of nephrotic syndrome: Secondary | ICD-10-CM

## 2022-12-09 DIAGNOSIS — Z9049 Acquired absence of other specified parts of digestive tract: Secondary | ICD-10-CM

## 2022-12-09 DIAGNOSIS — Z87891 Personal history of nicotine dependence: Secondary | ICD-10-CM | POA: Diagnosis not present

## 2022-12-09 DIAGNOSIS — K8681 Exocrine pancreatic insufficiency: Secondary | ICD-10-CM | POA: Diagnosis present

## 2022-12-09 DIAGNOSIS — K5669 Other partial intestinal obstruction: Secondary | ICD-10-CM | POA: Diagnosis not present

## 2022-12-09 DIAGNOSIS — K56609 Unspecified intestinal obstruction, unspecified as to partial versus complete obstruction: Secondary | ICD-10-CM | POA: Diagnosis not present

## 2022-12-09 LAB — COMPREHENSIVE METABOLIC PANEL
ALT: 23 U/L (ref 0–44)
AST: 22 U/L (ref 15–41)
Albumin: 3.6 g/dL (ref 3.5–5.0)
Alkaline Phosphatase: 43 U/L (ref 38–126)
Anion gap: 12 (ref 5–15)
BUN: 16 mg/dL (ref 8–23)
CO2: 28 mmol/L (ref 22–32)
Calcium: 10 mg/dL (ref 8.9–10.3)
Chloride: 98 mmol/L (ref 98–111)
Creatinine, Ser: 0.83 mg/dL (ref 0.61–1.24)
GFR, Estimated: >60 mL/min (ref >60)
Glucose, Bld: 121 mg/dL — ABNORMAL HIGH (ref 70–99)
Potassium: 4.1 mmol/L (ref 3.5–5.1)
Sodium: 138 mmol/L (ref 135–145)
Total Bilirubin: 1.1 mg/dL (ref 0.3–1.2)
Total Protein: 6 g/dL — ABNORMAL LOW (ref 6.5–8.1)

## 2022-12-09 LAB — URINALYSIS, ROUTINE W REFLEX MICROSCOPIC
Bacteria, UA: NONE SEEN
Bilirubin Urine: NEGATIVE
Glucose, UA: NEGATIVE mg/dL
Hgb urine dipstick: NEGATIVE
Ketones, ur: 5 mg/dL — AB
Leukocytes,Ua: NEGATIVE
Nitrite: NEGATIVE
Protein, ur: 100 mg/dL — AB
Specific Gravity, Urine: 1.02 (ref 1.005–1.030)
pH: 5 (ref 5.0–8.0)

## 2022-12-09 LAB — CBC
HCT: 39.3 % (ref 39.0–52.0)
Hemoglobin: 12.9 g/dL — ABNORMAL LOW (ref 13.0–17.0)
MCH: 32 pg (ref 26.0–34.0)
MCHC: 32.8 g/dL (ref 30.0–36.0)
MCV: 97.5 fL (ref 80.0–100.0)
Platelets: 380 10*3/uL (ref 150–400)
RBC: 4.03 MIL/uL — ABNORMAL LOW (ref 4.22–5.81)
RDW: 13.2 % (ref 11.5–15.5)
WBC: 10.8 10*3/uL — ABNORMAL HIGH (ref 4.0–10.5)
nRBC: 0 % (ref 0.0–0.2)

## 2022-12-09 LAB — LIPASE, BLOOD: Lipase: 23 U/L (ref 11–51)

## 2022-12-09 MED ORDER — ATORVASTATIN CALCIUM 40 MG PO TABS
40.0000 mg | ORAL_TABLET | Freq: Every day | ORAL | Status: DC
  Administered 2022-12-09 – 2022-12-10 (×2): 40 mg via ORAL
  Filled 2022-12-09 (×2): qty 1

## 2022-12-09 MED ORDER — FENTANYL CITRATE PF 50 MCG/ML IJ SOSY
50.0000 ug | PREFILLED_SYRINGE | Freq: Once | INTRAMUSCULAR | Status: AC
  Administered 2022-12-09: 50 ug via INTRAVENOUS
  Filled 2022-12-09: qty 1

## 2022-12-09 MED ORDER — PREDNISONE 5 MG PO TABS
9.0000 mg | ORAL_TABLET | Freq: Every day | ORAL | Status: DC
  Administered 2022-12-10 – 2022-12-11 (×2): 9 mg via ORAL
  Filled 2022-12-09 (×2): qty 4

## 2022-12-09 MED ORDER — HYDROMORPHONE HCL 1 MG/ML IJ SOLN: 0.5000 mg | INTRAMUSCULAR | Status: DC | PRN

## 2022-12-09 MED ORDER — AMLODIPINE BESYLATE 5 MG PO TABS
5.0000 mg | ORAL_TABLET | Freq: Every day | ORAL | Status: DC
  Administered 2022-12-09 – 2022-12-10 (×2): 5 mg via ORAL
  Filled 2022-12-09 (×2): qty 1

## 2022-12-09 MED ORDER — LOSARTAN POTASSIUM 50 MG PO TABS
100.0000 mg | ORAL_TABLET | Freq: Every day | ORAL | Status: DC
  Administered 2022-12-09 – 2022-12-10 (×2): 100 mg via ORAL
  Filled 2022-12-09 (×2): qty 2

## 2022-12-09 MED ORDER — ACETAMINOPHEN 325 MG PO TABS
650.0000 mg | ORAL_TABLET | Freq: Four times a day (QID) | ORAL | Status: DC | PRN
  Administered 2022-12-10: 650 mg via ORAL
  Filled 2022-12-09: qty 2

## 2022-12-09 MED ORDER — HYDROXYCHLOROQUINE SULFATE 200 MG PO TABS
400.0000 mg | ORAL_TABLET | Freq: Every day | ORAL | Status: DC
  Administered 2022-12-09 – 2022-12-10 (×2): 400 mg via ORAL
  Filled 2022-12-09 (×3): qty 2

## 2022-12-09 MED ORDER — FUROSEMIDE 20 MG PO TABS
20.0000 mg | ORAL_TABLET | Freq: Every day | ORAL | Status: DC
  Administered 2022-12-10 – 2022-12-11 (×2): 20 mg via ORAL
  Filled 2022-12-09 (×2): qty 1

## 2022-12-09 MED ORDER — SODIUM CHLORIDE 0.9% FLUSH
3.0000 mL | Freq: Two times a day (BID) | INTRAVENOUS | Status: DC
  Administered 2022-12-09 – 2022-12-11 (×4): 3 mL via INTRAVENOUS

## 2022-12-09 MED ORDER — ACETAMINOPHEN 650 MG RE SUPP: 650.0000 mg | Freq: Four times a day (QID) | RECTAL | Status: DC | PRN

## 2022-12-09 MED ORDER — FINASTERIDE 5 MG PO TABS
5.0000 mg | ORAL_TABLET | Freq: Every day | ORAL | Status: DC
  Administered 2022-12-09 – 2022-12-10 (×2): 5 mg via ORAL
  Filled 2022-12-09 (×2): qty 1

## 2022-12-09 MED ORDER — PANTOPRAZOLE SODIUM 40 MG PO TBEC
40.0000 mg | DELAYED_RELEASE_TABLET | Freq: Two times a day (BID) | ORAL | Status: DC
  Administered 2022-12-09 – 2022-12-11 (×4): 40 mg via ORAL
  Filled 2022-12-09 (×4): qty 1

## 2022-12-09 NOTE — ED Notes (Signed)
Pt to CT

## 2022-12-09 NOTE — Telephone Encounter (Signed)
Patient's wife called earlier this morning.   Patient recently had colonoscopy with Dr. Barron Alvine 8/21.  This showed a large polyp that could not be resected and patient is scheduled for general surgery consultation in 1 week for further evaluation.  Patient's wife states he has not been doing well since and over the last 2 days has had increasing abdominal pain that became severe last night.  Has not passed gas or passed a bowel movement and she would like some advice.  Upon further discussion recommend patient proceed to emergency department due to severity of pain.

## 2022-12-09 NOTE — ED Triage Notes (Signed)
PT states severe, generalized abdominal pain since last night.  States minimal nausea and heartburn and normal bowel movements.  States upper endoscopy/colonoscopy on August 21st.  Polyps removed with one being found cancerous.  Was told to notify GI doc on call when he arrives.

## 2022-12-09 NOTE — ED Provider Notes (Signed)
Stephen Gross EMERGENCY DEPARTMENT AT [facility] Provider Note   CSN: [id number] Arrival date & time: 12/09/22  1117     History  Chief Complaint  Patient presents with   Abdominal Pain    Stephen Gross is a 77 y.o. male.  Patient is a 77 year old male who presents with abdominal pain.  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  he had a recent colonoscopy on August 21 which showed a large polyp.  Biopsies were concerning for cancerous lesion.  He has been referred to surgery at [facility] for removal but has not yet seen them.  He started having some worsening abdominal pain yesterday and into the night.  He has not been able to have a bowel movement or passed gas during the day today.  He has some increased acid reflux and nausea but no vomiting.  No fevers, no urinary symptoms       Home Medications Prior to Admission medications   Medication Sig Start Date End Date Taking? Authorizing Provider  acetaminophen (TYLENOL) 650 MG CR tablet Take 1,300 mg by mouth in the morning and at bedtime.    [provider]  amLODipine (NORVASC) 5 MG tablet Take 5 mg by mouth daily.    [provider]  atorvastatin (LIPITOR) 40 MG tablet Take 40 mg by mouth at bedtime.  12/06/17   [provider]  Calcium Carb-Cholecalciferol (CALCIUM 600 + D PO) Take 1 capsule by mouth daily.    [provider]  Cyanocobalamin (B-12) 1000 MCG TABS Take 1 tablet by mouth daily.    [provider]  cyclobenzaprine (FLEXERIL) 5 MG tablet Take 1 tablet (5 mg total) by mouth 2 (two) times daily as needed for muscle spasms. 05/25/22   Renne Crigler, PA-C  docusate sodium (COLACE) 100 MG capsule Take 200 mg by mouth at bedtime.    [provider]  enoxaparin (LOVENOX) 80 MG/0.8ML injection Inject 80 mg into the skin every 12 (twelve) hours. 11/21/22   [provider]  ferrous sulfate 325 (65 FE) MG tablet  Take 325 mg by mouth daily with breakfast.    [provider]  finasteride (PROSCAR) 5 MG tablet Take 5 mg by mouth daily.    [provider]  furosemide (LASIX) 20 MG tablet Take 20 mg by mouth daily.    [provider]  gabapentin (NEURONTIN) 300 MG capsule Take 300 mg by mouth at bedtime.    [provider]  hydroxychloroquine (PLAQUENIL) 200 MG tablet Take 400 mg by mouth daily.    [provider]  lidocaine (LIDODERM) 5 % Place 1 patch onto the skin daily. Remove & Discard patch within 12 hours or as directed by MD 05/25/22   Renne Crigler, PA-C  losartan (COZAAR) 100 MG tablet Take 100 mg by mouth daily.    [provider]  Melatonin 5 MG CAPS Take 1 capsule by mouth daily.    [provider]  Na Sulfate-K Sulfate-Mg Sulf 17.5-3.13-1.6 GM/177ML SOLN SMARTSIG:1 Kit(s) By Mouth Once 10/10/22   [provider]  ondansetron (ZOFRAN-ODT) 4 MG disintegrating tablet Take 4 mg by mouth every 8 (eight) hours as needed. Patient not taking: Reported on 11/28/2022 08/15/22   [provider]  pantoprazole (PROTONIX) 40 MG tablet TAKE 1 TABLET(40 MG) BY MOUTH TWICE DAILY 11/29/22   Cirigliano, Vito V, DO  predniSONE (DELTASONE) 10 MG tablet Take 10 mg by mouth daily with breakfast.  [provider]  tamsulosin (FLOMAX) 0.4 MG CAPS capsule Take by mouth. Patient not taking: Reported on 11/28/2022 02/29/20   [provider]  XARELTO 20 MG TABS tablet Take 20 mg by mouth daily. 02/20/19   [provider]  zinc gluconate 50 MG tablet Take 50 mg by mouth daily.    [provider]      Allergies    Lisinopril and Nsaids    Review of Systems   Review of Systems  Constitutional:  Negative for chills, diaphoresis, fatigue and fever.  HENT:  Negative for congestion, rhinorrhea and sneezing.   Eyes: Negative.   Respiratory:  Negative for cough, chest tightness and shortness of breath.    Cardiovascular:  Negative for chest pain and leg swelling.  Gastrointestinal:  Positive for abdominal pain and nausea. Negative for blood in stool, diarrhea and vomiting.  Genitourinary:  Negative for difficulty urinating, flank pain, frequency and hematuria.  Musculoskeletal:  Negative for arthralgias and back pain.  Skin:  Negative for rash.  Neurological:  Negative for dizziness, speech difficulty, weakness, numbness and headaches.    Physical Exam Updated Vital Signs BP (!) 144/77   Pulse (!) 59   Temp 97.9 F (36.6 C) (Oral)   Resp 15   Ht 6\' 1"  (1.854 m)   Wt 88.5 kg   SpO2 93%   BMI 25.73 kg/m  Physical Exam Constitutional:      Appearance: He is well-developed.  HENT:     Head: Normocephalic and atraumatic.  Eyes:     Pupils: Pupils are equal, round, and reactive to light.  Cardiovascular:     Rate and Rhythm: Normal rate and regular rhythm.     Heart sounds: Normal heart sounds.  Pulmonary:     Effort: Pulmonary effort is normal. No respiratory distress.     Breath sounds: Normal breath sounds. No wheezing or rales.  Chest:     Chest wall: No tenderness.  Abdominal:     General: Bowel sounds are normal.     Palpations: Abdomen is soft.     Tenderness: There is abdominal tenderness in the left upper quadrant and left lower quadrant. There is no guarding or rebound.  Musculoskeletal:        General: Normal range of motion.     Cervical back: Normal range of motion and neck supple.  Lymphadenopathy:     Cervical: No cervical adenopathy.  Skin:    General: Skin is warm and dry.     Findings: No rash.  Neurological:     Mental Status: He is alert and oriented to person, place, and time.     ED Results / Procedures / Treatments   Labs (all labs ordered are listed, but only abnormal results are displayed) Labs Reviewed  COMPREHENSIVE METABOLIC PANEL - Abnormal; Notable for the following components:      Result Value   Glucose, Bld 121 (*)    Total  Protein 6.0 (*)    All other components within normal limits  CBC - Abnormal; Notable for the following components:   WBC 10.8 (*)    RBC 4.03 (*)    Hemoglobin 12.9 (*)    All other components within normal limits  URINALYSIS, ROUTINE W REFLEX MICROSCOPIC - Abnormal; Notable for the following components:   Ketones, ur 5 (*)    Protein, ur 100 (*)    All other components within normal limits  LIPASE, BLOOD    EKG None  Radiology CT ABDOMEN  PELVIS WO CONTRAST  Result Date: 12/09/2022 CLINICAL DATA:  Concern of bowel obstruction. Worsened abdominal pain for 2 days. Abdominal pain. Bowel obstruction suspected. Colonoscopy on 08/21. Remote Whipple procedure. * Tracking Code: BO * EXAM: CT ABDOMEN AND PELVIS WITHOUT CONTRAST TECHNIQUE: Multidetector CT imaging of the abdomen and pelvis was performed following the standard protocol without IV contrast. RADIATION DOSE REDUCTION: This exam was performed according to the departmental dose-optimization program which includes automated exposure control, adjustment of the mA and/or kV according to patient size and/or use of iterative reconstruction technique. COMPARISON:  Chest abdomen and pelvic CTs of 12/06/2022. Plain film abdomen of earlier today FINDINGS: Lower chest: Emphysema. Normal heart size without pericardial or pleural effusion. Hepatobiliary: Pneumobilia.  Choledochojejunostomy. Pancreas: Normal pancreatic body and tail. Spleen: Normal in size, without focal abnormality. Adrenals/Urinary Tract: Normal adrenal glands. No renal calculi or hydronephrosis. No hydroureter or ureteric calculi. No bladder calculi. Median lobe impression into the urinary bladder. Stomach/Bowel: Gastrojejunostomy. Extensive colonic diverticulosis. Normal terminal ileum and appendix. Mid small bowel loops dilated up to 3.7 cm and fluid-filled, new. Followed to the level of the mid ileum, with gradual transition to decompressed distal ileum. No obstructive mass identified.  Vascular/Lymphatic: Aortic atherosclerosis. No abdominopelvic adenopathy. Reproductive: Mild prostatomegaly. Other: No pneumatosis or free intraperitoneal air. New small volume right pelvic fluid including on 73/3. No specific evidence of complicating bowel ischemia. Musculoskeletal: No acute osseous abnormality. IMPRESSION: 1. Partial small bowel obstruction to the level of the mid ileum, presumably secondary to adhesions. Secondary new small volume pelvic fluid without specific evidence of complicating ischemia. 2. Stable findings including prior Whipple procedure, as detailed on staging CT of 3 days ago. 3. Incidental findings, including: No Aortic Atherosclerosis (ICD10-I70.0). Prostatomegaly. Emphysema (ICD10-J43.9). Electronically Signed   By: Jeronimo Greaves M.D.   On: 12/09/2022 14:54   DG Abdomen Acute W/Chest  Result Date: 12/09/2022 CLINICAL DATA:  Abdominal pain. Recent upper endoscopy and colonoscopy. PT states severe, generalized abdominal pain since last night. States minimal nausea and heartburn and normal bowel movements. States upper endoscopy/colonoscopy on August 21st. Polyps removed with one being found cancerous. abdominal pain EXAM: DG ABDOMEN ACUTE WITH 1 VIEW CHEST COMPARISON:  CT 12/06/2022 FINDINGS: Lungs are clear. Normal mediastinum no free air beneath the hemidiaphragms Air-fluid levels within the small bowel. Gas in the descending colon. Stool in the rectum from recent CT exam. IMPRESSION: 1. Normal chest x-ray. 2. Small bowel ileus pattern versus early obstruction. Air-fluid levels without dilated bowel. 3. Normal colon with stool in rectum. Electronically Signed   By: Genevive Bi M.D.   On: 12/09/2022 13:15    Procedures Procedures    Medications Ordered in ED Medications  fentaNYL (SUBLIMAZE) injection 50 mcg (50 mcg Intravenous Given 12/09/22 1352)    ED Course/ Medical Decision Making/ A&P                                 Medical Decision Making Amount and/or  Complexity of Data Reviewed Labs: ordered. Radiology: ordered.  Risk Prescription drug management.   Patient is a 77 year old who presents with abdominal pain.  He has had prior abdominal surgeries including a Whipple procedure.  Abdominal series shows concerns for obstruction.  This was interpreted by me and confirmed by the radiologist.  CT scan shows partial small bowel obstruction likely related to adhesions.  Labs were reviewed.  I did consult with the patient's gastroenterologist who is seen  the patient.  Will also consult surgery.  Discussed with Dr. Dossie Der who will see the pt.  Will hold off on NGT until surgery see the pt.  Final Clinical Impression(s) / ED Diagnoses Final diagnoses:  SBO (small bowel obstruction) (HCC)    Rx / DC Orders ED Discharge Orders     None         Rolan Bucco, MD 12/09/22 1556

## 2022-12-09 NOTE — Telephone Encounter (Signed)
Thank you.  Patient evaluated in the ER today.

## 2022-12-09 NOTE — Consult Note (Signed)
Consulting Physician: Hyman Hopes Jonatha Gagen  Referring Provider: Dr. Alinda Money  Chief Complaint: Abdominal pain  Reason for Consult: Small bowel obstruction   Subjective   HPI: Stephen Gross is an 77 y.o. male who is here for abdominal pain.  He has had a Whipple in the past.  He has recent diagnosis of colon cancer and an appointment at [facility] in [facility] for evaluation for right colectomy on 10 September.  Last night he was darted experiencing severe abdominal pain so presented to the emergency room today.  He has had some nausea but no vomiting.  CT was concerning for bowel obstruction so surgery team was consulted.  Narcotic pain medication has made him feel somewhat better and he is passed some gas in the emergency room.  No bowel movements in a number of days.  Past Medical History:  Diagnosis Date   ANA positive    Arthritis    Blood clot in vein    in lung; unprevoked pulmonary embolisms in  2018 and 04/07/2018 managed on xarelto    Cancer [facility])    insulinoma   Diverticulosis    DVT (deep venous thrombosis) (HCC)    E coli bacteremia    Hypertension    Insulinoma    Membranous glomerulonephritis    Pancreatitis    Pulmonary embolism (HCC)    Pure hypercholesterolemia    Thyroid nodule     Past Surgical History:  Procedure Laterality Date   CARPAL TUNNEL RELEASE Bilateral    CHOLECYSTECTOMY     INGUINAL HERNIA REPAIR Left 03/19/2019   Procedure: OPEN REPAIR OF LEFT INGUINAL HERNIA WITH MESH;  Surgeon: Gaynelle Adu, MD;  Location: [facility] ORS;  Service: General;  Laterality: Left;   PANCREATICODUODENECTOMY  2002   RENAL BIOPSY     TONSILLECTOMY     WHIPPLE PROCEDURE      Family History  Problem Relation Age of Onset   Heart disease Mother    Heart disease Father    Lung cancer Paternal Uncle    Colon cancer Neg Hx    Esophageal cancer Neg Hx     Social:  reports that he quit smoking about 52 years ago. His smoking use included cigarettes. He has never used smokeless  tobacco. He reports current alcohol use. He reports that he does not use drugs.  Allergies:  Allergies  Allergen Reactions   Lisinopril Cough   Nsaids     Bleeding internally    Medications: Current Outpatient Medications  Medication Instructions   acetaminophen (TYLENOL) 650 mg, Oral, 2 times daily   amLODipine (NORVASC) 5 mg, Oral, Daily at bedtime   atorvastatin (LIPITOR) 40 mg, Oral, Daily at bedtime   Calcium Carb-Cholecalciferol (CALCIUM 600 + D PO) 1 capsule, Oral, Daily   Cyanocobalamin (B-12) 1000 MCG TABS 1 tablet, Oral, Daily   ferrous sulfate 325 mg, Oral, Daily with breakfast   finasteride (PROSCAR) 5 mg, Oral, Daily at bedtime   furosemide (LASIX) 20 mg, Oral, Daily   hydroxychloroquine (PLAQUENIL) 400 mg, Oral, Daily at bedtime   losartan (COZAAR) 100 mg, Oral, Daily at bedtime   Melatonin 5 MG CAPS 1 capsule, Oral, Daily   pantoprazole (PROTONIX) 40 MG tablet TAKE 1 TABLET(40 MG) BY MOUTH TWICE DAILY   predniSONE (DELTASONE) 9 mg, Oral, Daily with breakfast   senna (SENOKOT) 8.6 MG tablet 2 tablets, Oral, Daily   Xarelto 20 mg, Oral, Daily   zinc gluconate 50 mg, Oral, Daily    ROS - all of the  below systems have been reviewed with the patient and positives are indicated with bold text General: chills, fever or night sweats Eyes: blurry vision or double vision ENT: epistaxis or sore throat Allergy/Immunology: itchy/watery eyes or nasal congestion Hematologic/Lymphatic: bleeding problems, blood clots or swollen lymph nodes Endocrine: temperature intolerance or unexpected weight changes Breast: new or changing breast lumps or nipple discharge Resp: cough, shortness of breath, or wheezing CV: chest pain or dyspnea on exertion GI: as per HPI GU: dysuria, trouble voiding, or hematuria MSK: joint pain or joint stiffness Neuro: TIA or stroke symptoms Derm: pruritus and skin lesion changes Psych: anxiety and depression  Objective   PE Blood pressure 135/84,  pulse 63, temperature 97.9 F (36.6 C), temperature source Oral, resp. rate 18, height 6\' 1"  (1.854 m), weight 88.5 kg, SpO2 97%. Constitutional: NAD; conversant; no deformities Eyes: Moist conjunctiva; no lid lag; anicteric; PERRL Neck: Trachea midline; no thyromegaly Lungs: Normal respiratory effort; no tactile fremitus CV: RRR; no palpable thrills; no pitting edema GI: Abd soft, mild distention, nontender; no palpable hepatosplenomegaly MSK: Normal range of motion of extremities; no clubbing/cyanosis Psychiatric: Appropriate affect; alert and oriented x3 Lymphatic: No palpable cervical or axillary lymphadenopathy  Results for orders placed or performed during the hospital encounter of 12/09/22 (from the past 24 hour(s))  Urinalysis, Routine w reflex microscopic -Urine, Clean Catch     Status: Abnormal   Collection Time: 12/09/22 11:44 AM  Result Value Ref Range   Color, Urine YELLOW YELLOW   APPearance CLEAR CLEAR   Specific Gravity, Urine 1.020 1.005 - 1.030   pH 5.0 5.0 - 8.0   Glucose, UA NEGATIVE NEGATIVE mg/dL   Hgb urine dipstick NEGATIVE NEGATIVE   Bilirubin Urine NEGATIVE NEGATIVE   Ketones, ur 5 (A) NEGATIVE mg/dL   Protein, ur 191 (A) NEGATIVE mg/dL   Nitrite NEGATIVE NEGATIVE   Leukocytes,Ua NEGATIVE NEGATIVE   RBC / HPF 0-5 0 - 5 RBC/hpf   WBC, UA 0-5 0 - 5 WBC/hpf   Bacteria, UA NONE SEEN NONE SEEN   Squamous Epithelial / HPF 0-5 0 - 5 /HPF   Mucus PRESENT   Lipase, blood     Status: None   Collection Time: 12/09/22 12:04 PM  Result Value Ref Range   Lipase 23 11 - 51 U/L  Comprehensive metabolic panel     Status: Abnormal   Collection Time: 12/09/22 12:04 PM  Result Value Ref Range   Sodium 138 135 - 145 mmol/L   Potassium 4.1 3.5 - 5.1 mmol/L   Chloride 98 98 - 111 mmol/L   CO2 28 22 - 32 mmol/L   Glucose, Bld 121 (H) 70 - 99 mg/dL   BUN 16 8 - 23 mg/dL   Creatinine, Ser 4.78 0.61 - 1.24 mg/dL   Calcium 29.5 8.9 - 62.1 mg/dL   Total Protein 6.0 (L) 6.5  - 8.1 g/dL   Albumin 3.6 3.5 - 5.0 g/dL   AST 22 15 - 41 U/L   ALT 23 0 - 44 U/L   Alkaline Phosphatase 43 38 - 126 U/L   Total Bilirubin 1.1 0.3 - 1.2 mg/dL   GFR, Estimated >30 >86 mL/min   Anion gap 12 5 - 15  CBC     Status: Abnormal   Collection Time: 12/09/22 12:04 PM  Result Value Ref Range   WBC 10.8 (H) 4.0 - 10.5 K/uL   RBC 4.03 (L) 4.22 - 5.81 MIL/uL   Hemoglobin 12.9 (L) 13.0 - 17.0 g/dL  HCT 39.3 39.0 - 52.0 %   MCV 97.5 80.0 - 100.0 fL   MCH 32.0 26.0 - 34.0 pg   MCHC 32.8 30.0 - 36.0 g/dL   RDW 16.1 09.6 - 04.5 %   Platelets 380 150 - 400 K/uL   nRBC 0.0 0.0 - 0.2 %     Imaging Orders         DG Abdomen Acute W/Chest         CT ABDOMEN PELVIS WO CONTRAST      Assessment and Plan   Keyshun Cohn is an 77 y.o. male with a history of Whipple procedure, recent diagnosis of right colon cancer that is undergoing staging workup, who presented with abdominal pain and found to have signs of a partial small bowel obstruction on CT.  Thankfully is already passing some gas and starting to feel little bit better with some pain medication.  His abdominal exam is not concerning at this time.  I recommend observing him overnight and hopefully he will continue to feel better.  If not, we could consider placing a nasogastric tube for decompression, and also could consider a small bowel obstruction protocol with contrast administration and serial x-rays.  I am hopeful that he will be able to avoid surgery this hospitalization, and explained if he did need surgery the right colon cancer may or may not be addressed depending on intraoperative findings.    ICD-10-CM   1. SBO (small bowel obstruction) (HCC)  K56.609        Quentin Ore, MD  [facility] Use [email or web address] to contact on call provider  New Patient Billing: [id number] - Moderate MDM

## 2022-12-09 NOTE — H&P (Signed)
History and Physical   Harvard Quattrochi LOV:[id number] DOB: [birth year] DOA: 12/09/2022  PCP: Casper Harrison, Stephanie Coup, MD   Patient coming from: Home  Chief Complaint: Abdominal Pain  HPI: Stephen Gross is a 77 y.o. male with medical history significant of HTN, HLD, CKD, Nephrotic Syndrome, membranous glomerulonephritis, insulinoma status post Whipple, recurrent PE, diverticulosis, rheumatoid arthritis presenting with abdominal pain.  Patient had onset of abdominal pain around the time he dinner yesterday.  Had 1 bowel movement after dinner.  No bowel movement or flatus throughout the day today.  Had significant worsening of abdominal pain around 2 AM that has persisted until receiving pain medication in the ED.  Some increased GERD symptoms with nausea during the day but no vomiting.  Denies fever, chills, chest pain, shortness of breath.  Of note, recent colonoscopy on August 21 with a large polyp which was biopsied and came back for adenocarcinoma.  Has been referred to [facility] surgery but has not seen them yet for this.  ED Course: ital signs in the ED notable for blood pressure in the 130s to 140 systolic, heart rate in the 50s to 60s.  Lab workup included CMP with glucose 121, protein 6.0.  CBC with leukocytosis to 10.8, Hemoglobin stable at 12.9.  Lipase normal.  Urinalysis with ketones and proteins only.  X-ray of the chest and abdomen showed normal chest x-ray and small bowel ileus versus obstruction pattern.  CT of the abdomen pelvis showed partial SBO and status post Whipple status.  Received fentanyl in the ED.  GI and general surgery were consulted and general surgery is following.  Review of Systems: As per HPI otherwise all other systems reviewed and are negative.  Past Medical History:  Diagnosis Date   ANA positive    Arthritis    Blood clot in vein    in lung; unprevoked pulmonary embolisms in  2018 and 04/07/2018 managed on xarelto    Cancer [facility])    insulinoma   Diverticulosis     DVT (deep venous thrombosis) (HCC)    E coli bacteremia    Hypertension    Insulinoma    Membranous glomerulonephritis    Pancreatitis    Pulmonary embolism (HCC)    Pure hypercholesterolemia    Thyroid nodule     Past Surgical History:  Procedure Laterality Date   CARPAL TUNNEL RELEASE Bilateral    CHOLECYSTECTOMY     INGUINAL HERNIA REPAIR Left 03/19/2019   Procedure: OPEN REPAIR OF LEFT INGUINAL HERNIA WITH MESH;  Surgeon: Gaynelle Adu, MD;  Location: [facility];  Service: General;  Laterality: Left;   PANCREATICODUODENECTOMY  2002   RENAL BIOPSY     TONSILLECTOMY     WHIPPLE PROCEDURE      Social History  reports that he quit smoking about 52 years ago. His smoking use included cigarettes. He has never used smokeless tobacco. He reports current alcohol use. He reports that he does not use drugs.  Allergies  Allergen Reactions   Lisinopril Cough   Nsaids     Bleeding internally    Family History  Problem Relation Age of Onset   Heart disease Mother    Heart disease Father    Lung cancer Paternal Uncle    Colon cancer Neg Hx    Esophageal cancer Neg Hx   Reviewed on admission  Prior to Admission medications   Medication Sig Start Date End Date Taking? Authorizing Provider  acetaminophen (TYLENOL) 650 MG CR tablet Take 650 mg by mouth in  the morning and at bedtime.   Yes [provider]  amLODipine (NORVASC) 5 MG tablet Take 5 mg by mouth at bedtime.   Yes [provider]  atorvastatin (LIPITOR) 40 MG tablet Take 40 mg by mouth at bedtime.  12/06/17  Yes [provider]  Calcium Carb-Cholecalciferol (CALCIUM 600 + D PO) Take 1 capsule by mouth daily.   Yes [provider]  Cyanocobalamin (B-12) 1000 MCG TABS Take 1 tablet by mouth daily.   Yes [provider]  ferrous sulfate 325 (65 FE) MG tablet Take 325 mg by mouth daily with breakfast.   Yes [provider]  finasteride (PROSCAR) 5 MG tablet Take 5 mg by mouth  at bedtime.   Yes [provider]  furosemide (LASIX) 20 MG tablet Take 20 mg by mouth daily.   Yes [provider]  hydroxychloroquine (PLAQUENIL) 200 MG tablet Take 400 mg by mouth at bedtime.   Yes [provider]  losartan (COZAAR) 100 MG tablet Take 100 mg by mouth at bedtime.   Yes [provider]  Melatonin 5 MG CAPS Take 1 capsule by mouth daily.   Yes [provider]  pantoprazole (PROTONIX) 40 MG tablet TAKE 1 TABLET(40 MG) BY MOUTH TWICE DAILY 11/29/22  Yes Cirigliano, Vito V, DO  predniSONE (DELTASONE) 1 MG tablet Take 9 mg by mouth daily with breakfast.   Yes [provider]  senna (SENOKOT) 8.6 MG tablet Take 2 tablets by mouth daily.   Yes [provider]  XARELTO 20 MG TABS tablet Take 20 mg by mouth daily. 02/20/19  Yes [provider]  zinc gluconate 50 MG tablet Take 50 mg by mouth daily.   Yes [provider]    Physical Exam: Vitals:   12/09/22 1345 12/09/22 1430 12/09/22 1538 12/09/22 1730  BP: 137/82 (!) 144/77  135/84  Pulse: 60 (!) 59  63  Resp:  15  18  Temp:   97.9 F (36.6 C)   TempSrc:   Oral   SpO2: 100% 93%  97%  Weight:      Height:        Physical Exam Constitutional:      General: He is not in acute distress.    Appearance: Normal appearance.  HENT:     Head: Normocephalic and atraumatic.     Mouth/Throat:     Mouth: Mucous membranes are moist.     Pharynx: Oropharynx is clear.  Eyes:     Extraocular Movements: Extraocular movements intact.     Pupils: Pupils are equal, round, and reactive to light.  Cardiovascular:     Rate and Rhythm: Normal rate and regular rhythm.     Pulses: Normal pulses.     Heart sounds: Normal heart sounds.  Pulmonary:     Effort: Pulmonary effort is normal. No respiratory distress.     Breath sounds: Normal breath sounds.  Abdominal:     General: Bowel sounds are normal. There is no distension.     Palpations: Abdomen is soft.      Tenderness: There is no abdominal tenderness.  Musculoskeletal:        General: No swelling or deformity.  Skin:    General: Skin is warm and dry.  Neurological:     General: No focal deficit present.     Mental Status: Mental status is at baseline.    Labs on Admission: I have personally reviewed following labs and imaging studies  CBC: Recent Labs  Lab 12/09/22 1204  WBC 10.8*  HGB 12.9*  HCT 39.3  MCV 97.5  PLT 380    Basic Metabolic Panel: Recent Labs  Lab 12/09/22 1204  NA 138  K 4.1  CL 98  CO2 28  GLUCOSE 121*  BUN 16  CREATININE 0.83  CALCIUM 10.0    GFR: Estimated Creatinine Clearance: 85.6 mL/min (by C-G formula based on SCr of 0.83 mg/dL).  Liver Function Tests: Recent Labs  Lab 12/09/22 1204  AST 22  ALT 23  ALKPHOS 43  BILITOT 1.1  PROT 6.0*  ALBUMIN 3.6    Urine analysis:    Component Value Date/Time   COLORURINE YELLOW 12/09/2022 1144   APPEARANCEUR CLEAR 12/09/2022 1144   LABSPEC 1.020 12/09/2022 1144   PHURINE 5.0 12/09/2022 1144   GLUCOSEU NEGATIVE 12/09/2022 1144   HGBUR NEGATIVE 12/09/2022 1144   BILIRUBINUR NEGATIVE 12/09/2022 1144   KETONESUR 5 (A) 12/09/2022 1144   PROTEINUR 100 (A) 12/09/2022 1144   NITRITE NEGATIVE 12/09/2022 1144   LEUKOCYTESUR NEGATIVE 12/09/2022 1144    Radiological Exams on Admission: CT ABDOMEN PELVIS WO CONTRAST  Result Date: 12/09/2022 CLINICAL DATA:  Concern of bowel obstruction. Worsened abdominal pain for 2 days. Abdominal pain. Bowel obstruction suspected. Colonoscopy on 08/21. Remote Whipple procedure. * Tracking Code: BO * EXAM: CT ABDOMEN AND PELVIS WITHOUT CONTRAST TECHNIQUE: Multidetector CT imaging of the abdomen and pelvis was performed following the standard protocol without IV contrast. RADIATION DOSE REDUCTION: This exam was performed according to the departmental dose-optimization program which includes automated exposure control, adjustment of the mA and/or kV according to patient  size and/or use of iterative reconstruction technique. COMPARISON:  Chest abdomen and pelvic CTs of 12/06/2022. Plain film abdomen of earlier today FINDINGS: Lower chest: Emphysema. Normal heart size without pericardial or pleural effusion. Hepatobiliary: Pneumobilia.  Choledochojejunostomy. Pancreas: Normal pancreatic body and tail. Spleen: Normal in size, without focal abnormality. Adrenals/Urinary Tract: Normal adrenal glands. No renal calculi or hydronephrosis. No hydroureter or ureteric calculi. No bladder calculi. Median lobe impression into the urinary bladder. Stomach/Bowel: Gastrojejunostomy. Extensive colonic diverticulosis. Normal terminal ileum and appendix. Mid small bowel loops dilated up to 3.7 cm and fluid-filled, new. Followed to the level of the mid ileum, with gradual transition to decompressed distal ileum. No obstructive mass identified. Vascular/Lymphatic: Aortic atherosclerosis. No abdominopelvic adenopathy. Reproductive: Mild prostatomegaly. Other: No pneumatosis or free intraperitoneal air. New small volume right pelvic fluid including on 73/3. No specific evidence of complicating bowel ischemia. Musculoskeletal: No acute osseous abnormality. IMPRESSION: 1. Partial small bowel obstruction to the level of the mid ileum, presumably secondary to adhesions. Secondary new small volume pelvic fluid without specific evidence of complicating ischemia. 2. Stable findings including prior Whipple procedure, as detailed on staging CT of 3 days ago. 3. Incidental findings, including: No Aortic Atherosclerosis (ICD10-I70.0). Prostatomegaly. Emphysema (ICD10-J43.9). Electronically Signed   By: Jeronimo Greaves M.D.   On: 12/09/2022 14:54   DG Abdomen Acute W/Chest  Result Date: 12/09/2022 CLINICAL DATA:  Abdominal pain. Recent upper endoscopy and colonoscopy. PT states severe, generalized abdominal pain since last night. States minimal nausea and heartburn and normal bowel movements. States upper  endoscopy/colonoscopy on August 21st. Polyps removed with one being found cancerous. abdominal pain EXAM: DG ABDOMEN ACUTE WITH 1 VIEW CHEST COMPARISON:  CT 12/06/2022 FINDINGS: Lungs are clear. Normal mediastinum no free air beneath the hemidiaphragms Air-fluid levels within the small bowel. Gas in the descending colon. Stool in the rectum from recent CT exam. IMPRESSION: 1.  Normal chest x-ray. 2. Small bowel ileus pattern versus early obstruction. Air-fluid levels without dilated bowel. 3. Normal colon with stool in rectum. Electronically Signed   By: Genevive Bi M.D.   On: 12/09/2022 13:15    EKG: Not performed in the emergency department  Assessment/Plan Active Problems:   Generalized abdominal pain   Partial small bowel obstruction (HCC)   Essential (primary) hypertension   History of Whipple procedure   Long term (current) use of anticoagulants   Nephrotic syndrome with membranous glomerulonephritis   Recurrent pulmonary embolism (HCC)   Pure hypercholesterolemia   Chronic kidney disease   Seronegative rheumatoid arthritis (HCC)   Small bowel obstruction > Presenting with a day of abdominal pain and GERD symptoms with nausea but no vomiting.  No bowel movement since yesterday evening.  No flatus until had 1 episode in the ED. > CT with evidence of partial SBO.  Suspected secondary to adhesions given patient is status post Whipple for history of insulinoma. > Seen by GI and general surgery.  Hopefully will be able to avoid NG tube. - Monitor on telemetry overnight - Appreciate GI and general surgery recommendations and assistance - Anticipate possible small bowel protocol - N.p.o. except sips with meds and ice chips - Supportive care - As needed pain medication  Recent abnormal colonoscopy > Recent colonoscopy on 8/21 as per HPI large polyp biopsied and positive for adenocarcinoma.  Has been referred to [facility] surgery but has not yet seen them. - Noted  Hypertension -  Continue home amiodarone, Lasix, losartan  Recurrent PE - Holding Eliquis for now, pending general surgery recommendations - SCDs for now   History of nephrotic syndrome CKD > Creatinine stable. - Trend renal function and electrolytes  BPH - Continue finasteride  Seronegative rheumatoid arthritis - Continue home prednisone and Plaquenil  History of insulinoma > Status post Whipple as above. - Noted  DVT prophylaxis: SCDs for now, pending general surgery recommendations Code Status:   Full Family Communication:  Updated at bedside  Disposition Plan:   Patient is from:  Home  Anticipated DC to:  Home  Anticipated DC date:  1 to 3 days  Anticipated DC barriers: None  Consults called:  General Surgery, following.  GI has seen the patient and are available as needed Admission status:  Observation, telemetry  Severity of Illness: The appropriate patient status for this patient is OBSERVATION. Observation status is judged to be reasonable and necessary in order to provide the required intensity of service to ensure the patient's safety. The patient's presenting symptoms, physical exam findings, and initial radiographic and laboratory data in the context of their medical condition is felt to place them at decreased risk for further clinical deterioration. Furthermore, it is anticipated that the patient will be medically stable for discharge from the hospital within 2 midnights of admission.    Synetta Fail MD [facility]  How to contact the [facility] Attending or Consulting provider 7A - 7P or covering provider during after hours 7P -7A, for this patient?   Check the care team in [facility] and look for a) attending/consulting TR[facility] provider listed and b) the [facility] team listed Log into [email or web address] and use [facility]'s universal password to access. If you do not have the password, please contact the hospital operator. Locate the [facility] provider you are looking for under [facility] and page to a number that you can be directly reached. If you still have difficulty reaching the provider, please page the  DOC (Director on Call) for the Hospitalists listed on amion for assistance.  12/09/2022, 5:56 PM

## 2022-12-09 NOTE — ED Notes (Signed)
ED TO INPATIENT HANDOFF REPORT  ED Nurse Name and Phone #: Loyda Costin [phone]  S Name/Age/Gender Stephen Gross 77 y.o. male Room/Bed: 024C/024C  Code Status   Code Status: Full Code  Home/SNF/Other Home Patient oriented to: self, place, time, and situation Is this baseline? Yes   Triage Complete: Triage complete  Chief Complaint SBO (small bowel obstruction) (HCC) [K56.609]  Triage Note PT states severe, generalized abdominal pain since last night.  States minimal nausea and heartburn and normal bowel movements.  States upper endoscopy/colonoscopy on August 21st.  Polyps removed with one being found cancerous.  Was told to notify GI doc on call when he arrives.   Allergies Allergies  Allergen Reactions   Lisinopril Cough   Nsaids     Bleeding internally    Level of Care/Admitting Diagnosis ED Disposition     ED Disposition  Admit   Condition  --   Comment  Hospital Area: [facility] [[id number]]  Level of Care: Telemetry Surgical [[id number]]  May place patient in observation at [facility] or [facility] if equivalent level of care is available:: No  Covid Evaluation: Asymptomatic - no recent exposure (last 10 days) testing not required  Diagnosis: SBO (small bowel obstruction) [facility]) [[id number]]  Admitting Physician: Synetta Fail [[id number]]  Attending Physician: Synetta Fail [[id number]]          B Medical/Surgery History Past Medical History:  Diagnosis Date   ANA positive    Arthritis    Blood clot in vein    in lung; unprevoked pulmonary embolisms in  2018 and 04/07/2018 managed on xarelto    Cancer [facility])    insulinoma   Diverticulosis    DVT (deep venous thrombosis) (HCC)    E coli bacteremia    Hypertension    Insulinoma    Membranous glomerulonephritis    Pancreatitis    Pulmonary embolism (HCC)    Pure hypercholesterolemia    Thyroid nodule    Past Surgical History:  Procedure Laterality Date   CARPAL TUNNEL RELEASE  Bilateral    CHOLECYSTECTOMY     INGUINAL HERNIA REPAIR Left 03/19/2019   Procedure: OPEN REPAIR OF LEFT INGUINAL HERNIA WITH MESH;  Surgeon: Gaynelle Adu, MD;  Location: [facility];  Service: General;  Laterality: Left;   PANCREATICODUODENECTOMY  2002   RENAL BIOPSY     TONSILLECTOMY     WHIPPLE PROCEDURE       A IV Location/Drains/Wounds Patient Lines/Drains/Airways Status     Active Line/Drains/Airways     Name Placement date Placement time Site Days   Peripheral IV 12/09/22 20 G Left Antecubital 12/09/22  1350  Antecubital  less than 1            Intake/Output Last 24 hours No intake or output data in the 24 hours ending 12/09/22 1818  Labs/Imaging Results for orders placed or performed during the hospital encounter of 12/09/22 (from the past 48 hour(s))  Urinalysis, Routine w reflex microscopic -Urine, Clean Catch     Status: Abnormal   Collection Time: 12/09/22 11:44 AM  Result Value Ref Range   Color, Urine YELLOW YELLOW   APPearance CLEAR CLEAR   Specific Gravity, Urine 1.020 1.005 - 1.030   pH 5.0 5.0 - 8.0   Glucose, UA NEGATIVE NEGATIVE mg/dL   Hgb urine dipstick NEGATIVE NEGATIVE   Bilirubin Urine NEGATIVE NEGATIVE   Ketones, ur 5 (A) NEGATIVE mg/dL   Protein, ur 884 (A) NEGATIVE mg/dL   Nitrite NEGATIVE NEGATIVE  Leukocytes,Ua NEGATIVE NEGATIVE   RBC / HPF 0-5 0 - 5 RBC/hpf   WBC, UA 0-5 0 - 5 WBC/hpf   Bacteria, UA NONE SEEN NONE SEEN   Squamous Epithelial / HPF 0-5 0 - 5 /HPF   Mucus PRESENT     Comment: Performed at [facility], [address], [place], [place] [address]  Lipase, blood     Status: None   Collection Time: 12/09/22 12:04 PM  Result Value Ref Range   Lipase 23 11 - 51 U/L    Comment: Performed at [facility], [address], [place], [place] [address]  Comprehensive metabolic panel     Status: Abnormal   Collection Time: 12/09/22 12:04 PM  Result Value Ref Range   Sodium 138 135 - 145 mmol/L   Potassium 4.1 3.5 - 5.1  mmol/L   Chloride 98 98 - 111 mmol/L   CO2 28 22 - 32 mmol/L   Glucose, Bld 121 (H) 70 - 99 mg/dL    Comment: Glucose reference range applies only to samples taken after fasting for at least 8 hours.   BUN 16 8 - 23 mg/dL   Creatinine, Ser 3.47 0.61 - 1.24 mg/dL   Calcium 42.5 8.9 - 95.6 mg/dL   Total Protein 6.0 (L) 6.5 - 8.1 g/dL   Albumin 3.6 3.5 - 5.0 g/dL   AST 22 15 - 41 U/L   ALT 23 0 - 44 U/L   Alkaline Phosphatase 43 38 - 126 U/L   Total Bilirubin 1.1 0.3 - 1.2 mg/dL   GFR, Estimated >38 >75 mL/min    Comment: (NOTE) Calculated using the CKD-EPI Creatinine Equation (2021)    Anion gap 12 5 - 15    Comment: Performed at [facility], [address], [place], [place] [address]  CBC     Status: Abnormal   Collection Time: 12/09/22 12:04 PM  Result Value Ref Range   WBC 10.8 (H) 4.0 - 10.5 K/uL   RBC 4.03 (L) 4.22 - 5.81 MIL/uL   Hemoglobin 12.9 (L) 13.0 - 17.0 g/dL   HCT 95.1 88.4 - 16.6 %   MCV 97.5 80.0 - 100.0 fL   MCH 32.0 26.0 - 34.0 pg   MCHC 32.8 30.0 - 36.0 g/dL   RDW 06.3 01.6 - 01.0 %   Platelets 380 150 - 400 K/uL   nRBC 0.0 0.0 - 0.2 %    Comment: Performed at [facility], [address] [place], [place] [address]   CT ABDOMEN PELVIS WO CONTRAST  Result Date: 12/09/2022 CLINICAL DATA:  Concern of bowel obstruction. Worsened abdominal pain for 2 days. Abdominal pain. Bowel obstruction suspected. Colonoscopy on 08/21. Remote Whipple procedure. * Tracking Code: BO * EXAM: CT ABDOMEN AND PELVIS WITHOUT CONTRAST TECHNIQUE: Multidetector CT imaging of the abdomen and pelvis was performed following the standard protocol without IV contrast. RADIATION DOSE REDUCTION: This exam was performed according to the departmental dose-optimization program which includes automated exposure control, adjustment of the mA and/or kV according to patient size and/or use of iterative reconstruction technique. COMPARISON:  Chest abdomen and pelvic CTs of 12/06/2022. Plain  film abdomen of earlier today FINDINGS: Lower chest: Emphysema. Normal heart size without pericardial or pleural effusion. Hepatobiliary: Pneumobilia.  Choledochojejunostomy. Pancreas: Normal pancreatic body and tail. Spleen: Normal in size, without focal abnormality. Adrenals/Urinary Tract: Normal adrenal glands. No renal calculi or hydronephrosis. No hydroureter or ureteric calculi. No bladder calculi. Median lobe impression  into the urinary bladder. Stomach/Bowel: Gastrojejunostomy. Extensive colonic diverticulosis. Normal terminal ileum and appendix. Mid small bowel loops dilated up to 3.7 cm and fluid-filled, new. Followed to the level of the mid ileum, with gradual transition to decompressed distal ileum. No obstructive mass identified. Vascular/Lymphatic: Aortic atherosclerosis. No abdominopelvic adenopathy. Reproductive: Mild prostatomegaly. Other: No pneumatosis or free intraperitoneal air. New small volume right pelvic fluid including on 73/3. No specific evidence of complicating bowel ischemia. Musculoskeletal: No acute osseous abnormality. IMPRESSION: 1. Partial small bowel obstruction to the level of the mid ileum, presumably secondary to adhesions. Secondary new small volume pelvic fluid without specific evidence of complicating ischemia. 2. Stable findings including prior Whipple procedure, as detailed on staging CT of 3 days ago. 3. Incidental findings, including: No Aortic Atherosclerosis (ICD10-I70.0). Prostatomegaly. Emphysema (ICD10-J43.9). Electronically Signed   By: Jeronimo Greaves M.D.   On: 12/09/2022 14:54   DG Abdomen Acute W/Chest  Result Date: 12/09/2022 CLINICAL DATA:  Abdominal pain. Recent upper endoscopy and colonoscopy. PT states severe, generalized abdominal pain since last night. States minimal nausea and heartburn and normal bowel movements. States upper endoscopy/colonoscopy on August 21st. Polyps removed with one being found cancerous. abdominal pain EXAM: DG ABDOMEN ACUTE WITH  1 VIEW CHEST COMPARISON:  CT 12/06/2022 FINDINGS: Lungs are clear. Normal mediastinum no free air beneath the hemidiaphragms Air-fluid levels within the small bowel. Gas in the descending colon. Stool in the rectum from recent CT exam. IMPRESSION: 1. Normal chest x-ray. 2. Small bowel ileus pattern versus early obstruction. Air-fluid levels without dilated bowel. 3. Normal colon with stool in rectum. Electronically Signed   By: Genevive Bi M.D.   On: 12/09/2022 13:15    Pending Labs Unresulted Labs (From admission, onward)     Start     Ordered   12/10/22 0500  Comprehensive metabolic panel  Tomorrow morning,   R        12/09/22 1802   12/10/22 0500  CBC  Tomorrow morning,   R        12/09/22 1802            Vitals/Pain Today's Vitals   12/09/22 1422 12/09/22 1430 12/09/22 1538 12/09/22 1730  BP:  (!) 144/77  135/84  Pulse:  (!) 59  63  Resp:  15  18  Temp:   97.9 F (36.6 C)   TempSrc:   Oral   SpO2:  93%  97%  Weight:      Height:      PainSc: 6        Isolation Precautions No active isolations  Medications Medications  sodium chloride flush (NS) 0.9 % injection 3 mL (has no administration in time range)  acetaminophen (TYLENOL) tablet 650 mg (has no administration in time range)    Or  acetaminophen (TYLENOL) suppository 650 mg (has no administration in time range)  HYDROmorphone (DILAUDID) injection 0.5-1 mg (has no administration in time range)  amLODipine (NORVASC) tablet 5 mg (has no administration in time range)  atorvastatin (LIPITOR) tablet 40 mg (has no administration in time range)  finasteride (PROSCAR) tablet 5 mg (has no administration in time range)  furosemide (LASIX) tablet 20 mg (has no administration in time range)  hydroxychloroquine (PLAQUENIL) tablet 400 mg (has no administration in time range)  losartan (COZAAR) tablet 100 mg (has no administration in time range)  pantoprazole (PROTONIX) EC tablet 40 mg (has no administration in time range)   predniSONE (DELTASONE) tablet 9 mg (has no administration in time  range)  fentaNYL (SUBLIMAZE) injection 50 mcg (50 mcg Intravenous Given 12/09/22 1352)    Mobility walks     Focused Assessments     R Recommendations: See Admitting Provider Note  Report given to:   Additional Notes:

## 2022-12-09 NOTE — Consult Note (Signed)
Consultation  Referring Provider:     Rolan Bucco, MD Primary Care Physician:  Street, Stephanie Coup, MD Primary Gastroenterologist:     Dr. Barron Alvine    Reason for Consultation:     Abdominal pain, concern for bowel obstruction         HPI:   Stephen Gross is a 77 y.o. male with a history of insulinoma status post Whipple resection at [facility] in 2002, unprovoked PE 2018 and 2019 (on Xarelto), HTN, HLD, glomerulonephritis, RA (on rituximab), and now recently diagnosed colon cancer, presenting with acute onset abdominal pain.  He was in his usual state of health yesterday.  Started developing abdominal pain around dinnertime.  Described as abdominal bloating, distention "like I ate Thanksgiving meal".  Had a BM after dinner.  Went to bed then awoke suddenly at 0200 with acute worsening abdominal pain.  No nausea/vomiting.  No prior similar symptoms.  Of note, recently underwent colonoscopy on 11/28/2022 which was notable for an unresectable polyp in the descending colon, with path showing adenocarcinoma.  He was referred to [facility], with appointment scheduled for 12/18/2022.  CEA was normal.  Underwent CT C/A/P on 8/29 for staging.  That had not yet been read prior to ER.  ER evaluation notable for the following: - WBC 10.8, H/H12.9/39.3, PLT 380 - Protein 6.0, otherwise normal CMP - Normal lipase - Abdominal x-ray with small bowel ileus pattern vs early obstruction - CT pending  Pain improved since getting fentanyl in the ER.  Otherwise he is well conversive and without any nausea/vomiting.  Did pass some flatus immediately prior to my evaluation.  Additionally, did have recent hospitalization at [facility] for E. coli bacteremia, hospitalized 5/9-11.  Was treated with IV Zosyn and transitioned to ciprofloxacin.  Abdominal imaging was unremarkable and had normal chromogranin A.  Subsequent MRI abdomen on 08/24/2022 as part of PC screening at [facility] with postsurgical changes of  Whipple, otherwise unremarkable study.   Endoscopic History: - 01/07/2018: EGD: 2 cm HH, healthy-appearing gastrojejunostomy, mild gastritis. - 01/07/2018: Colonoscopy: 2 small 2-4 mm transverse colon adenomas, diverticulosis, internal hemorrhoids - 11/28/2022: EGD: Whipple anatomy with healthy-appearing anastomosis.  Moderate non-H. pylori gastritis.  Normal esophagus and small bowel (path benign).  Was treated with high-dose PPI - 11/28/2022: Colonoscopy: 3 mm cecal adenoma, 8 mm cecal SSP.  2 polyps in the descending colon.  The 5 mm adenoma was removed with cold snare.  A 20 mm semisessile polyp did not appear endoscopically resectable.  Edges were biopsied and consistent with adenocarcinoma with possible lymphovascular invasion.  Tattoo placed on contralateral wall.  Sigmoid/transverse colon diverticulosis, internal hemorrhoids   Wife and cousin at bedside.  Past Medical History:  Diagnosis Date   ANA positive    Arthritis    Blood clot in vein    in lung; unprevoked pulmonary embolisms in  2018 and 04/07/2018 managed on xarelto    Cancer [facility])    insulinoma   Diverticulosis    DVT (deep venous thrombosis) (HCC)    E coli bacteremia    Hypertension    Insulinoma    Membranous glomerulonephritis    Pancreatitis    Pulmonary embolism (HCC)    Pure hypercholesterolemia    Thyroid nodule     Past Surgical History:  Procedure Laterality Date   CARPAL TUNNEL RELEASE Bilateral    CHOLECYSTECTOMY     INGUINAL HERNIA REPAIR Left 03/19/2019   Procedure: OPEN REPAIR OF LEFT INGUINAL HERNIA WITH MESH;  Surgeon: Gaynelle Adu, MD;  Location: [facility] ORS;  Service: General;  Laterality: Left;   PANCREATICODUODENECTOMY  2002   RENAL BIOPSY     TONSILLECTOMY     WHIPPLE PROCEDURE      Family History  Problem Relation Age of Onset   Heart disease Mother    Heart disease Father    Lung cancer Paternal Uncle    Colon cancer Neg Hx    Esophageal cancer Neg Hx     Social History    Tobacco Use   Smoking status: Former    Current packs/day: 0.00    Types: Cigarettes    Quit date: 1972    Years since quitting: 52.7   Smokeless tobacco: Never  Vaping Use   Vaping status: Never Used  Substance Use Topics   Alcohol use: Yes    Comment: Occ   Drug use: Never    Prior to Admission medications   Medication Sig Start Date End Date Taking? Authorizing Provider  acetaminophen (TYLENOL) 650 MG CR tablet Take 1,300 mg by mouth in the morning and at bedtime.    [provider]  amLODipine (NORVASC) 5 MG tablet Take 5 mg by mouth daily.    [provider]  atorvastatin (LIPITOR) 40 MG tablet Take 40 mg by mouth at bedtime.  12/06/17   [provider]  Calcium Carb-Cholecalciferol (CALCIUM 600 + D PO) Take 1 capsule by mouth daily.    [provider]  Cyanocobalamin (B-12) 1000 MCG TABS Take 1 tablet by mouth daily.    [provider]  cyclobenzaprine (FLEXERIL) 5 MG tablet Take 1 tablet (5 mg total) by mouth 2 (two) times daily as needed for muscle spasms. 05/25/22   Renne Crigler, PA-C  docusate sodium (COLACE) 100 MG capsule Take 200 mg by mouth at bedtime.    [provider]  enoxaparin (LOVENOX) 80 MG/0.8ML injection Inject 80 mg into the skin every 12 (twelve) hours. 11/21/22   [provider]  ferrous sulfate 325 (65 FE) MG tablet Take 325 mg by mouth daily with breakfast.    [provider]  finasteride (PROSCAR) 5 MG tablet Take 5 mg by mouth daily.    [provider]  furosemide (LASIX) 20 MG tablet Take 20 mg by mouth daily.    [provider]  gabapentin (NEURONTIN) 300 MG capsule Take 300 mg by mouth at bedtime.    [provider]  hydroxychloroquine (PLAQUENIL) 200 MG tablet Take 400 mg by mouth daily.    [provider]  lidocaine (LIDODERM) 5 % Place 1 patch onto the skin daily. Remove & Discard patch within 12 hours or as directed by MD 05/25/22   Renne Crigler, PA-C  losartan (COZAAR) 100 MG tablet Take 100 mg by mouth daily.    [provider]  Melatonin 5 MG CAPS Take 1 capsule by mouth daily.    [provider]  Na Sulfate-K Sulfate-Mg Sulf 17.5-3.13-1.6 GM/177ML SOLN SMARTSIG:1 Kit(s) By Mouth Once 10/10/22   [provider]  ondansetron (ZOFRAN-ODT) 4 MG disintegrating tablet Take 4 mg by mouth every 8 (eight) hours as needed. Patient not taking: Reported on 11/28/2022 08/15/22   [provider]  pantoprazole (PROTONIX) 40 MG tablet TAKE 1 TABLET(40 MG) BY MOUTH TWICE DAILY 11/29/22   Mister Krahenbuhl V, DO  predniSONE (DELTASONE) 10 MG tablet Take 10 mg by mouth daily with breakfast.    [provider]  tamsulosin (FLOMAX) 0.4 MG CAPS capsule Take  by mouth. Patient not taking: Reported on 11/28/2022 02/29/20   [provider]  XARELTO 20 MG TABS tablet Take 20 mg by mouth daily. 02/20/19   [provider]  zinc gluconate 50 MG tablet Take 50 mg by mouth daily.    [provider]    No current facility-administered medications for this encounter.   Current Outpatient Medications  Medication Sig Dispense Refill   acetaminophen (TYLENOL) 650 MG CR tablet Take 1,300 mg by mouth in the morning and at bedtime.     amLODipine (NORVASC) 5 MG tablet Take 5 mg by mouth daily.     atorvastatin (LIPITOR) 40 MG tablet Take 40 mg by mouth at bedtime.   11   Calcium Carb-Cholecalciferol (CALCIUM 600 + D PO) Take 1 capsule by mouth daily.     Cyanocobalamin (B-12) 1000 MCG TABS Take 1 tablet by mouth daily.     cyclobenzaprine (FLEXERIL) 5 MG tablet Take 1 tablet (5 mg total) by mouth 2 (two) times daily as needed for muscle spasms. 14 tablet 0   docusate sodium (COLACE) 100 MG capsule Take 200 mg by mouth at bedtime.     enoxaparin (LOVENOX) 80 MG/0.8ML injection Inject 80 mg into the skin every 12 (twelve) hours.     ferrous sulfate 325 (65 FE) MG tablet Take 325 mg by mouth daily  with breakfast.     finasteride (PROSCAR) 5 MG tablet Take 5 mg by mouth daily.     furosemide (LASIX) 20 MG tablet Take 20 mg by mouth daily.     gabapentin (NEURONTIN) 300 MG capsule Take 300 mg by mouth at bedtime.     hydroxychloroquine (PLAQUENIL) 200 MG tablet Take 400 mg by mouth daily.     lidocaine (LIDODERM) 5 % Place 1 patch onto the skin daily. Remove & Discard patch within 12 hours or as directed by MD 15 patch 0   losartan (COZAAR) 100 MG tablet Take 100 mg by mouth daily.     Melatonin 5 MG CAPS Take 1 capsule by mouth daily.     Na Sulfate-K Sulfate-Mg Sulf 17.5-3.13-1.6 GM/177ML SOLN SMARTSIG:1 Kit(s) By Mouth Once     ondansetron (ZOFRAN-ODT) 4 MG disintegrating tablet Take 4 mg by mouth every 8 (eight) hours as needed. (Patient not taking: Reported on 11/28/2022)     pantoprazole (PROTONIX) 40 MG tablet TAKE 1 TABLET(40 MG) BY MOUTH TWICE DAILY 180 tablet 0   predniSONE (DELTASONE) 10 MG tablet Take 10 mg by mouth daily with breakfast.     tamsulosin (FLOMAX) 0.4 MG CAPS capsule Take by mouth. (Patient not taking: Reported on 11/28/2022)     XARELTO 20 MG TABS tablet Take 20 mg by mouth daily.     zinc gluconate 50 MG tablet Take 50 mg by mouth daily.      Allergies as of 12/09/2022 - Review Complete 12/09/2022  Allergen Reaction Noted   Lisinopril Cough 03/11/2019   Nsaids  11/25/2017     Review of Systems:    As per HPI, otherwise negative    Physical Exam:  Vital signs in last 24 hours: Temp:  [98.2 F (36.8 C)] 98.2 F (36.8 C) (09/01 1133) Pulse Rate:  [59-70] 59 (09/01 1430) Resp:  [15-19] 15 (09/01 1430) BP: (137-148)/(77-86) 144/77 (09/01 1430) SpO2:  [93 %-100 %] 93 % (09/01 1430) Weight:  [88.5 kg] 88.5 kg (09/01 1141)   General:   Pleasant male in NAD.  Well conversive Head:  Normocephalic and atraumatic. Lungs:  Respirations even and unlabored. Lungs clear to auscultation bilaterally.   No wheezes, crackles, or rhonchi.  Heart:  Regular rate and  rhythm; no MRG Abdomen: Generalized TTP without peritoneal signs.  Abdomen soft, nondistended. + bowel sounds throughout.  Neurologic:  Alert and  oriented x4;  grossly normal neurologically. Skin:  Intact without significant lesions or rashes. Psych:  Alert and cooperative. Normal affect.  LAB RESULTS: Recent Labs    12/09/22 1204  WBC 10.8*  HGB 12.9*  HCT 39.3  PLT 380   BMET Recent Labs    12/09/22 1204  NA 138  K 4.1  CL 98  CO2 28  GLUCOSE 121*  BUN 16  CREATININE 0.83  CALCIUM 10.0   LFT Recent Labs    12/09/22 1204  PROT 6.0*  ALBUMIN 3.6  AST 22  ALT 23  ALKPHOS 43  BILITOT 1.1   PT/INR No results for input(s): "LABPROT", "INR" in the last 72 hours.  STUDIES: CT ABDOMEN PELVIS WO CONTRAST  Result Date: 12/09/2022 CLINICAL DATA:  Concern of bowel obstruction. Worsened abdominal pain for 2 days. Abdominal pain. Bowel obstruction suspected. Colonoscopy on 08/21. Remote Whipple procedure. * Tracking Code: BO * EXAM: CT ABDOMEN AND PELVIS WITHOUT CONTRAST TECHNIQUE: Multidetector CT imaging of the abdomen and pelvis was performed following the standard protocol without IV contrast. RADIATION DOSE REDUCTION: This exam was performed according to the departmental dose-optimization program which includes automated exposure control, adjustment of the mA and/or kV according to patient size and/or use of iterative reconstruction technique. COMPARISON:  Chest abdomen and pelvic CTs of 12/06/2022. Plain film abdomen of earlier today FINDINGS: Lower chest: Emphysema. Normal heart size without pericardial or pleural effusion. Hepatobiliary: Pneumobilia.  Choledochojejunostomy. Pancreas: Normal pancreatic body and tail. Spleen: Normal in size, without focal abnormality. Adrenals/Urinary Tract: Normal adrenal glands. No renal calculi or hydronephrosis. No hydroureter or ureteric calculi. No bladder calculi. Median lobe impression into the urinary bladder. Stomach/Bowel:  Gastrojejunostomy. Extensive colonic diverticulosis. Normal terminal ileum and appendix. Mid small bowel loops dilated up to 3.7 cm and fluid-filled, new. Followed to the level of the mid ileum, with gradual transition to decompressed distal ileum. No obstructive mass identified. Vascular/Lymphatic: Aortic atherosclerosis. No abdominopelvic adenopathy. Reproductive: Mild prostatomegaly. Other: No pneumatosis or free intraperitoneal air. New small volume right pelvic fluid including on 73/3. No specific evidence of complicating bowel ischemia. Musculoskeletal: No acute osseous abnormality. IMPRESSION: 1. Partial small bowel obstruction to the level of the mid ileum, presumably secondary to adhesions. Secondary new small volume pelvic fluid without specific evidence of complicating ischemia. 2. Stable findings including prior Whipple procedure, as detailed on staging CT of 3 days ago. 3. Incidental findings, including: No Aortic Atherosclerosis (ICD10-I70.0). Prostatomegaly. Emphysema (ICD10-J43.9). Electronically Signed   By: Jeronimo Greaves M.D.   On: 12/09/2022 14:54   DG Abdomen Acute W/Chest  Result Date: 12/09/2022 CLINICAL DATA:  Abdominal pain. Recent upper endoscopy and colonoscopy. PT states severe, generalized abdominal pain since last night. States minimal nausea and heartburn and normal bowel movements. States upper endoscopy/colonoscopy on August 21st. Polyps removed with one being found cancerous. abdominal pain EXAM: DG ABDOMEN ACUTE WITH 1 VIEW CHEST COMPARISON:  CT 12/06/2022 FINDINGS: Lungs are clear. Normal mediastinum no free air beneath the hemidiaphragms Air-fluid levels within the small bowel. Gas in the descending colon. Stool in the rectum from recent CT exam. IMPRESSION: 1. Normal chest x-ray. 2. Small bowel ileus pattern versus early obstruction. Air-fluid levels without dilated bowel. 3. Normal colon with  stool in rectum. Electronically Signed   By: Genevive Bi M.D.   On: 12/09/2022  13:15      Impression / Plan:   1) Small bowel obstruction 2) Abdominal pain 77 year old male presents with acute onset abdominal pain.  Abdominal x-ray suspicious for early SBO (vs ileus).  CT results reviewed after evaluating the patient and notable for fluid-filled mid small bowel loops dilated up to 3.7 cm.  There is gradual transition to a decompressed ileum.  Findings consistent with partial SBO to the mid ileum, presumably secondary to adhesions.  This is new compared with the CT performed on 12/06/2022. - Suspect will be able to manage with conservative measures - Pain control - Depending on course of symptoms, could require NGT to [facility] - Surgical consult - Did briefly discuss this SBO in context of his recently diagnosed colon cancer and need for surgical resection.  If he does in fact need surgery for this, would have in-depth conversation with the surgical team regarding his acute and malignant surgical needs  3) Colon cancer Colonoscopy 10 days ago with newly diagnosed colon cancer in the descending colon.  Reviewed CT C/A/P that was performed on 12/06/2022 and is without any evidence of metastatic disease, lymphadenopathy.  Of note, no obstructive pattern on that CT which serves as an excellent comparison study for today's ER evaluation. - Keep appointment at [facility] on 12/18/2022 as scheduled   Inpatient GI service will remain available as needed.  Please do not hesitate to contact us with additional questions or concerns.    LOS: 0 days   Shellia Cleverly  12/09/2022, 3:18 PM

## 2022-12-10 DIAGNOSIS — Z79899 Other long term (current) drug therapy: Secondary | ICD-10-CM | POA: Diagnosis not present

## 2022-12-10 DIAGNOSIS — Z90411 Acquired partial absence of pancreas: Secondary | ICD-10-CM | POA: Diagnosis not present

## 2022-12-10 DIAGNOSIS — R1084 Generalized abdominal pain: Secondary | ICD-10-CM | POA: Diagnosis not present

## 2022-12-10 DIAGNOSIS — K56609 Unspecified intestinal obstruction, unspecified as to partial versus complete obstruction: Secondary | ICD-10-CM | POA: Diagnosis present

## 2022-12-10 DIAGNOSIS — K8681 Exocrine pancreatic insufficiency: Secondary | ICD-10-CM | POA: Diagnosis present

## 2022-12-10 DIAGNOSIS — I10 Essential (primary) hypertension: Secondary | ICD-10-CM | POA: Diagnosis present

## 2022-12-10 DIAGNOSIS — Z87441 Personal history of nephrotic syndrome: Secondary | ICD-10-CM | POA: Diagnosis not present

## 2022-12-10 DIAGNOSIS — Z9049 Acquired absence of other specified parts of digestive tract: Secondary | ICD-10-CM | POA: Diagnosis not present

## 2022-12-10 DIAGNOSIS — Z86711 Personal history of pulmonary embolism: Secondary | ICD-10-CM | POA: Diagnosis not present

## 2022-12-10 DIAGNOSIS — E041 Nontoxic single thyroid nodule: Secondary | ICD-10-CM | POA: Diagnosis present

## 2022-12-10 DIAGNOSIS — M0600 Rheumatoid arthritis without rheumatoid factor, unspecified site: Secondary | ICD-10-CM | POA: Diagnosis present

## 2022-12-10 DIAGNOSIS — Z87891 Personal history of nicotine dependence: Secondary | ICD-10-CM | POA: Diagnosis not present

## 2022-12-10 DIAGNOSIS — C186 Malignant neoplasm of descending colon: Secondary | ICD-10-CM | POA: Diagnosis present

## 2022-12-10 DIAGNOSIS — E7800 Pure hypercholesterolemia, unspecified: Secondary | ICD-10-CM | POA: Diagnosis present

## 2022-12-10 DIAGNOSIS — Z86718 Personal history of other venous thrombosis and embolism: Secondary | ICD-10-CM | POA: Diagnosis not present

## 2022-12-10 DIAGNOSIS — Z7901 Long term (current) use of anticoagulants: Secondary | ICD-10-CM | POA: Diagnosis not present

## 2022-12-10 DIAGNOSIS — K219 Gastro-esophageal reflux disease without esophagitis: Secondary | ICD-10-CM | POA: Diagnosis present

## 2022-12-10 DIAGNOSIS — Z888 Allergy status to other drugs, medicaments and biological substances status: Secondary | ICD-10-CM | POA: Diagnosis not present

## 2022-12-10 DIAGNOSIS — Z7952 Long term (current) use of systemic steroids: Secondary | ICD-10-CM | POA: Diagnosis not present

## 2022-12-10 DIAGNOSIS — K56600 Partial intestinal obstruction, unspecified as to cause: Secondary | ICD-10-CM | POA: Diagnosis present

## 2022-12-10 DIAGNOSIS — N400 Enlarged prostate without lower urinary tract symptoms: Secondary | ICD-10-CM | POA: Diagnosis present

## 2022-12-10 DIAGNOSIS — K5669 Other partial intestinal obstruction: Secondary | ICD-10-CM | POA: Diagnosis not present

## 2022-12-10 LAB — CBC
HCT: 36.1 % — ABNORMAL LOW (ref 39.0–52.0)
Hemoglobin: 11.7 g/dL — ABNORMAL LOW (ref 13.0–17.0)
MCH: 31.4 pg (ref 26.0–34.0)
MCHC: 32.4 g/dL (ref 30.0–36.0)
MCV: 96.8 fL (ref 80.0–100.0)
Platelets: 315 10*3/uL (ref 150–400)
RBC: 3.73 MIL/uL — ABNORMAL LOW (ref 4.22–5.81)
RDW: 13.2 % (ref 11.5–15.5)
WBC: 7.7 10*3/uL (ref 4.0–10.5)
nRBC: 0 % (ref 0.0–0.2)

## 2022-12-10 LAB — COMPREHENSIVE METABOLIC PANEL
ALT: 26 U/L (ref 0–44)
AST: 25 U/L (ref 15–41)
Albumin: 2.9 g/dL — ABNORMAL LOW (ref 3.5–5.0)
Alkaline Phosphatase: 36 U/L — ABNORMAL LOW (ref 38–126)
Anion gap: 7 (ref 5–15)
BUN: 12 mg/dL (ref 8–23)
CO2: 25 mmol/L (ref 22–32)
Calcium: 8.8 mg/dL — ABNORMAL LOW (ref 8.9–10.3)
Chloride: 103 mmol/L (ref 98–111)
Creatinine, Ser: 0.83 mg/dL (ref 0.61–1.24)
GFR, Estimated: >60 mL/min (ref >60)
Glucose, Bld: 103 mg/dL — ABNORMAL HIGH (ref 70–99)
Potassium: 3.8 mmol/L (ref 3.5–5.1)
Sodium: 135 mmol/L (ref 135–145)
Total Bilirubin: 1 mg/dL (ref 0.3–1.2)
Total Protein: 4.9 g/dL — ABNORMAL LOW (ref 6.5–8.1)

## 2022-12-10 MED ORDER — ENOXAPARIN SODIUM 80 MG/0.8ML IJ SOSY
80.0000 mg | PREFILLED_SYRINGE | Freq: Two times a day (BID) | INTRAMUSCULAR | Status: DC
  Administered 2022-12-10 (×2): 80 mg via SUBCUTANEOUS
  Filled 2022-12-10 (×2): qty 0.8

## 2022-12-10 NOTE — Progress Notes (Signed)
PROGRESS NOTE    Stephen Gross  EVO:[id number] DOB: 1945/08/27 DOA: 12/09/2022 PCP: Street, Stephanie Coup, MD   Brief Narrative:  HPI: Stephen Gross is a 77 y.o. male with medical history significant of HTN, HLD, CKD, Nephrotic Syndrome, membranous glomerulonephritis, insulinoma status post Whipple, recurrent PE, diverticulosis, rheumatoid arthritis presenting with abdominal pain.   Patient had onset of abdominal pain around the time he dinner yesterday.  Had 1 bowel movement after dinner.  No bowel movement or flatus throughout the day today.  Had significant worsening of abdominal pain around 2 AM that has persisted until receiving pain medication in the ED.  Some increased GERD symptoms with nausea during the day but no vomiting.   Denies fever, chills, chest pain, shortness of breath.   Of note, recent colonoscopy on August 21 with a large polyp which was biopsied and came back for adenocarcinoma.  Has been referred to [facility] surgery but has not seen them yet for this.   ED Course: ital signs in the ED notable for blood pressure in the 130s to 140 systolic, heart rate in the 50s to 60s.  Lab workup included CMP with glucose 121, protein 6.0.  CBC with leukocytosis to 10.8, Hemoglobin stable at 12.9.  Lipase normal.  Urinalysis with ketones and proteins only.  X-ray of the chest and abdomen showed normal chest x-ray and small bowel ileus versus obstruction pattern.  CT of the abdomen pelvis showed partial SBO and status post Whipple status.  Received fentanyl in the ED.  GI and general surgery were consulted and general surgery is following.  Assessment & Plan:   Principal Problem:   SBO (small bowel obstruction) (HCC) Active Problems:   Generalized abdominal pain   Partial small bowel obstruction (HCC)   Essential (primary) hypertension   History of Whipple procedure   Long term (current) use of anticoagulants   Nephrotic syndrome with membranous glomerulonephritis   Recurrent pulmonary  embolism (HCC)   Pure hypercholesterolemia   Chronic kidney disease   Seronegative rheumatoid arthritis (HCC)  Small bowel obstruction Patient had bowel movement last night and is passing flatus with no abdominal pain.  SBO appears to be resolving.  He has been started on clear liquid diet and plan to advance diet later today.  Will observe overnight and discharge tomorrow if he continues to improve.   Recent abnormal colonoscopy > Recent colonoscopy on 8/21 as per HPI large polyp biopsied and positive for adenocarcinoma.  Has been referred to [facility] surgery but has not yet seen them. - Noted   Hypertension -Blood pressure controlled.  Continue home amlodipine, Lasix, losartan   Recurrent PE -Holding Eliquis but starting on full dose Lovenox.   History of nephrotic syndrome CKD > Creatinine stable. - Trend renal function and electrolytes   BPH - Continue finasteride   Seronegative rheumatoid arthritis - Continue home prednisone and Plaquenil   History of insulinoma > Status post Whipple as above. - Noted  DVT prophylaxis: SCDs Start: 12/09/22 1803   Code Status: Full Code  Family Communication:  None present at bedside.  Plan of care discussed with patient in length and he/she verbalized understanding and agreed with it.  Status is: Observation The patient will require care spanning > 2 midnights and should be moved to inpatient because: Resolving SBO, needs to advance diet and observe overnight.   Estimated body mass index is 25.73 kg/m as calculated from the following:   Height as of this encounter: 6\' 1"  (1.854 m).  Weight as of this encounter: 88.5 kg.    Nutritional Assessment: Body mass index is 25.73 kg/m.Marland Kitchen Seen by dietician.  I agree with the assessment and plan as outlined below: Nutrition Status:        . Skin Assessment: I have examined the patient's skin and I agree with the wound assessment as performed by the wound care RN as outlined below:     Consultants:  General surgery and GI  Procedures:  None  Antimicrobials:  Anti-infectives (From admission, onward)    Start     Dose/Rate Route Frequency Ordered Stop   12/09/22 2200  hydroxychloroquine (PLAQUENIL) tablet 400 mg        400 mg Oral Daily at bedtime 12/09/22 1807           Subjective: Patient seen and examined.  He feels well.  Had a large bowel movement.  No abdominal pain.  Objective: Vitals:   12/09/22 2137 12/10/22 0047 12/10/22 0552 12/10/22 0734  BP: 111/71 (!) 113/54 110/72 105/63  Pulse: 72 71 60 62  Resp: 18 18 16 16   Temp: 98.4 F (36.9 C) 98.7 F (37.1 C) 98.3 F (36.8 C) 98.4 F (36.9 C)  TempSrc: Oral Oral Oral Oral  SpO2: 94% 94% 94% 94%  Weight:      Height:       No intake or output data in the 24 hours ending 12/10/22 1126 Filed Weights   12/09/22 1141  Weight: 88.5 kg    Examination:  General exam: Appears calm and comfortable  Respiratory system: Clear to auscultation. Respiratory effort normal. Cardiovascular system: S1 & S2 heard, RRR. No JVD, murmurs, rubs, gallops or clicks. No pedal edema. Gastrointestinal system: Abdomen is nondistended, soft and nontender. No organomegaly or masses felt. Normal bowel sounds heard. Central nervous system: Alert and oriented. No focal neurological deficits. Extremities: Symmetric 5 x 5 power. Skin: No rashes, lesions or ulcers Psychiatry: Judgement and insight appear normal. Mood & affect appropriate.    Data Reviewed: I have personally reviewed following labs and imaging studies  CBC: Recent Labs  Lab 12/09/22 1204 12/10/22 0243  WBC 10.8* 7.7  HGB 12.9* 11.7*  HCT 39.3 36.1*  MCV 97.5 96.8  PLT 380 315   Basic Metabolic Panel: Recent Labs  Lab 12/09/22 1204 12/10/22 0243  NA 138 135  K 4.1 3.8  CL 98 103  CO2 28 25  GLUCOSE 121* 103*  BUN 16 12  CREATININE 0.83 0.83  CALCIUM 10.0 8.8*   GFR: Estimated Creatinine Clearance: 85.6 mL/min (by C-G formula based  on SCr of 0.83 mg/dL). Liver Function Tests: Recent Labs  Lab 12/09/22 1204 12/10/22 0243  AST 22 25  ALT 23 26  ALKPHOS 43 36*  BILITOT 1.1 1.0  PROT 6.0* 4.9*  ALBUMIN 3.6 2.9*   Recent Labs  Lab 12/09/22 1204  LIPASE 23   No results for input(s): "AMMONIA" in the last 168 hours. Coagulation Profile: No results for input(s): "INR", "PROTIME" in the last 168 hours. Cardiac Enzymes: No results for input(s): "CKTOTAL", "CKMB", "CKMBINDEX", "TROPONINI" in the last 168 hours. BNP (last 3 results) No results for input(s): "PROBNP" in the last 8760 hours. HbA1C: No results for input(s): "HGBA1C" in the last 72 hours. CBG: No results for input(s): "GLUCAP" in the last 168 hours. Lipid Profile: No results for input(s): "CHOL", "HDL", "LDLCALC", "TRIG", "CHOLHDL", "LDLDIRECT" in the last 72 hours. Thyroid Function Tests: No results for input(s): "TSH", "T4TOTAL", "FREET4", "T3FREE", "THYROIDAB" in the last  72 hours. Anemia Panel: No results for input(s): "VITAMINB12", "FOLATE", "FERRITIN", "TIBC", "IRON", "RETICCTPCT" in the last 72 hours. Sepsis Labs: No results for input(s): "PROCALCITON", "LATICACIDVEN" in the last 168 hours.  No results found for this or any previous visit (from the past 240 hour(s)).   Radiology Studies: CT ABDOMEN PELVIS WO CONTRAST  Result Date: 12/09/2022 CLINICAL DATA:  Concern of bowel obstruction. Worsened abdominal pain for 2 days. Abdominal pain. Bowel obstruction suspected. Colonoscopy on 08/21. Remote Whipple procedure. * Tracking Code: BO * EXAM: CT ABDOMEN AND PELVIS WITHOUT CONTRAST TECHNIQUE: Multidetector CT imaging of the abdomen and pelvis was performed following the standard protocol without IV contrast. RADIATION DOSE REDUCTION: This exam was performed according to the departmental dose-optimization program which includes automated exposure control, adjustment of the mA and/or kV according to patient size and/or use of iterative  reconstruction technique. COMPARISON:  Chest abdomen and pelvic CTs of 12/06/2022. Plain film abdomen of earlier today FINDINGS: Lower chest: Emphysema. Normal heart size without pericardial or pleural effusion. Hepatobiliary: Pneumobilia.  Choledochojejunostomy. Pancreas: Normal pancreatic body and tail. Spleen: Normal in size, without focal abnormality. Adrenals/Urinary Tract: Normal adrenal glands. No renal calculi or hydronephrosis. No hydroureter or ureteric calculi. No bladder calculi. Median lobe impression into the urinary bladder. Stomach/Bowel: Gastrojejunostomy. Extensive colonic diverticulosis. Normal terminal ileum and appendix. Mid small bowel loops dilated up to 3.7 cm and fluid-filled, new. Followed to the level of the mid ileum, with gradual transition to decompressed distal ileum. No obstructive mass identified. Vascular/Lymphatic: Aortic atherosclerosis. No abdominopelvic adenopathy. Reproductive: Mild prostatomegaly. Other: No pneumatosis or free intraperitoneal air. New small volume right pelvic fluid including on 73/3. No specific evidence of complicating bowel ischemia. Musculoskeletal: No acute osseous abnormality. IMPRESSION: 1. Partial small bowel obstruction to the level of the mid ileum, presumably secondary to adhesions. Secondary new small volume pelvic fluid without specific evidence of complicating ischemia. 2. Stable findings including prior Whipple procedure, as detailed on staging CT of 3 days ago. 3. Incidental findings, including: No Aortic Atherosclerosis (ICD10-I70.0). Prostatomegaly. Emphysema (ICD10-J43.9). Electronically Signed   By: Jeronimo Greaves M.D.   On: 12/09/2022 14:54   DG Abdomen Acute W/Chest  Result Date: 12/09/2022 CLINICAL DATA:  Abdominal pain. Recent upper endoscopy and colonoscopy. PT states severe, generalized abdominal pain since last night. States minimal nausea and heartburn and normal bowel movements. States upper endoscopy/colonoscopy on August 21st.  Polyps removed with one being found cancerous. abdominal pain EXAM: DG ABDOMEN ACUTE WITH 1 VIEW CHEST COMPARISON:  CT 12/06/2022 FINDINGS: Lungs are clear. Normal mediastinum no free air beneath the hemidiaphragms Air-fluid levels within the small bowel. Gas in the descending colon. Stool in the rectum from recent CT exam. IMPRESSION: 1. Normal chest x-ray. 2. Small bowel ileus pattern versus early obstruction. Air-fluid levels without dilated bowel. 3. Normal colon with stool in rectum. Electronically Signed   By: Genevive Bi M.D.   On: 12/09/2022 13:15    Scheduled Meds:  amLODipine  5 mg Oral QHS   atorvastatin  40 mg Oral QHS   enoxaparin (LOVENOX) injection  80 mg Subcutaneous Q12H   finasteride  5 mg Oral QHS   furosemide  20 mg Oral Daily   hydroxychloroquine  400 mg Oral QHS   losartan  100 mg Oral QHS   pantoprazole  40 mg Oral BID   predniSONE  9 mg Oral Q breakfast   sodium chloride flush  3 mL Intravenous Q12H   Continuous Infusions:   LOS: 0 days  Hughie Closs, MD [facility]  12/10/2022, 11:26 AM   *Please note that this is a verbal dictation therefore any spelling or grammatical errors are due to the "Dragon Medical One" system interpretation.  Please page via Amion and do not message via secure chat for urgent patient care matters. Secure chat can be used for non urgent patient care matters.  How to contact the [facility] Attending or Consulting provider 7A - 7P or covering provider during after hours 7P -7A, for this patient?  Check the care team in [facility] and look for a) attending/consulting TR[facility] provider listed and b) the [facility] team listed. Page or secure chat 7A-7P. Log into [email or web address] and use [facility] universal password to access. If you do not have the password, please contact the hospital operator. Locate the [facility] provider you are looking for under [facility] and page to a number that you can be directly reached. If you still have difficulty  reaching the provider, please page the [facility] (Director on Call) for the Hospitalists listed on amion for assistance.

## 2022-12-10 NOTE — Plan of Care (Signed)

## 2022-12-10 NOTE — Progress Notes (Signed)
ANTICOAGULATION CONSULT NOTE - Initial Consult  Pharmacy Consult for Lovenox Indication:  hx PE x2  Allergies  Allergen Reactions   Lisinopril Cough   Nsaids     Bleeding internally    Patient Measurements: Height: 6\' 1"  (185.4 cm) Weight: 88.5 kg (195 lb) IBW/kg (Calculated) : 79.9  Vital Signs: Temp: 98.4 F (36.9 C) (09/02 0734) Temp Source: Oral (09/02 0734) BP: 105/63 (09/02 0734) Pulse Rate: 62 (09/02 0734)  Labs: Recent Labs    12/09/22 1204 12/10/22 0243  HGB 12.9* 11.7*  HCT 39.3 36.1*  PLT 380 315  CREATININE 0.83 0.83    Estimated Creatinine Clearance: 85.6 mL/min (by C-G formula based on SCr of 0.83 mg/dL).   Medical History: Past Medical History:  Diagnosis Date   ANA positive    Arthritis    Blood clot in vein    in lung; unprevoked pulmonary embolisms in  2018 and 04/07/2018 managed on xarelto    Cancer [facility])    insulinoma   Diverticulosis    DVT (deep venous thrombosis) (HCC)    E coli bacteremia    Hypertension    Insulinoma    Membranous glomerulonephritis    Pancreatitis    Pulmonary embolism (HCC)    Pure hypercholesterolemia    Thyroid nodule     Medications:  Medications Prior to Admission  Medication Sig Dispense Refill Last Dose   acetaminophen (TYLENOL) 650 MG CR tablet Take 650 mg by mouth in the morning and at bedtime.   12/08/2022   amLODipine (NORVASC) 5 MG tablet Take 5 mg by mouth at bedtime.   12/08/2022   atorvastatin (LIPITOR) 40 MG tablet Take 40 mg by mouth at bedtime.   11 12/08/2022   Calcium Carb-Cholecalciferol (CALCIUM 600 + D PO) Take 1 capsule by mouth daily.   12/08/2022   Cyanocobalamin (B-12) 1000 MCG TABS Take 1 tablet by mouth daily.   12/08/2022   ferrous sulfate 325 (65 FE) MG tablet Take 325 mg by mouth daily with breakfast.   12/08/2022   finasteride (PROSCAR) 5 MG tablet Take 5 mg by mouth at bedtime.   12/08/2022   furosemide (LASIX) 20 MG tablet Take 20 mg by mouth daily.   12/08/2022    hydroxychloroquine (PLAQUENIL) 200 MG tablet Take 400 mg by mouth at bedtime.   12/08/2022   losartan (COZAAR) 100 MG tablet Take 100 mg by mouth at bedtime.   12/08/2022   Melatonin 5 MG CAPS Take 1 capsule by mouth daily.   12/08/2022   pantoprazole (PROTONIX) 40 MG tablet TAKE 1 TABLET(40 MG) BY MOUTH TWICE DAILY 180 tablet 0 12/08/2022   predniSONE (DELTASONE) 1 MG tablet Take 9 mg by mouth daily with breakfast.   12/08/2022   senna (SENOKOT) 8.6 MG tablet Take 2 tablets by mouth daily.   12/08/2022   XARELTO 20 MG TABS tablet Take 20 mg by mouth daily.   12/08/2022 at 0900   zinc gluconate 50 MG tablet Take 50 mg by mouth daily.   12/08/2022    Assessment: 77 yom with hx unprovoked PE x2 on Xarelto chronically admitted with severe abdominal pain and found to have pSBO. PMH includes newly diagnosed colon cancer, hx Whipple resection, RA (on rituximab), nephrotic syndrome, and membranous glomerulonephritis. Pharmacy consulted to begin Lovenox for hx PE until Xarelto can be resumed. Renal function is at baseline and normal. No bleeding noted, CBC is stable.  Goal of Therapy:  Full dose anticoagulation Monitor platelets by anticoagulation protocol: Yes  Plan:  Lovenox 80 mg SQ q12h CBC q72h while on Lovenox Monitor for s/sx of bleeding F/u ability to transition back to Xarelto  Thank you for involving pharmacy in this patient's care.  Loura Back, PharmD, BCPS Clinical Pharmacist Clinical phone for 12/10/2022 is [phone] 12/10/2022 10:02 AM

## 2022-12-10 NOTE — Progress Notes (Signed)
Assessment & Plan: HD#2 - partial SBO, hx of Whipple, colon neoplasm - CT scan reviewed - partial SBO - no nausea, emesis overnight - NG not required - having BM's, passing flatus - begin clear liquid diet this AM, advance as tolerated - home when diet tolerated - possibly later today or tomorrow - follow up at [facility] with surgical team for colon neoplasm per patient        [name], MD [facility] practice Office: [phone]        Chief Complaint: SBO  Subjective: Patient in bed, comfortable.  Denies nausea or emesis or pain. Having BM's, passing flatus.  Objective: Vital signs in last 24 hours: Temp:  [97.9 F (36.6 C)-98.7 F (37.1 C)] 98.4 F (36.9 C) (09/02 0734) Pulse Rate:  [59-72] 62 (09/02 0734) Resp:  [15-19] 16 (09/02 0734) BP: (105-148)/(54-86) 105/63 (09/02 0734) SpO2:  [93 %-100 %] 94 % (09/02 0734) Weight:  [88.5 kg] 88.5 kg (09/01 1141) Last BM Date : 12/09/22  Intake/Output from previous day: No intake/output data recorded. Intake/Output this shift: No intake/output data recorded.  Physical Exam: HEENT - sclerae clear, mucous membranes moist Neck - soft Abdomen - soft, non-tender without distension Ext - no edema, non-tender  Lab Results:  Recent Labs    12/09/22 1204 12/10/22 0243  WBC 10.8* 7.7  HGB 12.9* 11.7*  HCT 39.3 36.1*  PLT 380 315   BMET Recent Labs    12/09/22 1204 12/10/22 0243  NA 138 135  K 4.1 3.8  CL 98 103  CO2 28 25  GLUCOSE 121* 103*  BUN 16 12  CREATININE 0.83 0.83  CALCIUM 10.0 8.8*   PT/INR No results for input(s): "LABPROT", "INR" in the last 72 hours. Comprehensive Metabolic Panel:    Component Value Date/Time   NA 135 12/10/2022 0243   NA 138 12/09/2022 1204   K 3.8 12/10/2022 0243   K 4.1 12/09/2022 1204   CL 103 12/10/2022 0243   CL 98 12/09/2022 1204   CO2 25 12/10/2022 0243   CO2 28 12/09/2022 1204   BUN 12 12/10/2022 0243   BUN 16 12/09/2022 1204    CREATININE 0.83 12/10/2022 0243   CREATININE 0.83 12/09/2022 1204   GLUCOSE 103 (H) 12/10/2022 0243   GLUCOSE 121 (H) 12/09/2022 1204   CALCIUM 8.8 (L) 12/10/2022 0243   CALCIUM 10.0 12/09/2022 1204   AST 25 12/10/2022 0243   AST 22 12/09/2022 1204   ALT 26 12/10/2022 0243   ALT 23 12/09/2022 1204   ALKPHOS 36 (L) 12/10/2022 0243   ALKPHOS 43 12/09/2022 1204   BILITOT 1.0 12/10/2022 0243   BILITOT 1.1 12/09/2022 1204   PROT 4.9 (L) 12/10/2022 0243   PROT 6.0 (L) 12/09/2022 1204   ALBUMIN 2.9 (L) 12/10/2022 0243   ALBUMIN 3.6 12/09/2022 1204    Studies/Results: CT ABDOMEN PELVIS WO CONTRAST  Result Date: 12/09/2022 CLINICAL DATA:  Concern of bowel obstruction. Worsened abdominal pain for 2 days. Abdominal pain. Bowel obstruction suspected. Colonoscopy on 08/21. Remote Whipple procedure. * Tracking Code: BO * EXAM: CT ABDOMEN AND PELVIS WITHOUT CONTRAST TECHNIQUE: Multidetector CT imaging of the abdomen and pelvis was performed following the standard protocol without IV contrast. RADIATION DOSE REDUCTION: This exam was performed according to the departmental dose-optimization program which includes automated exposure control, adjustment of the mA and/or kV according to patient size and/or use of iterative reconstruction technique. COMPARISON:  Chest abdomen and pelvic CTs of 12/06/2022. Plain  film abdomen of earlier today FINDINGS: Lower chest: Emphysema. Normal heart size without pericardial or pleural effusion. Hepatobiliary: Pneumobilia.  Choledochojejunostomy. Pancreas: Normal pancreatic body and tail. Spleen: Normal in size, without focal abnormality. Adrenals/Urinary Tract: Normal adrenal glands. No renal calculi or hydronephrosis. No hydroureter or ureteric calculi. No bladder calculi. Median lobe impression into the urinary bladder. Stomach/Bowel: Gastrojejunostomy. Extensive colonic diverticulosis. Normal terminal ileum and appendix. Mid small bowel loops dilated up to 3.7 cm and  fluid-filled, new. Followed to the level of the mid ileum, with gradual transition to decompressed distal ileum. No obstructive mass identified. Vascular/Lymphatic: Aortic atherosclerosis. No abdominopelvic adenopathy. Reproductive: Mild prostatomegaly. Other: No pneumatosis or free intraperitoneal air. New small volume right pelvic fluid including on 73/3. No specific evidence of complicating bowel ischemia. Musculoskeletal: No acute osseous abnormality. IMPRESSION: 1. Partial small bowel obstruction to the level of the mid ileum, presumably secondary to adhesions. Secondary new small volume pelvic fluid without specific evidence of complicating ischemia. 2. Stable findings including prior Whipple procedure, as detailed on staging CT of 3 days ago. 3. Incidental findings, including: No Aortic Atherosclerosis (ICD10-I70.0). Prostatomegaly. Emphysema (ICD10-J43.9). Electronically Signed   By: Jeronimo Greaves M.D.   On: 12/09/2022 14:54   DG Abdomen Acute W/Chest  Result Date: 12/09/2022 CLINICAL DATA:  Abdominal pain. Recent upper endoscopy and colonoscopy. PT states severe, generalized abdominal pain since last night. States minimal nausea and heartburn and normal bowel movements. States upper endoscopy/colonoscopy on August 21st. Polyps removed with one being found cancerous. abdominal pain EXAM: DG ABDOMEN ACUTE WITH 1 VIEW CHEST COMPARISON:  CT 12/06/2022 FINDINGS: Lungs are clear. Normal mediastinum no free air beneath the hemidiaphragms Air-fluid levels within the small bowel. Gas in the descending colon. Stool in the rectum from recent CT exam. IMPRESSION: 1. Normal chest x-ray. 2. Small bowel ileus pattern versus early obstruction. Air-fluid levels without dilated bowel. 3. Normal colon with stool in rectum. Electronically Signed   By: Genevive Bi M.D.   On: 12/09/2022 13:15      [name] 12/10/2022  Patient ID: Stephen Gross, male   DOB: 1945-05-03, 50 y.o.   MRN: [id number]

## 2022-12-11 DIAGNOSIS — K56609 Unspecified intestinal obstruction, unspecified as to partial versus complete obstruction: Secondary | ICD-10-CM | POA: Diagnosis not present

## 2022-12-11 NOTE — Plan of Care (Signed)

## 2022-12-11 NOTE — Discharge Summary (Signed)
Physician Discharge Summary  Stephen Gross BJY:[id number] DOB: 10-11-1945 DOA: 12/09/2022  PCP: Bobbye Morton, MD  Admit date: 12/09/2022 Discharge date: 12/11/2022 30 Day Unplanned Readmission Risk Score    Flowsheet Row ED to Hosp-Admission (Current) from 12/09/2022 in [facility]  SURGICAL  30 Day Unplanned Readmission Risk Score (%) 11.46 Filed at 12/11/2022 0801       This score is the patient's risk of an unplanned readmission within 30 days of being discharged (0 -100%). The score is based on dignosis, age, lab data, medications, orders, and past utilization.   Low:  0-14.9   Medium: 15-21.9   High: 22-29.9   Extreme: 30 and above          Admitted From: Home Disposition: Home  Recommendations for Outpatient Follow-up:  Follow up with PCP in 1-2 weeks Please obtain BMP/CBC in one week Follow-up with GI as scheduled on 12/18/22 Please follow up with your PCP on the following pending results: Unresulted Labs (From admission, onward)     Start     Ordered   12/13/22 0500  CBC  Once,   R        12/10/22 1010              Home Health: None Equipment/Devices: None  Discharge Condition: Stable CODE STATUS: Full code Diet recommendation: Cardiac  Following HPI and ED course is copied from admitting hospitalist H&P. HPI: [name] is a 77 y.o. male with medical history significant of HTN, HLD, CKD, Nephrotic Syndrome, membranous glomerulonephritis, insulinoma status post Whipple, recurrent PE, diverticulosis, rheumatoid arthritis presenting with abdominal pain.   Patient had onset of abdominal pain around the time he dinner yesterday.  Had 1 bowel movement after dinner.  No bowel movement or flatus throughout the day today.  Had significant worsening of abdominal pain around 2 AM that has persisted until receiving pain medication in the ED.  Some increased GERD symptoms with nausea during the day but no vomiting.   Denies fever, chills,  chest pain, shortness of breath.   Of note, recent colonoscopy on August 21 with a large polyp which was biopsied and came back for adenocarcinoma.  Has been referred to [facility] surgery but has not seen them yet for this.   ED Course: ital signs in the ED notable for blood pressure in the 130s to 140 systolic, heart rate in the 50s to 60s.  Lab workup included CMP with glucose 121, protein 6.0.  CBC with leukocytosis to 10.8, Hemoglobin stable at 12.9.  Lipase normal.  Urinalysis with ketones and proteins only.  X-ray of the chest and abdomen showed normal chest x-ray and small bowel ileus versus obstruction pattern.  CT of the abdomen pelvis showed partial SBO and status post Whipple status.  Received fentanyl in the ED.  GI and general surgery were consulted and general surgery is following.  Subjective: Seen and examined.  Doing well.  Had another bowel movement yesterday.  No abdominal pain, nausea.  Tolerating soft diet.  Ready to go home.  Brief/Interim Summary: Details of hospitalization below.  Small bowel obstruction Treated conservatively, resolved.  Had several bowel movements.  Tolerating soft diet.  Cleared by general surgery.   Recent abnormal colonoscopy > Recent colonoscopy on 8/21 as per HPI large polyp biopsied and positive for adenocarcinoma.  Has been referred to [facility] surgery but has not yet seen them. - Noted   Hypertension -Blood pressure controlled.  Resume home medications.  Recurrent PE -Resume home medications.   History of nephrotic syndrome CKD > Creatinine stable. - Trend renal function and electrolytes   BPH - Continue finasteride   Seronegative rheumatoid arthritis - Continue home prednisone and Plaquenil   History of insulinoma > Status post Whipple as above. - Noted    Discharge plan was discussed with patient and/or family member and they verbalized understanding and agreed with it.  Discharge Diagnoses:  Principal Problem:   SBO (small bowel  obstruction) (HCC) Active Problems:   Generalized abdominal pain   Partial small bowel obstruction (HCC)   Essential (primary) hypertension   History of Whipple procedure   Long term (current) use of anticoagulants   Nephrotic syndrome with membranous glomerulonephritis   Recurrent pulmonary embolism (HCC)   Pure hypercholesterolemia   Chronic kidney disease   Seronegative rheumatoid arthritis (HCC)    Discharge Instructions   Allergies as of 12/11/2022       Reactions   Lisinopril Cough   Nsaids    Bleeding internally        Medication List     TAKE these medications    acetaminophen 650 MG CR tablet Commonly known as: TYLENOL Take 650 mg by mouth in the morning and at bedtime.   amLODipine 5 MG tablet Commonly known as: NORVASC Take 5 mg by mouth at bedtime.   atorvastatin 40 MG tablet Commonly known as: LIPITOR Take 40 mg by mouth at bedtime.   B-12 1000 MCG Tabs Take 1 tablet by mouth daily.   CALCIUM 600 + D PO Take 1 capsule by mouth daily.   ferrous sulfate 325 (65 FE) MG tablet Take 325 mg by mouth daily with breakfast.   finasteride 5 MG tablet Commonly known as: PROSCAR Take 5 mg by mouth at bedtime.   furosemide 20 MG tablet Commonly known as: LASIX Take 20 mg by mouth daily.   hydroxychloroquine 200 MG tablet Commonly known as: PLAQUENIL Take 400 mg by mouth at bedtime.   losartan 100 MG tablet Commonly known as: COZAAR Take 100 mg by mouth at bedtime.   Melatonin 5 MG Caps Take 1 capsule by mouth daily.   pantoprazole 40 MG tablet Commonly known as: PROTONIX TAKE 1 TABLET(40 MG) BY MOUTH TWICE DAILY   predniSONE 1 MG tablet Commonly known as: DELTASONE Take 9 mg by mouth daily with breakfast.   senna 8.6 MG tablet Commonly known as: SENOKOT Take 2 tablets by mouth daily.   Xarelto 20 MG Tabs tablet Generic drug: rivaroxaban Take 20 mg by mouth daily.   zinc gluconate 50 MG tablet Take 50 mg by mouth daily.         Follow-up Information     Street, Stephanie Coup, MD Follow up in 1 week(s).   Specialty: Family Medicine Contact information: [address] [place] [address] [phone]                Allergies  Allergen Reactions   Lisinopril Cough   Nsaids     Bleeding internally    Consultations: General surgery   Procedures/Studies: CT CHEST ABDOMEN PELVIS W CONTRAST  Result Date: 12/09/2022 CLINICAL DATA:  New diagnosis of colonic adenocarcinoma. Discovered within a descending colonic polyp. History of Whipple procedure in 2002. * Tracking Code: BO * EXAM: CT CHEST, ABDOMEN, AND PELVIS WITH CONTRAST TECHNIQUE: Multidetector CT imaging of the chest, abdomen and pelvis was performed following the standard protocol during bolus administration of intravenous contrast. RADIATION DOSE REDUCTION: This exam  was performed according to the departmental dose-optimization program which includes automated exposure control, adjustment of the mA and/or kV according to patient size and/or use of iterative reconstruction technique. CONTRAST:  OMNIPAQUE IOHEXOL 300 MG/ML  SOLN COMPARISON:  01/06/2019 abdom[facility]elvic CT from [facility]. [facility] chest 04/11/2018 from [facility] FINDINGS: CT CHEST FINDINGS Cardiovascular: Aortic atherosclerosis. Tortuous thoracic aorta. Normal heart size, without pericardial effusion. Lad coronary artery calcification. No central pulmonary embolism, on this non-dedicated study. Mediastinum/Nodes: No supraclavicular adenopathy. No mediastinal or hilar adenopathy. Lungs/Pleura: No pleural fluid.  Mild centrilobular emphysema. Bilateral lower lobe subpleural predominant pulmonary nodules including at up to 6 mm in the left lower lobe on 138/4 are similar to 04/11/2018 and considered benign. Musculoskeletal: No acute osseous abnormality. Presumed sebaceous cyst about the anterior right chest wall at 8 mm on 28/2. CT ABDOMEN PELVIS FINDINGS Hepatobiliary: No  suspicious liver lesion. Pneumobilia. Cholecystectomy and choledochojejunostomy. Pancreas: Normal pancreatic body and tail. Pancreatic head resection with pancreatic to jejunal anastomosis. Spleen: Normal in size, without focal abnormality. Adrenals/Urinary Tract: Normal adrenal glands. Normal kidneys, without hydronephrosis. Median lobe impression into the urinary bladder. Stomach/Bowel: Gastrojejunostomy. Extensive colonic diverticulosis. No residual descending colonic polyp/mass identified. Normal terminal ileum. Normal small bowel. Vascular/Lymphatic: Aortic atherosclerosis. No abdominopelvic adenopathy. Reproductive: Moderate prostatomegaly. Other: No significant free fluid. Small fat containing right inguinal hernia. No free intraperitoneal air. No evidence of omental or peritoneal disease. Musculoskeletal: Degenerative disc disease at the lumbosacral junction. IMPRESSION: 1. No colonic primary identified and no evidence of metastatic disease in the chest, abdomen, or pelvis. 2. Status post Whipple procedure without acute finding. 3. Incidental findings, including: Coronary artery atherosclerosis. Aortic Atherosclerosis (ICD10-I70.0). Emphysema (ICD10-J43.9). Prostatomegaly. Colonic diverticulosis. Small fat containing right inguinal hernia. Electronically Signed   By: Jeronimo Greaves M.D.   On: 12/09/2022 14:55   CT ABDOMEN PELVIS WO CONTRAST  Result Date: 12/09/2022 CLINICAL DATA:  Concern of bowel obstruction. Worsened abdominal pain for 2 days. Abdominal pain. Bowel obstruction suspected. Colonoscopy on 08/21. Remote Whipple procedure. * Tracking Code: BO * EXAM: CT ABDOMEN AND PELVIS WITHOUT CONTRAST TECHNIQUE: Multidetector CT imaging of the abdomen and pelvis was performed following the standard protocol without IV contrast. RADIATION DOSE REDUCTION: This exam was performed according to the departmental dose-optimization program which includes automated exposure control, adjustment of the mA and/or kV  according to patient size and/or use of iterative reconstruction technique. COMPARISON:  Chest abdomen and pelvic CTs of 12/06/2022. Plain film abdomen of earlier today FINDINGS: Lower chest: Emphysema. Normal heart size without pericardial or pleural effusion. Hepatobiliary: Pneumobilia.  Choledochojejunostomy. Pancreas: Normal pancreatic body and tail. Spleen: Normal in size, without focal abnormality. Adrenals/Urinary Tract: Normal adrenal glands. No renal calculi or hydronephrosis. No hydroureter or ureteric calculi. No bladder calculi. Median lobe impression into the urinary bladder. Stomach/Bowel: Gastrojejunostomy. Extensive colonic diverticulosis. Normal terminal ileum and appendix. Mid small bowel loops dilated up to 3.7 cm and fluid-filled, new. Followed to the level of the mid ileum, with gradual transition to decompressed distal ileum. No obstructive mass identified. Vascular/Lymphatic: Aortic atherosclerosis. No abdominopelvic adenopathy. Reproductive: Mild prostatomegaly. Other: No pneumatosis or free intraperitoneal air. New small volume right pelvic fluid including on 73/3. No specific evidence of complicating bowel ischemia. Musculoskeletal: No acute osseous abnormality. IMPRESSION: 1. Partial small bowel obstruction to the level of the mid ileum, presumably secondary to adhesions. Secondary new small volume pelvic fluid without specific evidence of complicating ischemia. 2. Stable findings including prior Whipple procedure, as detailed on staging CT of  3 days ago. 3. Incidental findings, including: No Aortic Atherosclerosis (ICD10-I70.0). Prostatomegaly. Emphysema (ICD10-J43.9). Electronically Signed   By: Jeronimo Greaves M.D.   On: 12/09/2022 14:54   DG Abdomen Acute W/Chest  Result Date: 12/09/2022 CLINICAL DATA:  Abdominal pain. Recent upper endoscopy and colonoscopy. PT states severe, generalized abdominal pain since last night. States minimal nausea and heartburn and normal bowel movements.  States upper endoscopy/colonoscopy on August 21st. Polyps removed with one being found cancerous. abdominal pain EXAM: DG ABDOMEN ACUTE WITH 1 VIEW CHEST COMPARISON:  CT 12/06/2022 FINDINGS: Lungs are clear. Normal mediastinum no free air beneath the hemidiaphragms Air-fluid levels within the small bowel. Gas in the descending colon. Stool in the rectum from recent CT exam. IMPRESSION: 1. Normal chest x-ray. 2. Small bowel ileus pattern versus early obstruction. Air-fluid levels without dilated bowel. 3. Normal colon with stool in rectum. Electronically Signed   By: Genevive Bi M.D.   On: 12/09/2022 13:15     Discharge Exam: Vitals:   12/11/22 0624 12/11/22 0756  BP: 108/72 115/76  Pulse: (!) 59 (!) 58  Resp: 16 17  Temp: 97.8 F (36.6 C) (!) 97.4 F (36.3 C)  SpO2: 97% 95%   Vitals:   12/10/22 1546 12/10/22 2036 12/11/22 0624 12/11/22 0756  BP: 116/75 106/65 108/72 115/76  Pulse: 62 62 (!) 59 (!) 58  Resp: 16 18 16 17   Temp: 98.2 F (36.8 C) 98 F (36.7 C) 97.8 F (36.6 C) (!) 97.4 F (36.3 C)  TempSrc: Oral Oral Oral Oral  SpO2: 94% 97% 97% 95%  Weight:      Height:        General: Pt is alert, awake, not in acute distress Cardiovascular: RRR, S1/S2 +, no rubs, no gallops Respiratory: CTA bilaterally, no wheezing, no rhonchi Abdominal: Soft, NT, ND, bowel sounds + Extremities: no edema, no cyanosis    The results of significant diagnostics from this hospitalization (including imaging, microbiology, ancillary and laboratory) are listed below for reference.     Microbiology: No results found for this or any previous visit (from the past 240 hour(s)).   Labs: BNP (last 3 results) No results for input(s): "BNP" in the last 8760 hours. Basic Metabolic Panel: Recent Labs  Lab 12/09/22 1204 12/10/22 0243  NA 138 135  K 4.1 3.8  CL 98 103  CO2 28 25  GLUCOSE 121* 103*  BUN 16 12  CREATININE 0.83 0.83  CALCIUM 10.0 8.8*   Liver Function Tests: Recent Labs   Lab 12/09/22 1204 12/10/22 0243  AST 22 25  ALT 23 26  ALKPHOS 43 36*  BILITOT 1.1 1.0  PROT 6.0* 4.9*  ALBUMIN 3.6 2.9*   Recent Labs  Lab 12/09/22 1204  LIPASE 23   No results for input(s): "AMMONIA" in the last 168 hours. CBC: Recent Labs  Lab 12/09/22 1204 12/10/22 0243  WBC 10.8* 7.7  HGB 12.9* 11.7*  HCT 39.3 36.1*  MCV 97.5 96.8  PLT 380 315   Cardiac Enzymes: No results for input(s): "CKTOTAL", "CKMB", "CKMBINDEX", "TROPONINI" in the last 168 hours. BNP: Invalid input(s): "POCBNP" CBG: No results for input(s): "GLUCAP" in the last 168 hours. D-Dimer No results for input(s): "DDIMER" in the last 72 hours. Hgb A1c No results for input(s): "HGBA1C" in the last 72 hours. Lipid Profile No results for input(s): "CHOL", "HDL", "LDLCALC", "TRIG", "CHOLHDL", "LDLDIRECT" in the last 72 hours. Thyroid function studies No results for input(s): "TSH", "T4TOTAL", "T3FREE", "THYROIDAB" in the last 72 hours.  Invalid input(s): "FREET3" Anemia work up No results for input(s): "VITAMINB12", "FOLATE", "FERRITIN", "TIBC", "IRON", "RETICCTPCT" in the last 72 hours. Urinalysis    Component Value Date/Time   COLORURINE YELLOW 12/09/2022 1144   APPEARANCEUR CLEAR 12/09/2022 1144   LABSPEC 1.020 12/09/2022 1144   PHURINE 5.0 12/09/2022 1144   GLUCOSEU NEGATIVE 12/09/2022 1144   HGBUR NEGATIVE 12/09/2022 1144   BILIRUBINUR NEGATIVE 12/09/2022 1144   KETONESUR 5 (A) 12/09/2022 1144   PROTEINUR 100 (A) 12/09/2022 1144   NITRITE NEGATIVE 12/09/2022 1144   LEUKOCYTESUR NEGATIVE 12/09/2022 1144   Sepsis Labs Recent Labs  Lab 12/09/22 1204 12/10/22 0243  WBC 10.8* 7.7   Microbiology No results found for this or any previous visit (from the past 240 hour(s)).  FURTHER DISCHARGE INSTRUCTIONS:   Get Medicines reviewed and adjusted: Please take all your medications with you for your next visit with your Primary MD   Laboratory/radiological data: Please request your  Primary MD to go over all hospital tests and procedure/radiological results at the follow up, please ask your Primary MD to get all Hospital records sent to his/her office.   In some cases, they will be blood work, cultures and biopsy results pending at the time of your discharge. Please request that your primary care M.D. goes through all the records of your hospital data and follows up on these results.   Also Note the following: If you experience worsening of your admission symptoms, develop shortness of breath, life threatening emergency, suicidal or homicidal thoughts you must seek medical attention immediately by calling 911 or calling your MD immediately  if symptoms less severe.   You must read complete instructions/literature along with all the possible adverse reactions/side effects for all the Medicines you take and that have been prescribed to you. Take any new Medicines after you have completely understood and accpet all the possible adverse reactions/side effects.    Do not drive when taking Pain medications or sleeping medications (Benzodaizepines)   Do not take more than prescribed Pain, Sleep and Anxiety Medications. It is not advisable to combine anxiety,sleep and pain medications without talking with your primary care practitioner   Special Instructions: If you have smoked or chewed Tobacco  in the last 2 yrs please stop smoking, stop any regular Alcohol  and or any Recreational drug use.   Wear Seat belts while driving.   Please note: You were cared for by a hospitalist during your hospital stay. Once you are discharged, your primary care physician will handle any further medical issues. Please note that NO REFILLS for any discharge medications will be authorized once you are discharged, as it is imperative that you return to your primary care physician (or establish a relationship with a primary care physician if you do not have one) for your post hospital discharge needs so  that they can reassess your need for medications and monitor your lab values  Time coordinating discharge: Over 30 minutes  SIGNED:   Hughie Closs, MD  [facility] 12/11/2022, 9:33 AM *Please note that this is a verbal dictation therefore any spelling or grammatical errors are due to the "[employer]agon [employer] One" system interpretation. If 7PM-7AM, please contact night-coverage [email or web address]

## 2022-12-11 NOTE — Progress Notes (Signed)
Subjective: Tolerating soft diet with no issues.  No nausea/vomiting.  + BM/flatus  ROS: See above, otherwise other systems negative  Objective: Vital signs in last 24 hours: Temp:  [97.4 F (36.3 C)-98.2 F (36.8 C)] 97.4 F (36.3 C) (09/03 0756) Pulse Rate:  [58-62] 58 (09/03 0756) Resp:  [16-18] 17 (09/03 0756) BP: (106-116)/(65-76) 115/76 (09/03 0756) SpO2:  [94 %-97 %] 95 % (09/03 0756) Last BM Date : 12/10/22  Intake/Output from previous day: 09/02 0701 - 09/03 0700 In: 240 [P.O.:240] Out: -  Intake/Output this shift: No intake/output data recorded.  PE: Abd: soft, NT, ND, +BS  Lab Results:  Recent Labs    12/09/22 1204 12/10/22 0243  WBC 10.8* 7.7  HGB 12.9* 11.7*  HCT 39.3 36.1*  PLT 380 315   BMET Recent Labs    12/09/22 1204 12/10/22 0243  NA 138 135  K 4.1 3.8  CL 98 103  CO2 28 25  GLUCOSE 121* 103*  BUN 16 12  CREATININE 0.83 0.83  CALCIUM 10.0 8.8*   PT/INR No results for input(s): "LABPROT", "INR" in the last 72 hours. CMP     Component Value Date/Time   NA 135 12/10/2022 0243   K 3.8 12/10/2022 0243   CL 103 12/10/2022 0243   CO2 25 12/10/2022 0243   GLUCOSE 103 (H) 12/10/2022 0243   BUN 12 12/10/2022 0243   CREATININE 0.83 12/10/2022 0243   CALCIUM 8.8 (L) 12/10/2022 0243   PROT 4.9 (L) 12/10/2022 0243   ALBUMIN 2.9 (L) 12/10/2022 0243   AST 25 12/10/2022 0243   ALT 26 12/10/2022 0243   ALKPHOS 36 (L) 12/10/2022 0243   BILITOT 1.0 12/10/2022 0243   GFRNONAA >60 12/10/2022 0243   GFRAA >60 03/16/2019 1443   Lipase     Component Value Date/Time   LIPASE 23 12/09/2022 1204       Studies/Results: CT ABDOMEN PELVIS WO CONTRAST  Result Date: 12/09/2022 CLINICAL DATA:  Concern of bowel obstruction. Worsened abdominal pain for 2 days. Abdominal pain. Bowel obstruction suspected. Colonoscopy on 08/21. Remote Whipple procedure. * Tracking Code: BO * EXAM: CT ABDOMEN AND PELVIS WITHOUT CONTRAST TECHNIQUE:  Multidetector CT imaging of the abdomen and pelvis was performed following the standard protocol without IV contrast. RADIATION DOSE REDUCTION: This exam was performed according to the departmental dose-optimization program which includes automated exposure control, adjustment of the mA and/or kV according to patient size and/or use of iterative reconstruction technique. COMPARISON:  Chest abdomen and pelvic CTs of 12/06/2022. Plain film abdomen of earlier today FINDINGS: Lower chest: Emphysema. Normal heart size without pericardial or pleural effusion. Hepatobiliary: Pneumobilia.  Choledochojejunostomy. Pancreas: Normal pancreatic body and tail. Spleen: Normal in size, without focal abnormality. Adrenals/Urinary Tract: Normal adrenal glands. No renal calculi or hydronephrosis. No hydroureter or ureteric calculi. No bladder calculi. Median lobe impression into the urinary bladder. Stomach/Bowel: Gastrojejunostomy. Extensive colonic diverticulosis. Normal terminal ileum and appendix. Mid small bowel loops dilated up to 3.7 cm and fluid-filled, new. Followed to the level of the mid ileum, with gradual transition to decompressed distal ileum. No obstructive mass identified. Vascular/Lymphatic: Aortic atherosclerosis. No abdominopelvic adenopathy. Reproductive: Mild prostatomegaly. Other: No pneumatosis or free intraperitoneal air. New small volume right pelvic fluid including on 73/3. No specific evidence of complicating bowel ischemia. Musculoskeletal: No acute osseous abnormality. IMPRESSION: 1. Partial small bowel obstruction to the level of the mid ileum, presumably secondary to adhesions. Secondary new small volume pelvic fluid without specific  evidence of complicating ischemia. 2. Stable findings including prior Whipple procedure, as detailed on staging CT of 3 days ago. 3. Incidental findings, including: No Aortic Atherosclerosis (ICD10-I70.0). Prostatomegaly. Emphysema (ICD10-J43.9). Electronically Signed   By:  Jeronimo Greaves M.D.   On: 12/09/2022 14:54   DG Abdomen Acute W/Chest  Result Date: 12/09/2022 CLINICAL DATA:  Abdominal pain. Recent upper endoscopy and colonoscopy. PT states severe, generalized abdominal pain since last night. States minimal nausea and heartburn and normal bowel movements. States upper endoscopy/colonoscopy on August 21st. Polyps removed with one being found cancerous. abdominal pain EXAM: DG ABDOMEN ACUTE WITH 1 VIEW CHEST COMPARISON:  CT 12/06/2022 FINDINGS: Lungs are clear. Normal mediastinum no free air beneath the hemidiaphragms Air-fluid levels within the small bowel. Gas in the descending colon. Stool in the rectum from recent CT exam. IMPRESSION: 1. Normal chest x-ray. 2. Small bowel ileus pattern versus early obstruction. Air-fluid levels without dilated bowel. 3. Normal colon with stool in rectum. Electronically Signed   By: Genevive Bi M.D.   On: 12/09/2022 13:15    Anti-infectives: Anti-infectives (From admission, onward)    Start     Dose/Rate Route Frequency Ordered Stop   12/09/22 2200  hydroxychloroquine (PLAQUENIL) tablet 400 mg        400 mg Oral Daily at bedtime 12/09/22 1807          Assessment/Plan SBO -improving, tolerating soft diet with  no issues  -surgically stable for DC home -follow up with [facility] for colonic neoplasm  FEN - soft diet VTE - Lovenox ID - none needed  Colonic neoplasm H/o whipple HTN PE  I reviewed hospitalist notes, last 24 h vitals and pain scores, last 48 h intake and output, last 24 h labs and trends, and last 24 h imaging results.   LOS: 1 day    Letha Cape , [facility] 12/11/2022, 8:25 AM Please see Amion for pager number during day hours 7:00am-4:30pm or 7:00am -11:30am on weekends

## 2022-12-11 NOTE — Progress Notes (Signed)
Pt does not have a legal guardian  °

## 2022-12-18 ENCOUNTER — Ambulatory Visit: Payer: BLUE CROSS/BLUE SHIELD | Admitting: Gastroenterology

## 2022-12-18 DIAGNOSIS — C189 Malignant neoplasm of colon, unspecified: Secondary | ICD-10-CM | POA: Diagnosis not present

## 2022-12-18 DIAGNOSIS — D137 Benign neoplasm of endocrine pancreas: Secondary | ICD-10-CM | POA: Diagnosis not present

## 2022-12-18 DIAGNOSIS — D124 Benign neoplasm of descending colon: Secondary | ICD-10-CM | POA: Diagnosis not present

## 2022-12-28 DIAGNOSIS — E7800 Pure hypercholesterolemia, unspecified: Secondary | ICD-10-CM | POA: Diagnosis not present

## 2022-12-28 DIAGNOSIS — I129 Hypertensive chronic kidney disease with stage 1 through stage 4 chronic kidney disease, or unspecified chronic kidney disease: Secondary | ICD-10-CM | POA: Diagnosis not present

## 2022-12-28 DIAGNOSIS — D137 Benign neoplasm of endocrine pancreas: Secondary | ICD-10-CM | POA: Diagnosis not present

## 2022-12-28 DIAGNOSIS — N042 Nephrotic syndrome with diffuse membranous glomerulonephritis, unspecified: Secondary | ICD-10-CM | POA: Diagnosis not present

## 2022-12-28 DIAGNOSIS — I2699 Other pulmonary embolism without acute cor pulmonale: Secondary | ICD-10-CM | POA: Diagnosis not present

## 2022-12-28 DIAGNOSIS — I10 Essential (primary) hypertension: Secondary | ICD-10-CM | POA: Diagnosis not present

## 2022-12-28 DIAGNOSIS — Z01818 Encounter for other preprocedural examination: Secondary | ICD-10-CM | POA: Diagnosis not present

## 2022-12-28 DIAGNOSIS — C189 Malignant neoplasm of colon, unspecified: Secondary | ICD-10-CM | POA: Diagnosis not present

## 2022-12-28 DIAGNOSIS — N189 Chronic kidney disease, unspecified: Secondary | ICD-10-CM | POA: Diagnosis not present

## 2022-12-28 DIAGNOSIS — K219 Gastro-esophageal reflux disease without esophagitis: Secondary | ICD-10-CM | POA: Diagnosis not present

## 2022-12-28 DIAGNOSIS — Z90410 Acquired total absence of pancreas: Secondary | ICD-10-CM | POA: Diagnosis not present

## 2022-12-28 DIAGNOSIS — Z9049 Acquired absence of other specified parts of digestive tract: Secondary | ICD-10-CM | POA: Diagnosis not present

## 2022-12-28 DIAGNOSIS — M0600 Rheumatoid arthritis without rheumatoid factor, unspecified site: Secondary | ICD-10-CM | POA: Diagnosis not present

## 2022-12-28 DIAGNOSIS — D509 Iron deficiency anemia, unspecified: Secondary | ICD-10-CM | POA: Diagnosis not present

## 2022-12-31 DIAGNOSIS — Z79899 Other long term (current) drug therapy: Secondary | ICD-10-CM | POA: Diagnosis not present

## 2022-12-31 DIAGNOSIS — K635 Polyp of colon: Secondary | ICD-10-CM | POA: Diagnosis not present

## 2022-12-31 DIAGNOSIS — I129 Hypertensive chronic kidney disease with stage 1 through stage 4 chronic kidney disease, or unspecified chronic kidney disease: Secondary | ICD-10-CM | POA: Diagnosis not present

## 2022-12-31 DIAGNOSIS — Z8616 Personal history of COVID-19: Secondary | ICD-10-CM | POA: Diagnosis not present

## 2022-12-31 DIAGNOSIS — Z87891 Personal history of nicotine dependence: Secondary | ICD-10-CM | POA: Diagnosis not present

## 2022-12-31 DIAGNOSIS — D509 Iron deficiency anemia, unspecified: Secondary | ICD-10-CM | POA: Diagnosis not present

## 2022-12-31 DIAGNOSIS — Z9049 Acquired absence of other specified parts of digestive tract: Secondary | ICD-10-CM | POA: Diagnosis not present

## 2022-12-31 DIAGNOSIS — D490 Neoplasm of unspecified behavior of digestive system: Secondary | ICD-10-CM | POA: Diagnosis not present

## 2022-12-31 DIAGNOSIS — K219 Gastro-esophageal reflux disease without esophagitis: Secondary | ICD-10-CM | POA: Diagnosis not present

## 2022-12-31 DIAGNOSIS — Z7901 Long term (current) use of anticoagulants: Secondary | ICD-10-CM | POA: Diagnosis not present

## 2022-12-31 DIAGNOSIS — D631 Anemia in chronic kidney disease: Secondary | ICD-10-CM | POA: Diagnosis not present

## 2022-12-31 DIAGNOSIS — E7800 Pure hypercholesterolemia, unspecified: Secondary | ICD-10-CM | POA: Diagnosis not present

## 2022-12-31 DIAGNOSIS — N189 Chronic kidney disease, unspecified: Secondary | ICD-10-CM | POA: Diagnosis not present

## 2022-12-31 DIAGNOSIS — M0600 Rheumatoid arthritis without rheumatoid factor, unspecified site: Secondary | ICD-10-CM | POA: Diagnosis not present

## 2022-12-31 DIAGNOSIS — Z86711 Personal history of pulmonary embolism: Secondary | ICD-10-CM | POA: Diagnosis not present

## 2022-12-31 DIAGNOSIS — Z8719 Personal history of other diseases of the digestive system: Secondary | ICD-10-CM | POA: Diagnosis not present

## 2023-01-03 DIAGNOSIS — D500 Iron deficiency anemia secondary to blood loss (chronic): Secondary | ICD-10-CM | POA: Diagnosis not present

## 2023-01-03 DIAGNOSIS — G4700 Insomnia, unspecified: Secondary | ICD-10-CM | POA: Diagnosis not present

## 2023-01-03 DIAGNOSIS — C186 Malignant neoplasm of descending colon: Secondary | ICD-10-CM | POA: Diagnosis not present

## 2023-01-03 DIAGNOSIS — K922 Gastrointestinal hemorrhage, unspecified: Secondary | ICD-10-CM | POA: Diagnosis not present

## 2023-01-08 DIAGNOSIS — D124 Benign neoplasm of descending colon: Secondary | ICD-10-CM | POA: Diagnosis not present

## 2023-01-08 DIAGNOSIS — C189 Malignant neoplasm of colon, unspecified: Secondary | ICD-10-CM | POA: Diagnosis not present

## 2023-01-10 ENCOUNTER — Encounter: Payer: Self-pay | Admitting: Gastroenterology

## 2023-01-15 DIAGNOSIS — C189 Malignant neoplasm of colon, unspecified: Secondary | ICD-10-CM | POA: Diagnosis not present

## 2023-01-15 DIAGNOSIS — I10 Essential (primary) hypertension: Secondary | ICD-10-CM | POA: Diagnosis not present

## 2023-01-15 DIAGNOSIS — N401 Enlarged prostate with lower urinary tract symptoms: Secondary | ICD-10-CM | POA: Diagnosis not present

## 2023-01-15 DIAGNOSIS — D137 Benign neoplasm of endocrine pancreas: Secondary | ICD-10-CM | POA: Diagnosis not present

## 2023-01-15 DIAGNOSIS — M0600 Rheumatoid arthritis without rheumatoid factor, unspecified site: Secondary | ICD-10-CM | POA: Diagnosis not present

## 2023-01-15 DIAGNOSIS — N042 Nephrotic syndrome with diffuse membranous glomerulonephritis, unspecified: Secondary | ICD-10-CM | POA: Diagnosis not present

## 2023-01-15 DIAGNOSIS — D509 Iron deficiency anemia, unspecified: Secondary | ICD-10-CM | POA: Diagnosis not present

## 2023-01-15 DIAGNOSIS — K219 Gastro-esophageal reflux disease without esophagitis: Secondary | ICD-10-CM | POA: Diagnosis not present

## 2023-01-15 DIAGNOSIS — R918 Other nonspecific abnormal finding of lung field: Secondary | ICD-10-CM | POA: Diagnosis not present

## 2023-01-15 DIAGNOSIS — N189 Chronic kidney disease, unspecified: Secondary | ICD-10-CM | POA: Diagnosis not present

## 2023-01-15 DIAGNOSIS — Z9049 Acquired absence of other specified parts of digestive tract: Secondary | ICD-10-CM | POA: Diagnosis not present

## 2023-01-15 DIAGNOSIS — R768 Other specified abnormal immunological findings in serum: Secondary | ICD-10-CM | POA: Diagnosis not present

## 2023-01-15 DIAGNOSIS — Z7901 Long term (current) use of anticoagulants: Secondary | ICD-10-CM | POA: Diagnosis not present

## 2023-01-15 DIAGNOSIS — Z86711 Personal history of pulmonary embolism: Secondary | ICD-10-CM | POA: Diagnosis not present

## 2023-01-15 DIAGNOSIS — Z01818 Encounter for other preprocedural examination: Secondary | ICD-10-CM | POA: Diagnosis not present

## 2023-01-15 DIAGNOSIS — Z90410 Acquired total absence of pancreas: Secondary | ICD-10-CM | POA: Diagnosis not present

## 2023-01-15 DIAGNOSIS — E7800 Pure hypercholesterolemia, unspecified: Secondary | ICD-10-CM | POA: Diagnosis not present

## 2023-01-15 DIAGNOSIS — E041 Nontoxic single thyroid nodule: Secondary | ICD-10-CM | POA: Diagnosis not present

## 2023-01-21 DIAGNOSIS — Z86711 Personal history of pulmonary embolism: Secondary | ICD-10-CM | POA: Diagnosis not present

## 2023-01-21 DIAGNOSIS — N401 Enlarged prostate with lower urinary tract symptoms: Secondary | ICD-10-CM | POA: Diagnosis not present

## 2023-01-21 DIAGNOSIS — G4733 Obstructive sleep apnea (adult) (pediatric): Secondary | ICD-10-CM | POA: Diagnosis not present

## 2023-01-21 DIAGNOSIS — D490 Neoplasm of unspecified behavior of digestive system: Secondary | ICD-10-CM | POA: Diagnosis not present

## 2023-01-21 DIAGNOSIS — D509 Iron deficiency anemia, unspecified: Secondary | ICD-10-CM | POA: Diagnosis not present

## 2023-01-21 DIAGNOSIS — D121 Benign neoplasm of appendix: Secondary | ICD-10-CM | POA: Diagnosis not present

## 2023-01-21 DIAGNOSIS — Z90410 Acquired total absence of pancreas: Secondary | ICD-10-CM | POA: Diagnosis not present

## 2023-01-21 DIAGNOSIS — Z7901 Long term (current) use of anticoagulants: Secondary | ICD-10-CM | POA: Diagnosis not present

## 2023-01-21 DIAGNOSIS — Z86718 Personal history of other venous thrombosis and embolism: Secondary | ICD-10-CM | POA: Diagnosis not present

## 2023-01-21 DIAGNOSIS — D123 Benign neoplasm of transverse colon: Secondary | ICD-10-CM | POA: Diagnosis not present

## 2023-01-21 DIAGNOSIS — Z87441 Personal history of nephrotic syndrome: Secondary | ICD-10-CM | POA: Diagnosis not present

## 2023-01-21 DIAGNOSIS — N189 Chronic kidney disease, unspecified: Secondary | ICD-10-CM | POA: Diagnosis not present

## 2023-01-21 DIAGNOSIS — D124 Benign neoplasm of descending colon: Secondary | ICD-10-CM | POA: Diagnosis not present

## 2023-01-21 DIAGNOSIS — I10 Essential (primary) hypertension: Secondary | ICD-10-CM | POA: Diagnosis not present

## 2023-01-21 DIAGNOSIS — I129 Hypertensive chronic kidney disease with stage 1 through stage 4 chronic kidney disease, or unspecified chronic kidney disease: Secondary | ICD-10-CM | POA: Diagnosis not present

## 2023-01-21 DIAGNOSIS — Z90411 Acquired partial absence of pancreas: Secondary | ICD-10-CM | POA: Diagnosis not present

## 2023-01-21 DIAGNOSIS — C189 Malignant neoplasm of colon, unspecified: Secondary | ICD-10-CM

## 2023-01-21 DIAGNOSIS — K9171 Accidental puncture and laceration of a digestive system organ or structure during a digestive system procedure: Secondary | ICD-10-CM | POA: Diagnosis not present

## 2023-01-21 DIAGNOSIS — C184 Malignant neoplasm of transverse colon: Secondary | ICD-10-CM | POA: Diagnosis present

## 2023-01-21 DIAGNOSIS — Z8616 Personal history of COVID-19: Secondary | ICD-10-CM | POA: Diagnosis not present

## 2023-01-21 DIAGNOSIS — M0600 Rheumatoid arthritis without rheumatoid factor, unspecified site: Secondary | ICD-10-CM | POA: Diagnosis not present

## 2023-01-21 DIAGNOSIS — D3A8 Other benign neuroendocrine tumors: Secondary | ICD-10-CM | POA: Diagnosis not present

## 2023-01-21 DIAGNOSIS — N042 Nephrotic syndrome with diffuse membranous glomerulonephritis, unspecified: Secondary | ICD-10-CM | POA: Diagnosis not present

## 2023-01-21 DIAGNOSIS — K219 Gastro-esophageal reflux disease without esophagitis: Secondary | ICD-10-CM | POA: Diagnosis not present

## 2023-01-21 DIAGNOSIS — K660 Peritoneal adhesions (postprocedural) (postinfection): Secondary | ICD-10-CM | POA: Diagnosis not present

## 2023-01-21 DIAGNOSIS — G8918 Other acute postprocedural pain: Secondary | ICD-10-CM | POA: Diagnosis not present

## 2023-01-21 DIAGNOSIS — E041 Nontoxic single thyroid nodule: Secondary | ICD-10-CM | POA: Diagnosis not present

## 2023-01-21 DIAGNOSIS — Z87891 Personal history of nicotine dependence: Secondary | ICD-10-CM | POA: Diagnosis not present

## 2023-01-21 DIAGNOSIS — E7800 Pure hypercholesterolemia, unspecified: Secondary | ICD-10-CM | POA: Diagnosis not present

## 2023-01-21 HISTORY — DX: Malignant neoplasm of colon, unspecified: C18.9

## 2023-01-22 DIAGNOSIS — D124 Benign neoplasm of descending colon: Secondary | ICD-10-CM | POA: Diagnosis not present

## 2023-01-31 DIAGNOSIS — D3A8 Other benign neuroendocrine tumors: Secondary | ICD-10-CM | POA: Diagnosis not present

## 2023-01-31 DIAGNOSIS — C184 Malignant neoplasm of transverse colon: Secondary | ICD-10-CM | POA: Diagnosis not present

## 2023-01-31 DIAGNOSIS — D500 Iron deficiency anemia secondary to blood loss (chronic): Secondary | ICD-10-CM | POA: Diagnosis not present

## 2023-01-31 DIAGNOSIS — K922 Gastrointestinal hemorrhage, unspecified: Secondary | ICD-10-CM | POA: Diagnosis not present

## 2023-02-01 ENCOUNTER — Other Ambulatory Visit (HOSPITAL_COMMUNITY): Payer: Self-pay | Admitting: Internal Medicine

## 2023-02-01 DIAGNOSIS — E041 Nontoxic single thyroid nodule: Secondary | ICD-10-CM

## 2023-02-05 DIAGNOSIS — C7A8 Other malignant neuroendocrine tumors: Secondary | ICD-10-CM | POA: Diagnosis not present

## 2023-02-05 DIAGNOSIS — C189 Malignant neoplasm of colon, unspecified: Secondary | ICD-10-CM | POA: Diagnosis not present

## 2023-02-05 DIAGNOSIS — Z09 Encounter for follow-up examination after completed treatment for conditions other than malignant neoplasm: Secondary | ICD-10-CM | POA: Diagnosis not present

## 2023-02-05 DIAGNOSIS — D137 Benign neoplasm of endocrine pancreas: Secondary | ICD-10-CM | POA: Diagnosis not present

## 2023-02-07 ENCOUNTER — Ambulatory Visit (HOSPITAL_COMMUNITY)
Admission: RE | Admit: 2023-02-07 | Discharge: 2023-02-07 | Disposition: A | Discharge status: Home / Self Care | Payer: Medicare Other | Source: Ambulatory Visit | Attending: Internal Medicine | Admitting: Internal Medicine

## 2023-02-07 DIAGNOSIS — E041 Nontoxic single thyroid nodule: Secondary | ICD-10-CM | POA: Diagnosis not present

## 2023-03-06 DIAGNOSIS — Z87891 Personal history of nicotine dependence: Secondary | ICD-10-CM | POA: Diagnosis not present

## 2023-03-06 DIAGNOSIS — Z23 Encounter for immunization: Secondary | ICD-10-CM | POA: Diagnosis not present

## 2023-03-06 DIAGNOSIS — N022 Recurrent and persistent hematuria with diffuse membranous glomerulonephritis: Secondary | ICD-10-CM | POA: Diagnosis not present

## 2023-03-06 DIAGNOSIS — N182 Chronic kidney disease, stage 2 (mild): Secondary | ICD-10-CM | POA: Diagnosis not present

## 2023-03-06 DIAGNOSIS — N052 Unspecified nephritic syndrome with diffuse membranous glomerulonephritis: Secondary | ICD-10-CM | POA: Diagnosis not present

## 2023-03-14 ENCOUNTER — Ambulatory Visit: Payer: Medicare Other | Admitting: Gastroenterology

## 2023-03-14 ENCOUNTER — Encounter: Payer: Self-pay | Admitting: Gastroenterology

## 2023-03-14 VITALS — BP 122/62 | HR 62 | Ht 73.0 in | Wt 178.0 lb

## 2023-03-14 DIAGNOSIS — Z9049 Acquired absence of other specified parts of digestive tract: Secondary | ICD-10-CM

## 2023-03-14 DIAGNOSIS — C189 Malignant neoplasm of colon, unspecified: Secondary | ICD-10-CM

## 2023-03-14 DIAGNOSIS — Z860100 Personal history of colon polyps, unspecified: Secondary | ICD-10-CM

## 2023-03-14 DIAGNOSIS — D137 Benign neoplasm of endocrine pancreas: Secondary | ICD-10-CM | POA: Diagnosis not present

## 2023-03-14 DIAGNOSIS — Z90410 Acquired total absence of pancreas: Secondary | ICD-10-CM

## 2023-03-14 NOTE — Progress Notes (Signed)
Chief Complaint:    Postoperative follow-up  GI History: 77 y.o. 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   - 01/07/2018: Colonoscopy: 2 subcentimeter adenomas, diverticulosis, internal hemorrhoids - 11/28/2022: Colonoscopy: 3 mm cecal adenoma, 8 mm cecal SSP, 20 mm polyp in the descending colon which was endoscopically unresectable and tattoo placed (biopsied with path showing at least intramucosal adenocarcinoma with areas suspicious for submucosal invasion with background fragments of tubular adenoma and focal HGD).  Diverticulosis in the sigmoid and transverse colon, small internal hemorrhoids - 12/31/2022 Colonoscopy [facility]): 25 mm polyp in the proximal descending colon which was not felt to be endoscopically resectable and tattoo placed and referred for surgical resection   -11/28/2022: EGD: Normal esophagus, evidence of prior Whipple with healthy anastomosis.  Moderate non-H. pylori gastritis, normal small bowel.  Treated with high-dose Protonix x 4 weeks  HPI:     Patient is a 77 y.o. male presenting to the Gastroenterology Clinic for follow-up.  Was seen by Quentin Mulling on 10/10/2022 for hospital follow-up for recurrent E. coli bacteremia and IDA and referred for expedited EGD/colonoscopy on 11/28/2022 as outlined above.  Since then, underwent colonoscopy on 12/31/2022 with Dr. Wyline Mood at [facility] notable for 25 mm polyp in the proximal descending colon which was not felt to be endoscopically resectable and tattoo placed and referred for surgical resection.  Subsequently underwent open surgical resection by Dr. Luciano Cutter at [facility] on 01/21/2023.  He had follow-up with Dr. Luciano Cutter on 02/05/2023 after  right hemicolectomy and path showing invasive moderately differentiated adenocarcinoma of the transverse colon arising in tubular adenoma with high-grade dysplasia.  Incidental well-differentiated neuroendocrine tumor at the appendiceal tip.  0/16 lymph nodes involved.  Had follow-up with his Oncologist at [facility], Dr. Raynald Kemp on 01/28/2023 as well.  Today, states he feels well without any active issues or concerns.  Reviewed repeat labs from 03/06/2023: H/H11.4/36 with otherwise normal CBC, normal BMP  Review of systems:     No chest pain, no SOB, no fevers, no urinary sx   Past Medical History:  Diagnosis Date   ANA positive    Arthritis    Blood clot in vein    in lung; unprevoked pulmonary embolisms in  2018 and 04/07/2018 managed on xarelto    Cancer [facility])    insulinoma   Colon cancer (HCC) 01/21/2023   Diverticulosis    DVT (deep venous thrombosis) (HCC)    E coli bacteremia    Hypertension    Insulinoma    Membranous glomerulonephritis    Pancreatitis    Pulmonary embolism (HCC)    Pure hypercholesterolemia    Thyroid nodule     Patient's surgical history, family medical history, social history, medications and allergies were all reviewed in Epic    Current Outpatient Medications  Medication Sig Dispense Refill   predniSONE (DELTASONE) 1 MG tablet Take 9 mg by mouth daily with breakfast.     amLODipine (NORVASC) 5 MG tablet Take 5 mg by mouth at bedtime.     atorvastatin (LIPITOR) 40 MG tablet Take 40 mg by mouth at bedtime.   11   Calcium Carb-Cholecalciferol (CALCIUM 600 + D PO) Take 1 capsule by mouth daily.     Cyanocobalamin (B-12) 1000 MCG TABS Take  1 tablet by mouth daily.     ferrous sulfate 325 (65 FE) MG tablet Take 325 mg by mouth daily with breakfast. Pt states just holding as for now.     finasteride (PROSCAR) 5 MG tablet Take 5 mg by mouth at bedtime.     furosemide (LASIX) 20 MG tablet Take 20 mg by mouth daily.     hydroxychloroquine (PLAQUENIL) 200 MG  tablet Take 400 mg by mouth at bedtime.     losartan (COZAAR) 100 MG tablet Take 100 mg by mouth at bedtime.     Melatonin 5 MG CAPS Take 1 capsule by mouth daily.     pantoprazole (PROTONIX) 40 MG tablet TAKE 1 TABLET(40 MG) BY MOUTH TWICE DAILY 180 tablet 0   XARELTO 20 MG TABS tablet Take 20 mg by mouth daily.     zinc gluconate 50 MG tablet Take 50 mg by mouth daily.     No current facility-administered medications for this visit.    Physical Exam:     BP 122/62   Pulse 62   Ht 6\' 1"  (1.854 m)   Wt 178 lb (80.7 kg)   BMI 23.48 kg/m   GENERAL:  Pleasant male in NAD PSYCH: : Cooperative, normal affect NEURO: Alert and oriented x 3, no focal neurologic deficits   IMPRESSION and PLAN:    1) Colon cancer 2) History of colon polyps Diagnosed with stage I moderately differentiated adenocarcinoma of the transverse colon by colonoscopy in 11/2021, subsequently underwent right hemicolect[facility] in 01/20[facility] at [facility].  0/16 lymph nodes involved and no need for chemotherapy or radiation.  Doing well recovering postoperatively. - Continue follow-up in the [facility] - Tentative plan for repeat colonoscopy in 1 year for short interval surveillance  3) Appendiceal neuroendocrine tumor Incidentally noted small NET of the appendiceal tip at time of right hemicolectomy as above. - Continue follow-up with [facility] Oncology  4) History of insulinoma s/p Whipple 2002 -Continue follow-up with [facility] and [facility]      I spent 45 minutes of time, including in depth chart review, independent review of results as outlined above, communicating results with the patient directly, face-to-face time with the patient, coordinating care, ordering studies and medications as appropriate, and documentation.     Shellia Cleverly ,DO, FACG 03/14/2023, 9:50 AM

## 2023-03-14 NOTE — Patient Instructions (Signed)
_______________________________________________________  If your blood pressure at your visit was 140/90 or greater, please contact your primary care physician to follow up on this. _______________________________________________________  If you are age 76 or older, your body mass index should be between 23-30. Your Body mass index is 23.48 kg/m. If this is out of the aforementioned range listed, please consider follow up with your Primary Care Provider.  The [facility] providers would like to encourage you to use [facility] to communicate with providers for non-urgent requests or questions.  Due to long hold times on the telephone, sending your provider a message by [facility] may be a faster and more efficient way to get a response.  Please allow 48 business hours for a response.  Please remember that this is for non-urgent requests.  _______________________________________________________  Stephen Gross will need a follow up appointment in 6 months.  We will contact you to schedule this appointment.  It was a pleasure to see you today!  Vito Cirigliano, D.O.

## 2023-03-24 ENCOUNTER — Other Ambulatory Visit: Payer: Self-pay | Admitting: Gastroenterology

## 2023-03-28 DIAGNOSIS — H2513 Age-related nuclear cataract, bilateral: Secondary | ICD-10-CM | POA: Diagnosis not present

## 2023-04-15 DIAGNOSIS — N052 Unspecified nephritic syndrome with diffuse membranous glomerulonephritis: Secondary | ICD-10-CM | POA: Diagnosis not present

## 2023-04-15 DIAGNOSIS — D500 Iron deficiency anemia secondary to blood loss (chronic): Secondary | ICD-10-CM | POA: Diagnosis not present

## 2023-04-15 DIAGNOSIS — Z79899 Other long term (current) drug therapy: Secondary | ICD-10-CM | POA: Diagnosis not present

## 2023-04-15 DIAGNOSIS — M0600 Rheumatoid arthritis without rheumatoid factor, unspecified site: Secondary | ICD-10-CM | POA: Diagnosis not present

## 2023-04-30 DIAGNOSIS — Z9049 Acquired absence of other specified parts of digestive tract: Secondary | ICD-10-CM | POA: Diagnosis not present

## 2023-04-30 DIAGNOSIS — Z08 Encounter for follow-up examination after completed treatment for malignant neoplasm: Secondary | ICD-10-CM | POA: Diagnosis not present

## 2023-04-30 DIAGNOSIS — Z85038 Personal history of other malignant neoplasm of large intestine: Secondary | ICD-10-CM | POA: Diagnosis not present

## 2023-04-30 DIAGNOSIS — C189 Malignant neoplasm of colon, unspecified: Secondary | ICD-10-CM | POA: Diagnosis not present

## 2023-04-30 DIAGNOSIS — R918 Other nonspecific abnormal finding of lung field: Secondary | ICD-10-CM | POA: Diagnosis not present

## 2023-05-07 DIAGNOSIS — D490 Neoplasm of unspecified behavior of digestive system: Secondary | ICD-10-CM | POA: Diagnosis not present

## 2023-05-07 DIAGNOSIS — D137 Benign neoplasm of endocrine pancreas: Secondary | ICD-10-CM | POA: Diagnosis not present

## 2023-05-07 DIAGNOSIS — C189 Malignant neoplasm of colon, unspecified: Secondary | ICD-10-CM | POA: Diagnosis not present

## 2023-05-13 DIAGNOSIS — L820 Inflamed seborrheic keratosis: Secondary | ICD-10-CM | POA: Diagnosis not present

## 2023-05-13 DIAGNOSIS — L219 Seborrheic dermatitis, unspecified: Secondary | ICD-10-CM | POA: Diagnosis not present

## 2023-06-03 DIAGNOSIS — C184 Malignant neoplasm of transverse colon: Secondary | ICD-10-CM | POA: Diagnosis not present

## 2023-06-03 DIAGNOSIS — D6869 Other thrombophilia: Secondary | ICD-10-CM | POA: Diagnosis not present

## 2023-06-03 DIAGNOSIS — D3A8 Other benign neuroendocrine tumors: Secondary | ICD-10-CM | POA: Diagnosis not present

## 2023-06-03 DIAGNOSIS — Z86711 Personal history of pulmonary embolism: Secondary | ICD-10-CM | POA: Diagnosis not present

## 2023-07-13 ENCOUNTER — Emergency Department (HOSPITAL_BASED_OUTPATIENT_CLINIC_OR_DEPARTMENT_OTHER)

## 2023-07-13 ENCOUNTER — Encounter (HOSPITAL_BASED_OUTPATIENT_CLINIC_OR_DEPARTMENT_OTHER): Payer: Self-pay

## 2023-07-13 ENCOUNTER — Observation Stay (HOSPITAL_COMMUNITY)

## 2023-07-13 ENCOUNTER — Inpatient Hospital Stay (HOSPITAL_BASED_OUTPATIENT_CLINIC_OR_DEPARTMENT_OTHER)
Admission: EM | Admit: 2023-07-13 | Discharge: 2023-07-16 | DRG: 374 | Disposition: A | Discharge status: Home / Self Care | Attending: Family Medicine | Admitting: Family Medicine

## 2023-07-13 ENCOUNTER — Other Ambulatory Visit: Payer: Self-pay

## 2023-07-13 DIAGNOSIS — D509 Iron deficiency anemia, unspecified: Secondary | ICD-10-CM | POA: Diagnosis present

## 2023-07-13 DIAGNOSIS — Z87891 Personal history of nicotine dependence: Secondary | ICD-10-CM

## 2023-07-13 DIAGNOSIS — Z85038 Personal history of other malignant neoplasm of large intestine: Secondary | ICD-10-CM

## 2023-07-13 DIAGNOSIS — Z90411 Acquired partial absence of pancreas: Secondary | ICD-10-CM

## 2023-07-13 DIAGNOSIS — C189 Malignant neoplasm of colon, unspecified: Principal | ICD-10-CM | POA: Diagnosis present

## 2023-07-13 DIAGNOSIS — A4151 Sepsis due to Escherichia coli [E. coli]: Secondary | ICD-10-CM | POA: Diagnosis present

## 2023-07-13 DIAGNOSIS — A419 Sepsis, unspecified organism: Secondary | ICD-10-CM | POA: Diagnosis present

## 2023-07-13 DIAGNOSIS — Z9049 Acquired absence of other specified parts of digestive tract: Secondary | ICD-10-CM

## 2023-07-13 DIAGNOSIS — Z86718 Personal history of other venous thrombosis and embolism: Secondary | ICD-10-CM

## 2023-07-13 DIAGNOSIS — R509 Fever, unspecified: Secondary | ICD-10-CM | POA: Diagnosis not present

## 2023-07-13 DIAGNOSIS — E041 Nontoxic single thyroid nodule: Secondary | ICD-10-CM | POA: Diagnosis present

## 2023-07-13 DIAGNOSIS — G8929 Other chronic pain: Secondary | ICD-10-CM | POA: Diagnosis present

## 2023-07-13 DIAGNOSIS — K429 Umbilical hernia without obstruction or gangrene: Secondary | ICD-10-CM | POA: Diagnosis not present

## 2023-07-13 DIAGNOSIS — N052 Unspecified nephritic syndrome with diffuse membranous glomerulonephritis: Secondary | ICD-10-CM | POA: Diagnosis present

## 2023-07-13 DIAGNOSIS — R1110 Vomiting, unspecified: Secondary | ICD-10-CM | POA: Diagnosis not present

## 2023-07-13 DIAGNOSIS — R6510 Systemic inflammatory response syndrome (SIRS) of non-infectious origin without acute organ dysfunction: Secondary | ICD-10-CM | POA: Diagnosis not present

## 2023-07-13 DIAGNOSIS — I10 Essential (primary) hypertension: Secondary | ICD-10-CM | POA: Diagnosis present

## 2023-07-13 DIAGNOSIS — M0600 Rheumatoid arthritis without rheumatoid factor, unspecified site: Secondary | ICD-10-CM | POA: Diagnosis present

## 2023-07-13 DIAGNOSIS — K5730 Diverticulosis of large intestine without perforation or abscess without bleeding: Secondary | ICD-10-CM | POA: Diagnosis not present

## 2023-07-13 DIAGNOSIS — R112 Nausea with vomiting, unspecified: Secondary | ICD-10-CM | POA: Diagnosis not present

## 2023-07-13 DIAGNOSIS — Z79899 Other long term (current) drug therapy: Secondary | ICD-10-CM

## 2023-07-13 DIAGNOSIS — D3A8 Other benign neuroendocrine tumors: Secondary | ICD-10-CM | POA: Diagnosis present

## 2023-07-13 DIAGNOSIS — Z86711 Personal history of pulmonary embolism: Secondary | ICD-10-CM

## 2023-07-13 DIAGNOSIS — R059 Cough, unspecified: Secondary | ICD-10-CM | POA: Diagnosis not present

## 2023-07-13 DIAGNOSIS — H6092 Unspecified otitis externa, left ear: Secondary | ICD-10-CM | POA: Diagnosis present

## 2023-07-13 DIAGNOSIS — Z7901 Long term (current) use of anticoagulants: Secondary | ICD-10-CM

## 2023-07-13 DIAGNOSIS — J9811 Atelectasis: Secondary | ICD-10-CM | POA: Diagnosis not present

## 2023-07-13 DIAGNOSIS — R0602 Shortness of breath: Secondary | ICD-10-CM | POA: Diagnosis not present

## 2023-07-13 DIAGNOSIS — Z87441 Personal history of nephrotic syndrome: Secondary | ICD-10-CM

## 2023-07-13 DIAGNOSIS — Z886 Allergy status to analgesic agent status: Secondary | ICD-10-CM

## 2023-07-13 DIAGNOSIS — E7800 Pure hypercholesterolemia, unspecified: Secondary | ICD-10-CM | POA: Diagnosis present

## 2023-07-13 DIAGNOSIS — B9789 Other viral agents as the cause of diseases classified elsewhere: Secondary | ICD-10-CM | POA: Diagnosis present

## 2023-07-13 DIAGNOSIS — R7881 Bacteremia: Secondary | ICD-10-CM | POA: Diagnosis present

## 2023-07-13 DIAGNOSIS — Z7952 Long term (current) use of systemic steroids: Secondary | ICD-10-CM

## 2023-07-13 DIAGNOSIS — Z888 Allergy status to other drugs, medicaments and biological substances status: Secondary | ICD-10-CM

## 2023-07-13 DIAGNOSIS — M1990 Unspecified osteoarthritis, unspecified site: Secondary | ICD-10-CM | POA: Diagnosis present

## 2023-07-13 DIAGNOSIS — Z8249 Family history of ischemic heart disease and other diseases of the circulatory system: Secondary | ICD-10-CM

## 2023-07-13 DIAGNOSIS — R109 Unspecified abdominal pain: Secondary | ICD-10-CM | POA: Diagnosis not present

## 2023-07-13 DIAGNOSIS — J069 Acute upper respiratory infection, unspecified: Secondary | ICD-10-CM | POA: Diagnosis present

## 2023-07-13 LAB — RESPIRATORY PANEL BY PCR

## 2023-07-13 LAB — URINALYSIS, W/ REFLEX TO CULTURE (INFECTION SUSPECTED)
Bacteria, UA: NONE SEEN
Bilirubin Urine: NEGATIVE
Glucose, UA: NEGATIVE mg/dL
Hgb urine dipstick: NEGATIVE
Ketones, ur: NEGATIVE mg/dL
Leukocytes,Ua: NEGATIVE
Nitrite: NEGATIVE
Protein, ur: 100 mg/dL — AB
Specific Gravity, Urine: 1.02 (ref 1.005–1.030)
pH: 6 (ref 5.0–8.0)

## 2023-07-13 LAB — CBC
HCT: 35.6 % — ABNORMAL LOW (ref 39.0–52.0)
Hemoglobin: 11.4 g/dL — ABNORMAL LOW (ref 13.0–17.0)
MCH: 29.3 pg (ref 26.0–34.0)
MCHC: 32 g/dL (ref 30.0–36.0)
MCV: 91.5 fL (ref 80.0–100.0)
Platelets: 316 10*3/uL (ref 150–400)
RBC: 3.89 MIL/uL — ABNORMAL LOW (ref 4.22–5.81)
RDW: 14.2 % (ref 11.5–15.5)
WBC: 12.9 10*3/uL — ABNORMAL HIGH (ref 4.0–10.5)
nRBC: 0 % (ref 0.0–0.2)

## 2023-07-13 LAB — RESP PANEL BY RT-PCR (RSV, FLU A&B, COVID)  RVPGX2
Influenza A by PCR: NEGATIVE
Influenza B by PCR: NEGATIVE
Resp Syncytial Virus by PCR: NEGATIVE
SARS Coronavirus 2 by RT PCR: NEGATIVE

## 2023-07-13 LAB — COMPREHENSIVE METABOLIC PANEL WITH GFR
ALT: 23 U/L (ref 0–44)
AST: 26 U/L (ref 15–41)
Albumin: 3.8 g/dL (ref 3.5–5.0)
Alkaline Phosphatase: 50 U/L (ref 38–126)
Anion gap: 5 (ref 5–15)
BUN: 18 mg/dL (ref 8–23)
CO2: 30 mmol/L (ref 22–32)
Calcium: 9.5 mg/dL (ref 8.9–10.3)
Chloride: 105 mmol/L (ref 98–111)
Creatinine, Ser: 0.84 mg/dL (ref 0.61–1.24)
GFR, Estimated: >60 mL/min (ref >60)
Glucose, Bld: 108 mg/dL — ABNORMAL HIGH (ref 70–99)
Potassium: 3.5 mmol/L (ref 3.5–5.1)
Sodium: 140 mmol/L (ref 135–145)
Total Bilirubin: 0.8 mg/dL (ref 0.0–1.2)
Total Protein: 6 g/dL — ABNORMAL LOW (ref 6.5–8.1)

## 2023-07-13 LAB — STREP PNEUMONIAE URINARY ANTIGEN: Strep Pneumo Urinary Antigen: NEGATIVE

## 2023-07-13 LAB — EXPECTORATED SPUTUM ASSESSMENT W GRAM STAIN, RFLX TO RESP C

## 2023-07-13 LAB — LACTIC ACID, PLASMA: Lactic Acid, Venous: 1.1 mmol/L (ref 0.5–1.9)

## 2023-07-13 LAB — MRSA NEXT GEN BY PCR, NASAL: MRSA by PCR Next Gen: NOT DETECTED

## 2023-07-13 LAB — LIPASE, BLOOD: Lipase: 14 U/L (ref 11–51)

## 2023-07-13 MED ORDER — ONDANSETRON HCL 4 MG/2ML IJ SOLN: 4.0000 mg | Freq: Four times a day (QID) | INTRAMUSCULAR | Status: DC | PRN

## 2023-07-13 MED ORDER — ONDANSETRON HCL 4 MG PO TABS: 4.0000 mg | ORAL_TABLET | Freq: Four times a day (QID) | ORAL | Status: DC | PRN

## 2023-07-13 MED ORDER — PANTOPRAZOLE SODIUM 40 MG PO TBEC
40.0000 mg | DELAYED_RELEASE_TABLET | Freq: Two times a day (BID) | ORAL | Status: DC
  Administered 2023-07-13 – 2023-07-16 (×6): 40 mg via ORAL
  Filled 2023-07-13 (×6): qty 1

## 2023-07-13 MED ORDER — ACETAMINOPHEN 500 MG PO TABS
1000.0000 mg | ORAL_TABLET | Freq: Once | ORAL | Status: AC
  Administered 2023-07-13: 1000 mg via ORAL
  Filled 2023-07-13: qty 2

## 2023-07-13 MED ORDER — HYDROXYCHLOROQUINE SULFATE 200 MG PO TABS
400.0000 mg | ORAL_TABLET | Freq: Every day | ORAL | Status: DC
  Administered 2023-07-13 – 2023-07-15 (×3): 400 mg via ORAL
  Filled 2023-07-13 (×4): qty 2

## 2023-07-13 MED ORDER — RIVAROXABAN 20 MG PO TABS
20.0000 mg | ORAL_TABLET | Freq: Every day | ORAL | Status: DC
  Administered 2023-07-13 – 2023-07-15 (×3): 20 mg via ORAL
  Filled 2023-07-13 (×3): qty 1

## 2023-07-13 MED ORDER — PIPERACILLIN-TAZOBACTAM 3.375 G IVPB 30 MIN
3.3750 g | Freq: Once | INTRAVENOUS | Status: AC
  Administered 2023-07-13: 3.375 g via INTRAVENOUS
  Filled 2023-07-13: qty 50

## 2023-07-13 MED ORDER — CIPROFLOXACIN-HYDROCORTISONE 0.2-1 % OT SUSP: 3.0000 [drp] | Freq: Two times a day (BID) | OTIC | Status: DC

## 2023-07-13 MED ORDER — FINASTERIDE 5 MG PO TABS
5.0000 mg | ORAL_TABLET | Freq: Every day | ORAL | Status: DC
  Administered 2023-07-13 – 2023-07-15 (×3): 5 mg via ORAL
  Filled 2023-07-13 (×3): qty 1

## 2023-07-13 MED ORDER — CIPROFLOXACIN-DEXAMETHASONE 0.3-0.1 % OT SUSP
3.0000 [drp] | Freq: Two times a day (BID) | OTIC | Status: DC
  Administered 2023-07-13 – 2023-07-16 (×6): 3 [drp] via OTIC
  Filled 2023-07-13: qty 7.5

## 2023-07-13 MED ORDER — ACETAMINOPHEN 650 MG RE SUPP: 650.0000 mg | Freq: Four times a day (QID) | RECTAL | Status: DC | PRN

## 2023-07-13 MED ORDER — ACETAMINOPHEN 325 MG PO TABS: 650.0000 mg | ORAL_TABLET | Freq: Four times a day (QID) | ORAL | Status: DC | PRN

## 2023-07-13 MED ORDER — ATORVASTATIN CALCIUM 40 MG PO TABS
40.0000 mg | ORAL_TABLET | Freq: Every day | ORAL | Status: DC
  Administered 2023-07-13 – 2023-07-15 (×3): 40 mg via ORAL
  Filled 2023-07-13 (×3): qty 1

## 2023-07-13 MED ORDER — POLYETHYLENE GLYCOL 3350 17 G PO PACK: 17.0000 g | PACK | Freq: Every day | ORAL | Status: DC | PRN

## 2023-07-13 MED ORDER — VITAMIN B-12 1000 MCG PO TABS
1000.0000 ug | ORAL_TABLET | Freq: Every day | ORAL | Status: DC
  Administered 2023-07-14 – 2023-07-16 (×3): 1000 ug via ORAL
  Filled 2023-07-13 (×3): qty 1

## 2023-07-13 MED ORDER — MELATONIN 5 MG PO TABS
5.0000 mg | ORAL_TABLET | Freq: Every day | ORAL | Status: DC
  Administered 2023-07-13: 5 mg via ORAL
  Filled 2023-07-13: qty 1

## 2023-07-13 MED ORDER — IOHEXOL 300 MG/ML  SOLN
100.0000 mL | Freq: Once | INTRAMUSCULAR | Status: AC | PRN
  Administered 2023-07-13: 100 mL via INTRAVENOUS

## 2023-07-13 MED ORDER — SODIUM CHLORIDE 0.9 % IV SOLN
500.0000 mg | Freq: Once | INTRAVENOUS | Status: AC
  Administered 2023-07-13: 500 mg via INTRAVENOUS
  Filled 2023-07-13: qty 5

## 2023-07-13 MED ORDER — PREDNISONE 20 MG PO TABS
20.0000 mg | ORAL_TABLET | Freq: Every day | ORAL | Status: DC
  Administered 2023-07-13 – 2023-07-15 (×3): 20 mg via ORAL
  Filled 2023-07-13 (×3): qty 1

## 2023-07-13 MED ORDER — SODIUM CHLORIDE 0.9 % IV BOLUS
1000.0000 mL | Freq: Once | INTRAVENOUS | Status: AC
  Administered 2023-07-13: 1000 mL via INTRAVENOUS

## 2023-07-13 MED ORDER — FERROUS SULFATE 325 (65 FE) MG PO TABS
325.0000 mg | ORAL_TABLET | ORAL | Status: DC
  Administered 2023-07-15: 325 mg via ORAL
  Filled 2023-07-13: qty 1

## 2023-07-13 MED ORDER — SODIUM CHLORIDE 0.9 % IV SOLN: 500.0000 mg | INTRAVENOUS | Status: DC

## 2023-07-13 MED ORDER — SODIUM CHLORIDE 0.9 % IV SOLN
2.0000 g | INTRAVENOUS | Status: DC
  Administered 2023-07-14 – 2023-07-15 (×2): 2 g via INTRAVENOUS
  Filled 2023-07-13 (×2): qty 20

## 2023-07-13 MED ORDER — ZINC SULFATE 220 (50 ZN) MG PO CAPS
220.0000 mg | ORAL_CAPSULE | Freq: Every day | ORAL | Status: DC
  Administered 2023-07-14 – 2023-07-16 (×3): 220 mg via ORAL
  Filled 2023-07-13 (×3): qty 1

## 2023-07-13 MED ORDER — ALBUTEROL SULFATE (2.5 MG/3ML) 0.083% IN NEBU: 2.5000 mg | INHALATION_SOLUTION | RESPIRATORY_TRACT | Status: DC | PRN

## 2023-07-13 MED ORDER — SODIUM CHLORIDE 0.9 % IV SOLN
1.0000 g | Freq: Once | INTRAVENOUS | Status: AC
  Administered 2023-07-13: 1 g via INTRAVENOUS
  Filled 2023-07-13: qty 10

## 2023-07-13 MED ORDER — OYSTER SHELL CALCIUM/D3 500-5 MG-MCG PO TABS
1.0000 | ORAL_TABLET | Freq: Every day | ORAL | Status: DC
  Administered 2023-07-14 – 2023-07-16 (×3): 1 via ORAL
  Filled 2023-07-13 (×3): qty 1

## 2023-07-13 MED ORDER — ONDANSETRON HCL 4 MG/2ML IJ SOLN
4.0000 mg | Freq: Once | INTRAMUSCULAR | Status: AC
  Administered 2023-07-13: 4 mg via INTRAVENOUS
  Filled 2023-07-13: qty 2

## 2023-07-13 MED ORDER — IOHEXOL 350 MG/ML SOLN
75.0000 mL | Freq: Once | INTRAVENOUS | Status: AC | PRN
  Administered 2023-07-13: 75 mL via INTRAVENOUS

## 2023-07-13 NOTE — ED Notes (Signed)
 Patient ambulate in hallway unsteady on feet.  Walked about 30 feet. With standby assist.  Expressed dizziness.

## 2023-07-13 NOTE — Progress Notes (Signed)
 ED Pharmacy Antibiotic Sign Off An antibiotic consult was received from an ED provider for zosyn per pharmacy dosing for sepsis. A chart review was completed to assess appropriateness.   The following one time order(s) were placed:  Zosyn 3.35g IV x 1 over 30 min  Further antibiotic and/or antibiotic pharmacy consults should be ordered by the admitting provider if indicated.   Thank you for allowing pharmacy to be a part of this patient's care.   Daylene Posey, [facility]  Clinical Pharmacist 07/13/23 3:12 PM

## 2023-07-13 NOTE — ED Notes (Signed)
 Called [facility] for transport, pt bed assignment is ready

## 2023-07-13 NOTE — ED Notes (Signed)
 Patient transported to CT

## 2023-07-13 NOTE — ED Triage Notes (Signed)
 Onset of three weeks of "cold" symptoms.  Onset today of fever, nausea and vomiting. Productive cough greenish.  Fever high of 102 Generalized weakness.

## 2023-07-13 NOTE — ED Provider Notes (Signed)
 [facility] DEPART[facility] AT [facility] Provider Note   CSN: [id number] Arrival date & time: 07/13/23  4098     History  Chief Complaint  Patient presents with   Emesis   HPI Kenzo Ozment is a 78 y.o. male with history of colon cancer status post resection in October 2024, insulinoma status post Whipple procedure in 2002, history of PE/DVT on Xarelto, recurrent E. coli bacteremia, hypertension presenting for cold symptoms and vomiting.  States he has had a productive cough, fever and nasal congestion for 3 weeks.  Fever this morning was 102 F.  Also reporting generalized weakness.  His wife also mentioned that he started vomiting this morning and patient reports generalized abdominal pain.  Had a normal bowel movement yesterday.  She also states that she had similar cold symptoms in the last couple weeks as well.  States she tested negative for flu and COVID.  Patient is followed by [facility] oncology.  Not currently receiving chemo or radiation.  His wife also reports that he has had "an E. coli blood infection in the past.   Emesis      Home Medications Prior to Admission medications   Medication Sig Start Date End Date Taking? Authorizing Provider  amLODipine (NORVASC) 5 MG tablet Take 5 mg by mouth at bedtime.    [provider]  atorvastatin (LIPITOR) 40 MG tablet Take 40 mg by mouth at bedtime.  12/06/17   [provider]  Calcium Carb-Cholecalciferol (CALCIUM 600 + D PO) Take 1 capsule by mouth daily.    [provider]  Cyanocobalamin (B-12) 1000 MCG TABS Take 1 tablet by mouth daily.    [provider]  ferrous sulfate 325 (65 FE) MG tablet Take 325 mg by mouth daily with breakfast. Pt states just holding as for now.    [provider]  finasteride (PROSCAR) 5 MG tablet Take 5 mg by mouth at bedtime.    [provider]  furosemide (LASIX) 20 MG tablet Take 20 mg by mouth daily.    [provider]   hydroxychloroquine (PLAQUENIL) 200 MG tablet Take 400 mg by mouth at bedtime.    [provider]  losartan (COZAAR) 100 MG tablet Take 100 mg by mouth at bedtime.    [provider]  Melatonin 5 MG CAPS Take 1 capsule by mouth daily.    [provider]  pantoprazole (PROTONIX) 40 MG tablet TAKE 1 TABLET(40 MG) BY MOUTH TWICE DAILY 03/25/23   Cirigliano, Vito V, DO  predniSONE (DELTASONE) 1 MG tablet Take 9 mg by mouth daily with breakfast.    [provider]  XARELTO 20 MG TABS tablet Take 20 mg by mouth daily. 02/20/19   [provider]  zinc gluconate 50 MG tablet Take 50 mg by mouth daily.    [provider]      Allergies    Lisinopril and Nsaids    Review of Systems   Review of Systems  Gastrointestinal:  Positive for vomiting.    Physical Exam Updated Vital Signs BP 119/73   Pulse (!) 101   Temp (!) 100.4 F (38 C) (Oral)   Resp 18   Ht 6\' 1"  (1.854 m)   Wt 79.4 kg   SpO2 94%   BMI 23.09 kg/m  Physical Exam Vitals and nursing note reviewed.  HENT:     Head: Normocephalic and atraumatic.     Mouth/Throat:     Mouth: Mucous membranes are moist.  Eyes:  General:        Right eye: No discharge.        Left eye: No discharge.     Conjunctiva/sclera: Conjunctivae normal.  Cardiovascular:     Rate and Rhythm: Normal rate and regular rhythm.     Pulses: Normal pulses.     Heart sounds: Normal heart sounds.  Pulmonary:     Effort: Pulmonary effort is normal.     Breath sounds: Normal breath sounds and air entry. No decreased breath sounds, wheezing, rhonchi or rales.  Abdominal:     General: Abdomen is flat.     Palpations: Abdomen is soft.  Skin:    General: Skin is warm and dry.  Neurological:     General: No focal deficit present.  Psychiatric:        Mood and Affect: Mood normal.     ED Results / Procedures / Treatments   Labs (all labs ordered are listed, but only abnormal results are  displayed) Labs Reviewed  COMPREHENSIVE METABOLIC PANEL WITH GFR - Abnormal; Notable for the following components:      Result Value   Glucose, Bld 108 (*)    Total Protein 6.0 (*)    All other components within normal limits  CBC - Abnormal; Notable for the following components:   WBC 12.9 (*)    RBC 3.89 (*)    Hemoglobin 11.4 (*)    HCT 35.6 (*)    All other components within normal limits  URINALYSIS, W/ REFLEX TO CULTURE (INFECTION SUSPECTED) - Abnormal; Notable for the following components:   Protein, ur 100 (*)    All other components within normal limits  RESP PANEL BY RT-PCR (RSV, FLU A&B, COVID)  RVPGX2  CULTURE, BLOOD (ROUTINE X 2)  CULTURE, BLOOD (ROUTINE X 2)  LIPASE, BLOOD  LACTIC ACID, PLASMA    EKG None  Radiology CT ABDOMEN PELVIS W CONTRAST Result Date: 07/13/2023 CLINICAL DATA:  78 year old male with acute abdominal pain. Previous Whipple in 2002. EXAM: CT ABDOMEN AND PELVIS WITH CONTRAST TECHNIQUE: Multidetector CT imaging of the abdomen and pelvis was performed using the standard protocol following bolus administration of intravenous contrast. RADIATION DOSE REDUCTION: This exam was performed according to the departmental dose-optimization program which includes automated exposure control, adjustment of the mA and/or kV according to patient size and/or use of iterative reconstruction technique. CONTRAST:  OMNIPAQUE IOHEXOL 300 MG/ML  SOLN COMPARISON:  Noncontrast CT Abdomen and Pelvis 12/09/2022. FINDINGS: Lower chest: Negative. Hepatobiliary: Chronic Whipple. Small volume pneumobilia has decreased since last year. Liver is otherwise within normal limits. Pancreas: Surgically absent pancreatic head. Body and tail appear within normal limits. Spleen: Diminutive, negative. Adrenals/Urinary Tract: Normal adrenal glands. Kidneys are nonobstructed. Symmetric renal enhancement. Normal contrast excretion on the delayed images. Diminutive ureters. No nephrolithiasis.  Circumferential bladder wall thickening is mild to moderate with bilateral small bladder diverticula (series 2, image 64). Stomach/Bowel: Circumferential thickening of the rectum on series 2, image 75 with no convincing adjacent mesenteric inflammation, might be under distension artifact. Extensive diverticulosis throughout the upstream sigmoid, descending colon. Chronic right hemicolectomy. Mid abdominal bowel anastomosis with no adverse features. No dilated small bowel. Superimposed chronic Whipple, gastrojejunostomy. Decompressed stomach. No pneumoperitoneum. No free air or convincing acute mesenteric inflammation. Vascular/Lymphatic: Minimal calcified atherosclerosis, generalized arterial tortuosity in the abdomen and pelvis. Negative for abdominal aortic aneurysm. Major arterial structures and the portal venous system appear to be patent. No lymphadenopathy identified. Reproductive: Prostatomegaly. Small fat containing umbilical hernia is chronic  and stable. Other: No pelvis free fluid. Musculoskeletal: No acute osseous abnormality identified. Mild for age spine degeneration, hyperostosis. IMPRESSION: 1. Indeterminate circumferential thickening of the rectum, might be artifact from under distension. Digital rectal exam might be valuable. 2. Extensive chronic postoperative changes in the abdomen. No bowel obstruction. Chronic severe diverticulosis of the residual large bowel, but no active inflammation of those segments. 3. No other acute or inflammatory process identified in the abdomen or pelvis. Electronically Signed   By: Odessa Fleming M.D.   On: 07/13/2023 12:18   DG Chest Port 1 View Result Date: 07/13/2023 CLINICAL DATA:  Cough.  Fever.  Vomiting. EXAM: PORTABLE CHEST 1 VIEW COMPARISON:  One view chest x-ray FINDINGS: The heart size and mediastinal contours are within normal limits. Both lungs are clear. The visualized skeletal structures are unremarkable. IMPRESSION: Negative one-view chest x-ray  Electronically Signed   By: Marin Roberts M.D.   On: 07/13/2023 11:47    Procedures .Critical Care  Performed by: Gareth Eagle, PA-C Authorized by: Gareth Eagle, PA-C   Critical care provider statement:    Critical care time (minutes):  30   Critical care was necessary to treat or prevent imminent or life-threatening deterioration of the following conditions:  Sepsis   Critical care was time spent personally by me on the following activities:  Development of treatment plan with patient or surrogate, discussions with consultants, evaluation of patient's response to treatment, examination of patient, ordering and review of laboratory studies, ordering and review of radiographic studies, ordering and performing treatments and interventions, pulse oximetry, re-evaluation of patient's condition and review of old charts     Medications Ordered in ED Medications  piperacillin-tazobactam (ZOSYN) IVPB 3.375 g (has no administration in time range)  iohexol (OMNIPAQUE) 300 MG/ML solution 100 mL (100 mLs Intravenous Contrast Given 07/13/23 1119)  sodium chloride 0.9 % bolus 1,000 mL (0 mLs Intravenous Stopped 07/13/23 1335)  ondansetron (ZOFRAN) injection 4 mg (4 mg Intravenous Given 07/13/23 1139)  acetaminophen (TYLENOL) tablet 1,000 mg (1,000 mg Oral Given 07/13/23 1137)  cefTRIAXone (ROCEPHIN) 1 g in sodium chloride 0.9 % 100 mL IVPB (0 g Intravenous Stopped 07/13/23 1229)  azithromycin (ZITHROMAX) 500 mg in sodium chloride 0.9 % 250 mL IVPB (500 mg Intravenous New Bag/Given 07/13/23 1242)    ED Course/ Medical Decision Making/ A&P Clinical Course as of 07/13/23 1456  Sat Jul 13, 2023  1404 Patient also mention that he recently had hearing aids implanted into both ear canals 2 weeks ago.  Noted some discharge from the left ear and states he has had some pain in both ears as well.  I inspected the ears at the bedside, both ear canals appear to be edematous, TMs bilaterally appear grossly  normal without evidence of infection. [JR]    Clinical Course User Index [JR] Gareth Eagle, PA-C                                 Medical Decision Making Amount and/or Complexity of Data Reviewed Labs: ordered. Radiology: ordered.  Risk OTC drugs. Prescription drug management.   Initial Impression and Ddx 78 year old well-appearing male presenting for flulike symptoms vomiting.  Exam is unremarkable.  DDx includes pneumonia, sepsis, viral illness, intra-abdominal infection, bacteremia, otitis media or externa other. Patient PMH that increases complexity of ED encounter:   history of colon cancer status post resection in October 2024, insulinoma status post Whipple procedure  in 2002, history of PE/DVT on Xarelto, recurrent E. coli bacteremia, hypertension  Interpretation of Diagnostics - I independent reviewed and interpreted the labs as followed: Leukocytosis  - I independently visualized the following imaging with scope of interpretation limited to determining acute life threatening conditions related to emergency care: CT ab/pelvis, which revealed questionable circumferential thickening of the rectum but otherwise no other acute findings.  Chest x-ray was without acute cardiopulmonary process  Patient Reassessment and Ultimate Disposition/Management With ongoing fever, persistent cold symptoms and vomiting along with leukocytosis, there is concern for sepsis without etiology at this time.  Cultures are pending.  Treated initially with Rocephin and azithromycin for concern of CAP.  Discussed patient with hospital service.  Recommended a dose of Zosyn primarily to cover for pseudomonal infection in the setting of possible otitis externa.  Attempted to ambulate patient up and down the hall and he was unable to do so.  Was weak, needed assistance and felt lightheaded.  Admitted to hospital service for ongoing sepsis evaluation.  Patient management required discussion with the following  services or consulting groups:  Hospitalist Service  Complexity of Problems Addressed Acute complicated illness or Injury  Additional Data Reviewed and Analyzed Further history obtained from: Further history from spouse/family member, Past medical history and medications listed in the EMR, and Prior ED visit notes  Patient Encounter Risk Assessment Consideration of hospitalization         Final Clinical Impression(s) / ED Diagnoses Final diagnoses:  Cough, unspecified type  Vomiting, unspecified vomiting type, unspecified whether nausea present    Rx / DC Orders ED Discharge Orders     None         Gareth Eagle, PA-C 07/13/23 1456    Durwin Glaze, MD 07/13/23 1527

## 2023-07-13 NOTE — Progress Notes (Signed)
Blood cultures drawn x 2 before antibiotics started

## 2023-07-13 NOTE — H&P (Signed)
 History and Physical    Stephen Gross NWG:[id number] DOB: [birth year] DOA: 07/13/2023  PCP: [facility]  Patient coming from: home  I have personally briefly reviewed patient's old medical records in [facility]  Chief Complaint: fevers, chills, malaise  HPI: Stephen Gross is Stephen Gross 78 y.o. male with Stephen Gross complex medical history including insulinoma s/p whipple in 2003, hx PE x2 on xarelto, hx colon cancer s/p colectomy (01/2023), low grade neuroendocrine tumor followed by oncology, membranous glomerulonephritis, rheumatoid arthritis, and other medical issues here with fevers, chills, and malaise.  His symptoms started about 3 weeks ago.  Started with cold symptoms.  Has Stephen Gross friend who has similar symptoms and is still sick.  He notes started with sore throat, headache, nasal congestion.  Over the past 3 weeks, symptoms have progressed.  He now has cough productive of sputum.   Described as hacking cough.  He noted drainage from his left ear Stephen Gross few days ago - issues with his hearing aids.  Also, on the day of admission, has had fever, chills for the first time.  T max at home was 102.5.  He had at least 5 episodes of vomiting as well as some loose diarrhea.  He denies chest pain, shortness of breath, abdominal pain (other than discomfort with nausea/vomiting and chronic pain related to his hx surgery).  No change in urinary symptoms.  No rash.  No recent travel.  Drinks occasionally, not every day.  No smoking.  ED Course: Labs, imaging, antibiotics.  Admit with sepsis.    Review of Systems: As per HPI otherwise all other systems reviewed and are negative.  Past Medical History:  Diagnosis Date   ANA positive    Arthritis    Blood clot in vein    in lung; unprevoked pulmonary embolisms in  2018 and 04/07/2018 managed on xarelto    Cancer [facility])    insulinoma   Colon cancer (HCC) 01/21/2023   Diverticulosis    DVT (deep venous thrombosis) (HCC)    E coli bacteremia    Hypertension     Insulinoma    Membranous glomerulonephritis    Pancreatitis    Pulmonary embolism (HCC)    Pure hypercholesterolemia    Thyroid nodule     Past Surgical History:  Procedure Laterality Date   CARPAL TUNNEL RELEASE Bilateral    CHOLECYSTECTOMY     INGUINAL HERNIA REPAIR Left 03/19/2019   Procedure: OPEN REPAIR OF LEFT INGUINAL HERNIA WITH MESH;  Surgeon: Gaynelle Adu, MD;  Location: [facility];  Service: General;  Laterality: Left;   PANCREATICODUODENECTOMY  2002   RENAL BIOPSY     TONSILLECTOMY     WHIPPLE PROCEDURE      Social History  reports that he quit smoking about 53 years ago. His smoking use included cigarettes. He has never used smokeless tobacco. He reports current alcohol use. He reports that he does not use drugs.  Allergies  Allergen Reactions   Lisinopril Cough   Nsaids     Bleeding internally    Family History  Problem Relation Age of Onset   Heart disease Mother    Heart disease Father    Lung cancer Paternal Uncle    Colon cancer Neg Hx    Esophageal cancer Neg Hx    Prior to Admission medications   Medication Sig Start Date End Date Taking? Authorizing Provider  amLODipine (NORVASC) 5 MG tablet Take 5 mg by mouth at bedtime.    [provider]  atorvastatin (LIPITOR) 40 MG tablet Take 40 mg by mouth at bedtime.  12/06/17   [provider]  Calcium Carb-Cholecalciferol (CALCIUM 600 + D PO) Take 1 capsule by mouth daily.    [provider]  Cyanocobalamin (B-12) 1000 MCG TABS Take 1 tablet by mouth daily.    [provider]  ferrous sulfate 325 (65 FE) MG tablet Take 325 mg by mouth daily with breakfast. Pt states just holding as for now.    [provider]  finasteride (PROSCAR) 5 MG tablet Take 5 mg by mouth at bedtime.    [provider]  furosemide (LASIX) 20 MG tablet Take 20 mg by mouth daily.    [provider]  hydroxychloroquine (PLAQUENIL) 200 MG tablet Take 400 mg by mouth at  bedtime.    [provider]  losartan (COZAAR) 100 MG tablet Take 100 mg by mouth at bedtime.    [provider]  Melatonin 5 MG CAPS Take 1 capsule by mouth daily.    [provider]  pantoprazole (PROTONIX) 40 MG tablet TAKE 1 TABLET(40 MG) BY MOUTH TWICE DAILY 03/25/23   Cirigliano, Vito V, DO  predniSONE (DELTASONE) 1 MG tablet Take 9 mg by mouth daily with breakfast.    [provider]  XARELTO 20 MG TABS tablet Take 20 mg by mouth daily. 02/20/19   [provider]  zinc gluconate 50 MG tablet Take 50 mg by mouth daily.    [provider]    Physical Exam: Vitals:   07/13/23 1430 07/13/23 1516 07/13/23 1530 07/13/23 1700  BP: 114/62  (!) 108/55 (!) 112/59  Pulse: 94  84 74  Resp: 14  18 16   Temp:  99.3 F (37.4 C)  98.2 F (36.8 C)  TempSrc:  Oral  Oral  SpO2: 91%  94% 96%  Weight:      Height:        Constitutional: NAD, calm, comfortable Vitals:   07/13/23 1430 07/13/23 1516 07/13/23 1530 07/13/23 1700  BP: 114/62  (!) 108/55 (!) 112/59  Pulse: 94  84 74  Resp: 14  18 16   Temp:  99.3 F (37.4 C)  98.2 F (36.8 C)  TempSrc:  Oral  Oral  SpO2: 91%  94% 96%  Weight:      Height:       Eyes: PERRL ENMT: L TM appears erythematous, canal edematous and erythematous Neck: normal, supple, no appreciated cervical LAD Respiratory: diminished at R base Cardiovascular: Regular rate and rhythm, no murmurs / rubs / gallops. No extremity edema. Abdomen: no tenderness, no masses palpated. No hepatosplenomegaly. Bowel sounds positive.  Musculoskeletal: no clubbing / cyanosis. No joint deformity upper and lower extremities. Good ROM, no contractures. Normal muscle tone.  Skin: no rashes, lesions, ulcers. No induration Neurologic: CN 2-12 grossly intact. Moving all extremities  Labs on Admission: I have personally reviewed following labs and imaging studies  CBC: Recent Labs  Lab 07/13/23 1023  WBC 12.9*  HGB 11.4*  HCT  35.6*  MCV 91.5  PLT 316    Basic Metabolic Panel: Recent Labs  Lab 07/13/23 1023  NA 140  K 3.5  CL 105  CO2 30  GLUCOSE 108*  BUN 18  CREATININE 0.84  CALCIUM 9.5    GFR: Estimated Creatinine Clearance: 82.7 mL/min (by C-G formula based on SCr of 0.84 mg/dL).  Liver Function Tests: Recent Labs  Lab 07/13/23 1023  AST 26  ALT 23  ALKPHOS 50  BILITOT  0.8  PROT 6.0*  ALBUMIN 3.8    Urine analysis:    Component Value Date/Time   COLORURINE YELLOW 07/13/2023 1039   APPEARANCEUR CLEAR 07/13/2023 1039   LABSPEC 1.020 07/13/2023 1039   PHURINE 6.0 07/13/2023 1039   GLUCOSEU NEGATIVE 07/13/2023 1039   HGBUR NEGATIVE 07/13/2023 1039   BILIRUBINUR NEGATIVE 07/13/2023 1039   KETONESUR NEGATIVE 07/13/2023 1039   PROTEINUR 100 (Rodarius Kichline) 07/13/2023 1039   NITRITE NEGATIVE 07/13/2023 1039   LEUKOCYTESUR NEGATIVE 07/13/2023 1039    Radiological Exams on Admission: CT ABDOMEN PELVIS W CONTRAST Result Date: 07/13/2023 CLINICAL DATA:  78 year old male with acute abdominal pain. Previous Whipple in 2002. EXAM: CT ABDOMEN AND PELVIS WITH CONTRAST TECHNIQUE: Multidetector CT imaging of the abdomen and pelvis was performed using the standard protocol following bolus administration of intravenous contrast. RADIATION DOSE REDUCTION: This exam was performed according to the departmental dose-optimization program which includes automated exposure control, adjustment of the mA and/or kV according to patient size and/or use of iterative reconstruction technique. CONTRAST:  OMNIPAQUE IOHEXOL 300 MG/ML  SOLN COMPARISON:  Noncontrast CT Abdomen and Pelvis 12/09/2022. FINDINGS: Lower chest: Negative. Hepatobiliary: Chronic Whipple. Small volume pneumobilia has decreased since last year. Liver is otherwise within normal limits. Pancreas: Surgically absent pancreatic head. Body and tail appear within normal limits. Spleen: Diminutive, negative. Adrenals/Urinary Tract: Normal adrenal glands. Kidneys  are nonobstructed. Symmetric renal enhancement. Normal contrast excretion on the delayed images. Diminutive ureters. No nephrolithiasis. Circumferential bladder wall thickening is mild to moderate with bilateral small bladder diverticula (series 2, image 64). Stomach/Bowel: Circumferential thickening of the rectum on series 2, image 75 with no convincing adjacent mesenteric inflammation, might be under distension artifact. Extensive diverticulosis throughout the upstream sigmoid, descending colon. Chronic right hemicolectomy. Mid abdominal bowel anastomosis with no adverse features. No dilated small bowel. Superimposed chronic Whipple, gastrojejunostomy. Decompressed stomach. No pneumoperitoneum. No free air or convincing acute mesenteric inflammation. Vascular/Lymphatic: Minimal calcified atherosclerosis, generalized arterial tortuosity in the abdomen and pelvis. Negative for abdominal aortic aneurysm. Major arterial structures and the portal venous system appear to be patent. No lymphadenopathy identified. Reproductive: Prostatomegaly. Small fat containing umbilical hernia is chronic and stable. Other: No pelvis free fluid. Musculoskeletal: No acute osseous abnormality identified. Mild for age spine degeneration, hyperostosis. IMPRESSION: 1. Indeterminate circumferential thickening of the rectum, might be artifact from under distension. Digital rectal exam might be valuable. 2. Extensive chronic postoperative changes in the abdomen. No bowel obstruction. Chronic severe diverticulosis of the residual large bowel, but no active inflammation of those segments. 3. No other acute or inflammatory process identified in the abdomen or pelvis. Electronically Signed   By: Odessa Fleming M.D.   On: 07/13/2023 12:18   DG Chest Port 1 View Result Date: 07/13/2023 CLINICAL DATA:  Cough.  Fever.  Vomiting. EXAM: PORTABLE CHEST 1 VIEW COMPARISON:  One view chest x-ray FINDINGS: The heart size and mediastinal contours are within normal  limits. Both lungs are clear. The visualized skeletal structures are unremarkable. IMPRESSION: Negative one-view chest x-ray Electronically Signed   By: Marin Roberts M.D.   On: 07/13/2023 11:47    EKG: Independently reviewed. none  Assessment/Plan Principal Problem:   Sepsis (HCC) Active Problems:   Fever    Assessment and Plan:  Systemic Inflammatory Response Syndrome Fever, leukocytosis.  No clear source yet.  Possibly community acquired pneumonia +/- acute otitis media.   L tympanic membrane appeared erythematous, also reported drainage Decreased breath sounds to R base, faint rales UA not concerning  for UTI, negative nitrite, negative WBC's Sick dose steroids Will continue abx with ceftriaxone/azithro Follow CT chest given unremarkable CXR Urine strep, urine legionella, MRSA PCR Sputum culture, blood cultures RVP.  Negative covid, influenza, RSV.  Nausea  Vomiting  Diarrhea Related to above vs separate GI process Follow c diff and GI pathogen panel CT abd/pelvis without acute process (notable for chronic post op changes, severe diverticulosis, indeterminate circumferential thickening of rectum - artifact? Rectal exam deferred for the time being with unimpressive abdominal exam and suspicion that symptoms may be due to #1)  Ear Drainage Covering for otitis media.  Also possible otitis externa.  Will continue abx and add drops.   History of E. Coli Bacteremia On multiple occasions, following blood cultures  Hx Insulinoma s/p Whipple In 2003  Stage 1 Colon Cancer Low Grade Neuroendocrine Cancer Follows with oncology at [facility] S/p right colectomy 01/21/2023  Membranous Glomerulonephritis Hx Nephrotic Syndrome UA notable for 100 mg/dl protein Noted  Seronegative Rheumatoid Arthritis Plaquenil, prednisone Was previously on rituximab - this is currently on hold   History Recurrent PE Continue xarelto, hasn't had his dose today yet    DVT  prophylaxis: xarelto  Code Status:   full  Family Communication:  Wife at bedside  Disposition Plan:   Patient is from:  home  Anticipated DC to:  home  Anticipated DC date:  Pending further improvement, 48 hrs  Anticipated DC barriers: Pending further improvemetn  Consults called:  none  Admission status:  obs   Severity of Illness: The appropriate patient status for this patient is OBSERVATION. Observation status is judged to be reasonable and necessary in order to provide the required intensity of service to ensure the patient's safety. The patient's presenting symptoms, physical exam findings, and initial radiographic and laboratory data in the context of their medical condition is felt to place them at decreased risk for further clinical deterioration. Furthermore, it is anticipated that the patient will be medically stable for discharge from the hospital within 2 midnights of admission.     Lacretia Nicks MD [facility]  How to contact the [facility] Attending or Consulting provider 7A - 7P or covering provider during after hours 7P -7A, for this patient?   Check the care team in [facility] and look for Laurence Crofford) attending/consulting [facility] provider listed and b) the [facility] team listed Log into [email or web address] and use [facility] universal password to access. If you do not have the password, please contact the hospital operator. Locate the [facility] provider you are looking for under [facility] and page to Hildreth Robart number that you can be directly reached. If you still have difficulty reaching the provider, please page the [facility] (Director on Call) for the Hospitalists listed on amion for assistance.  07/13/2023, 6:08 PM

## 2023-07-14 DIAGNOSIS — N052 Unspecified nephritic syndrome with diffuse membranous glomerulonephritis: Secondary | ICD-10-CM | POA: Diagnosis present

## 2023-07-14 DIAGNOSIS — Z9049 Acquired absence of other specified parts of digestive tract: Secondary | ICD-10-CM | POA: Diagnosis not present

## 2023-07-14 DIAGNOSIS — D509 Iron deficiency anemia, unspecified: Secondary | ICD-10-CM | POA: Diagnosis present

## 2023-07-14 DIAGNOSIS — E7800 Pure hypercholesterolemia, unspecified: Secondary | ICD-10-CM | POA: Diagnosis present

## 2023-07-14 DIAGNOSIS — J069 Acute upper respiratory infection, unspecified: Secondary | ICD-10-CM | POA: Diagnosis present

## 2023-07-14 DIAGNOSIS — C189 Malignant neoplasm of colon, unspecified: Secondary | ICD-10-CM | POA: Diagnosis present

## 2023-07-14 DIAGNOSIS — B9789 Other viral agents as the cause of diseases classified elsewhere: Secondary | ICD-10-CM | POA: Diagnosis present

## 2023-07-14 DIAGNOSIS — R7881 Bacteremia: Secondary | ICD-10-CM | POA: Diagnosis not present

## 2023-07-14 DIAGNOSIS — R509 Fever, unspecified: Secondary | ICD-10-CM | POA: Diagnosis present

## 2023-07-14 DIAGNOSIS — Z87441 Personal history of nephrotic syndrome: Secondary | ICD-10-CM | POA: Diagnosis not present

## 2023-07-14 DIAGNOSIS — Z886 Allergy status to analgesic agent status: Secondary | ICD-10-CM | POA: Diagnosis not present

## 2023-07-14 DIAGNOSIS — R059 Cough, unspecified: Secondary | ICD-10-CM | POA: Diagnosis not present

## 2023-07-14 DIAGNOSIS — Z85038 Personal history of other malignant neoplasm of large intestine: Secondary | ICD-10-CM | POA: Diagnosis not present

## 2023-07-14 DIAGNOSIS — E041 Nontoxic single thyroid nodule: Secondary | ICD-10-CM | POA: Diagnosis present

## 2023-07-14 DIAGNOSIS — Z86718 Personal history of other venous thrombosis and embolism: Secondary | ICD-10-CM | POA: Diagnosis not present

## 2023-07-14 DIAGNOSIS — Z7901 Long term (current) use of anticoagulants: Secondary | ICD-10-CM | POA: Diagnosis not present

## 2023-07-14 DIAGNOSIS — Z8249 Family history of ischemic heart disease and other diseases of the circulatory system: Secondary | ICD-10-CM | POA: Diagnosis not present

## 2023-07-14 DIAGNOSIS — D3A8 Other benign neuroendocrine tumors: Secondary | ICD-10-CM | POA: Diagnosis present

## 2023-07-14 DIAGNOSIS — B9620 Unspecified Escherichia coli [E. coli] as the cause of diseases classified elsewhere: Secondary | ICD-10-CM | POA: Diagnosis not present

## 2023-07-14 DIAGNOSIS — A4151 Sepsis due to Escherichia coli [E. coli]: Secondary | ICD-10-CM | POA: Diagnosis present

## 2023-07-14 DIAGNOSIS — Z86711 Personal history of pulmonary embolism: Secondary | ICD-10-CM | POA: Diagnosis not present

## 2023-07-14 DIAGNOSIS — G8929 Other chronic pain: Secondary | ICD-10-CM | POA: Diagnosis present

## 2023-07-14 DIAGNOSIS — M0600 Rheumatoid arthritis without rheumatoid factor, unspecified site: Secondary | ICD-10-CM | POA: Diagnosis present

## 2023-07-14 DIAGNOSIS — Z90411 Acquired partial absence of pancreas: Secondary | ICD-10-CM | POA: Diagnosis not present

## 2023-07-14 DIAGNOSIS — I10 Essential (primary) hypertension: Secondary | ICD-10-CM | POA: Diagnosis present

## 2023-07-14 DIAGNOSIS — M1990 Unspecified osteoarthritis, unspecified site: Secondary | ICD-10-CM | POA: Diagnosis present

## 2023-07-14 DIAGNOSIS — H6092 Unspecified otitis externa, left ear: Secondary | ICD-10-CM | POA: Diagnosis present

## 2023-07-14 DIAGNOSIS — Z87891 Personal history of nicotine dependence: Secondary | ICD-10-CM | POA: Diagnosis not present

## 2023-07-14 LAB — COMPREHENSIVE METABOLIC PANEL WITH GFR
ALT: 43 U/L (ref 0–44)
AST: 36 U/L (ref 15–41)
Albumin: 2.8 g/dL — ABNORMAL LOW (ref 3.5–5.0)
Alkaline Phosphatase: 44 U/L (ref 38–126)
Anion gap: 5 (ref 5–15)
BUN: 19 mg/dL (ref 8–23)
CO2: 25 mmol/L (ref 22–32)
Calcium: 8.8 mg/dL — ABNORMAL LOW (ref 8.9–10.3)
Chloride: 106 mmol/L (ref 98–111)
Creatinine, Ser: 0.81 mg/dL (ref 0.61–1.24)
GFR, Estimated: >60 mL/min (ref >60)
Glucose, Bld: 153 mg/dL — ABNORMAL HIGH (ref 70–99)
Potassium: 4.1 mmol/L (ref 3.5–5.1)
Sodium: 136 mmol/L (ref 135–145)
Total Bilirubin: 0.8 mg/dL (ref 0.0–1.2)
Total Protein: 5.4 g/dL — ABNORMAL LOW (ref 6.5–8.1)

## 2023-07-14 LAB — BLOOD CULTURE ID PANEL (REFLEXED) - BCID2

## 2023-07-14 LAB — HIV ANTIBODY (ROUTINE TESTING W REFLEX): HIV Screen 4th Generation wRfx: NONREACTIVE

## 2023-07-14 NOTE — Progress Notes (Signed)
 PHARMACY - PHYSICIAN COMMUNICATION CRITICAL VALUE ALERT - BLOOD CULTURE IDENTIFICATION (BCID)  Stephen Gross is an 78 y.o. male who presented to [facility] on 07/13/2023 with a chief complaint of fever, chills and cold symptoms  Assessment:  2/4 Ecoli  Name of physician (or Provider) Contacted: [place]  Current antibiotics: Ctx 2gm and azith  Changes to prescribed antibiotics recommended:  Patient is on recommended antibiotics - No changes needed  Results for orders placed or performed during the hospital encounter of 07/13/23  Blood Culture ID Panel (Reflexed) (Collected: 07/13/2023 10:37 AM)  Result Value Ref Range   Enterococcus faecalis NOT DETECTED NOT DETECTED   Enterococcus Faecium NOT DETECTED NOT DETECTED   Listeria monocytogenes NOT DETECTED NOT DETECTED   Staphylococcus species NOT DETECTED NOT DETECTED   Staphylococcus aureus (BCID) NOT DETECTED NOT DETECTED   Staphylococcus epidermidis NOT DETECTED NOT DETECTED   Staphylococcus lugdunensis NOT DETECTED NOT DETECTED   Streptococcus species NOT DETECTED NOT DETECTED   Streptococcus agalactiae NOT DETECTED NOT DETECTED   Streptococcus pneumoniae NOT DETECTED NOT DETECTED   Streptococcus pyogenes NOT DETECTED NOT DETECTED   A.calcoaceticus-baumannii NOT DETECTED NOT DETECTED   Bacteroides fragilis NOT DETECTED NOT DETECTED   Enterobacterales DETECTED (A) NOT DETECTED   Enterobacter cloacae complex NOT DETECTED NOT DETECTED   Escherichia coli DETECTED (A) NOT DETECTED   Klebsiella aerogenes NOT DETECTED NOT DETECTED   Klebsiella oxytoca NOT DETECTED NOT DETECTED   Klebsiella pneumoniae NOT DETECTED NOT DETECTED   Proteus species NOT DETECTED NOT DETECTED   Salmonella species NOT DETECTED NOT DETECTED   Serratia marcescens NOT DETECTED NOT DETECTED   Haemophilus influenzae NOT DETECTED NOT DETECTED   Neisseria meningitidis NOT DETECTED NOT DETECTED   Pseudomonas aeruginosa NOT DETECTED NOT DETECTED   Stenotrophomonas  maltophilia NOT DETECTED NOT DETECTED   Candida albicans NOT DETECTED NOT DETECTED   Candida auris NOT DETECTED NOT DETECTED   Candida glabrata NOT DETECTED NOT DETECTED   Candida krusei NOT DETECTED NOT DETECTED   Candida parapsilosis NOT DETECTED NOT DETECTED   Candida tropicalis NOT DETECTED NOT DETECTED   Cryptococcus neoformans/gattii NOT DETECTED NOT DETECTED   CTX-M ESBL NOT DETECTED NOT DETECTED   Carbapenem resistance IMP NOT DETECTED NOT DETECTED   Carbapenem resistance KPC NOT DETECTED NOT DETECTED   Carbapenem resistance NDM NOT DETECTED NOT DETECTED   Carbapenem resist OXA 48 LIKE NOT DETECTED NOT DETECTED   Carbapenem resistance VIM NOT DETECTED NOT DETECTED    Arley Phenix RPh 07/14/2023, 5:00 AM

## 2023-07-14 NOTE — Plan of Care (Signed)

## 2023-07-14 NOTE — Progress Notes (Signed)
 PROGRESS NOTE    Stephen Gross  BJY:[id number] DOB: 23-Jan-1946 DOA: 07/13/2023 PCP: [facility]  Chief Complaint  Patient presents with   Emesis    Brief Narrative:   Stephen Gross is Stephen Gross 78 y.o. male with Stephen Gross complex medical history including insulinoma s/p whipple in 2003, hx PE x2 on xarelto, hx colon cancer s/p colectomy (01/2023), low grade neuroendocrine tumor followed by oncology, membranous glomerulonephritis, rheumatoid arthritis, and other medical issues here with fevers, chills, and malaise.   He's been found to have e. Coli bacteremia.  Assessment & Plan:   Principal Problem:   Sepsis (HCC) Active Problems:   Fever   Cough   Systemic inflammatory response syndrome (HCC)   E coli bacteremia  Sepsis due to E. Coli Bacteremia Fever, leukocytosis.  E. Coli bacteremia. UA not consistent with UTI No clear source.  CT C/Anas Reister/P without acute or inflammatory process in the abdomen.  No acute airspace disease. In the past, his e. Coli bacteremia has been presumed to be possibly related to cholangitis in the context of his surgical anatomy (s/p whipple) Notably, post whipple, he'd had unexplained fevers (body aches, malaise) for years (20 years) intermittently prior to spontaneously recovering Sick dose steroids Will continue abx with ceftriaxone Urine strep negative, urine legionella pending, MRSA PCR negative Sputum culture, blood cultures with e. coli RVP notable for rhinovirus.  Negative covid, influenza, RSV.   Upper Respiratory Infection Rhinovirus positive Symptomatic management  Nausea  Vomiting  Diarrhea Likely due to e. Coli bacteremia Follow c diff and GI pathogen panel CT abd/pelvis without acute process (notable for chronic post op changes, severe diverticulosis, indeterminate circumferential thickening of rectum - artifact? Rectal exam deferred for the time being with unimpressive abdominal exam and suspicion that symptoms may be due to #1)   Ear  Drainage Covering for otitis media.  Also possible otitis externa.  Will continue abx and add drops.    History of E. Coli Bacteremia On multiple occasions, following blood cultures   Hx Insulinoma s/p Whipple In 2003   Stage 1 Colon Cancer Low Grade Neuroendocrine Cancer Follows with oncology at [facility] S/p right colectomy 01/21/2023   Membranous Glomerulonephritis Hx Nephrotic Syndrome UA notable for 100 mg/dl protein Noted  Seronegative Rheumatoid Arthritis Plaquenil, prednisone Was previously on rituximab - this is currently on hold    History Recurrent PE Continue xarelto     DVT prphylaxis: xarelto Code Status: full Family Communication: wife Disposition:   Status is: Inpatient Remains inpatient appropriate because: need for ongoing care   Consultants:  none  Procedures:  none  Antimicrobials:  Anti-infectives (From admission, onward)    Start     Dose/Rate Route Frequency Ordered Stop   07/14/23 1200  azithromycin (ZITHROMAX) 500 mg in sodium chloride 0.9 % 250 mL IVPB  Status:  Discontinued       Note to Pharmacy: Retime as needed   500 mg 250 mL/hr over 60 Minutes Intravenous Every 24 hours 07/13/23 1811 07/14/23 0841   07/14/23 1000  cefTRIAXone (ROCEPHIN) 2 g in sodium chloride 0.9 % 100 mL IVPB       Note to Pharmacy: Retime as needed   2 g 200 mL/hr over 30 Minutes Intravenous Every 24 hours 07/13/23 1811 07/21/23 0959   07/13/23 2200  hydroxychloroquine (PLAQUENIL) tablet 400 mg        400 mg Oral Daily at bedtime 07/13/23 1743     07/13/23 1500  piperacillin-tazobactam (ZOSYN) IVPB 3.375 g  3.375 g 100 mL/hr over 30 Minutes Intravenous  Once 07/13/23 1455 07/13/23 1545   07/13/23 1145  cefTRIAXone (ROCEPHIN) 1 g in sodium chloride 0.9 % 100 mL IVPB        1 g 200 mL/hr over 30 Minutes Intravenous  Once 07/13/23 1136 07/13/23 1229   07/13/23 1145  azithromycin (ZITHROMAX) 500 mg in sodium chloride 0.9 % 250 mL IVPB        500 mg 250  mL/hr over 60 Minutes Intravenous  Once 07/13/23 1136 07/13/23 1551       Subjective: Felling better today  Objective: Vitals:   07/13/23 1700 07/13/23 2154 07/14/23 0446 07/14/23 1339  BP: (!) 112/59 119/62 (!) 107/58 125/71  Pulse: 74 80 74 81  Resp: 16  16 16   Temp: 98.2 F (36.8 C) 98.2 F (36.8 C) 98.6 F (37 C) 98.6 F (37 C)  TempSrc: Oral Oral Oral Oral  SpO2: 96% 95% 96% 97%  Weight:      Height:        Intake/Output Summary (Last 24 hours) at 07/14/2023 1516 Last data filed at 07/14/2023 0720 Gross per 24 hour  Intake --  Output 800 ml  Net -800 ml   Filed Weights   07/13/23 1016  Weight: 79.4 kg    Examination:  General exam: Appears calm and comfortable  Respiratory system: Clear to auscultation. Respiratory effort normal. Cardiovascular system: RRR Gastrointestinal system: Abdomen is nondistended, soft and nontender Central nervous system: Alert and oriented. No focal neurological deficits. Extremities: no LEE   Data Reviewed: I have personally reviewed following labs and imaging studies  CBC: Recent Labs  Lab 07/13/23 1023  WBC 12.9*  HGB 11.4*  HCT 35.6*  MCV 91.5  PLT 316    Basic Metabolic Panel: Recent Labs  Lab 07/13/23 1023 07/14/23 0540  NA 140 136  K 3.5 4.1  CL 105 106  CO2 30 25  GLUCOSE 108* 153*  BUN 18 19  CREATININE 0.84 0.81  CALCIUM 9.5 8.8*    GFR: Estimated Creatinine Clearance: 85.8 mL/min (by C-G formula based on SCr of 0.81 mg/dL).  Liver Function Tests: Recent Labs  Lab 07/13/23 1023 07/14/23 0540  AST 26 36  ALT 23 43  ALKPHOS 50 44  BILITOT 0.8 0.8  PROT 6.0* 5.4*  ALBUMIN 3.8 2.8*    CBG: No results for input(s): "GLUCAP" in the last 168 hours.   Recent Results (from the past 240 hours)  Resp panel by RT-PCR (RSV, Flu Stephen Gross&B, Covid) Anterior Nasal Swab     Status: None   Collection Time: 07/13/23 10:23 AM   Specimen: Anterior Nasal Swab  Result Value Ref Range Status   SARS Coronavirus  2 by RT PCR NEGATIVE NEGATIVE Final    Comment: (NOTE) SARS-CoV-2 target nucleic acids are NOT DETECTED.  The SARS-CoV-2 RNA is generally detectable in upper respiratory specimens during the acute phase of infection. The lowest concentration of SARS-CoV-2 viral copies this assay can detect is 138 copies/mL. Danea Manter negative result does not preclude SARS-Cov-2 infection and should not be used as the sole basis for treatment or other patient management decisions. Avonte Sensabaugh negative result may occur with  improper specimen collection/handling, submission of specimen other than nasopharyngeal swab, presence of viral mutation(s) within the areas targeted by this assay, and inadequate number of viral copies(<138 copies/mL). Mollie Rossano negative result must be combined with clinical observations, patient history, and epidemiological information. The expected result is Negative.  Fact Sheet for Patients:  BloggerCourse.com  Fact Sheet for Healthcare Providers:  SeriousBroker.it  This test is no t yet approved or cleared by the [place] FDA and  has been authorized for detection and/or diagnosis of SARS-CoV-2 by FDA under an Emergency Use Authorization (EUA). This EUA will remain  in effect (meaning this test can be used) for the duration of the COVID-19 declaration under Section 564(b)(1) of the Act, 21 U.S.C.section 360bbb-3(b)(1), unless the authorization is terminated  or revoked sooner.       Influenza Ethanael Veith by PCR NEGATIVE NEGATIVE Final   Influenza B by PCR NEGATIVE NEGATIVE Final    Comment: (NOTE) The Xpert Xpress SARS-CoV-2/FLU/RSV plus assay is intended as an aid in the diagnosis of influenza from Nasopharyngeal swab specimens and should not be used as Abbygale Lapid sole basis for treatment. Nasal washings and aspirates are unacceptable for Xpert Xpress SARS-CoV-2/FLU/RSV testing.  Fact Sheet for Patients: BloggerCourse.com  Fact  Sheet for Healthcare Providers: SeriousBroker.it  This test is not yet approved or cleared by the [place] FDA and has been authorized for detection and/or diagnosis of SARS-CoV-2 by FDA under an Emergency Use Authorization (EUA). This EUA will remain in effect (meaning this test can be used) for the duration of the COVID-19 declaration under Section 564(b)(1) of the Act, 21 U.S.C. section 360bbb-3(b)(1), unless the authorization is terminated or revoked.     Resp Syncytial Virus by PCR NEGATIVE NEGATIVE Final    Comment: (NOTE) Fact Sheet for Patients: BloggerCourse.com  Fact Sheet for Healthcare Providers: SeriousBroker.it  This test is not yet approved or cleared by the [place] FDA and has been authorized for detection and/or diagnosis of SARS-CoV-2 by FDA under an Emergency Use Authorization (EUA). This EUA will remain in effect (meaning this test can be used) for the duration of the COVID-19 declaration under Section 564(b)(1) of the Act, 21 U.S.C. section 360bbb-3(b)(1), unless the authorization is terminated or revoked.  Performed at [facility], [address], [place], [place] [address]   Culture, blood (routine x 2)     Status: None (Preliminary result)   Collection Time: 07/13/23 10:23 AM   Specimen: BLOOD RIGHT ARM  Result Value Ref Range Status   Specimen Description   Final    BLOOD RIGHT ARM Performed at [facility], [address] [place], [place] [address]    Special Requests   Final    BOTTLES DRAWN AEROBIC AND ANAEROBIC Blood Culture results may not be optimal due to an excessive volume of blood received in culture bottles Performed at [facility], [address], [place], [place] [address]    Culture  Setup Time   Final    GRAM NEGATIVE RODS IN BOTH AEROBIC AND ANAEROBIC BOTTLES CRITICAL VALUE NOTED.  VALUE IS  CONSISTENT WITH PREVIOUSLY REPORTED AND CALLED VALUE. Performed at [facility], [address] [place], [place] [address]    Culture GRAM NEGATIVE RODS  Final   Report Status PENDING  Incomplete  Culture, blood (routine x 2)     Status: None (Preliminary result)   Collection Time: 07/13/23 10:37 AM   Specimen: BLOOD RIGHT ARM  Result Value Ref Range Status   Specimen Description   Final    BLOOD RIGHT ARM Performed at [facility], [address] [place], [place] [address]    Special Requests   Final    BOTTLES DRAWN AEROBIC AND ANAEROBIC Blood Culture results may not be optimal due to an excessive volume of blood  received in culture bottles Performed at [facility], [address], [place], [place] [address]    Culture  Setup Time   Final    GRAM NEGATIVE RODS ANAEROBIC BOTTLE ONLY CRITICAL RESULT CALLED TO, READ BACK BY AND VERIFIED WITH: PHARMD E JACKSON 07/14/2023 @ 0444 BY AB Performed at [facility], [address], [place], [place] [address]    Culture GRAM NEGATIVE RODS  Final   Report Status PENDING  Incomplete  Blood Culture ID Panel (Reflexed)     Status: Abnormal   Collection Time: 07/13/23 10:37 AM  Result Value Ref Range Status   Enterococcus faecalis NOT DETECTED NOT DETECTED Final   Enterococcus Faecium NOT DETECTED NOT DETECTED Final   Listeria monocytogenes NOT DETECTED NOT DETECTED Final   Staphylococcus species NOT DETECTED NOT DETECTED Final   Staphylococcus aureus (BCID) NOT DETECTED NOT DETECTED Final   Staphylococcus epidermidis NOT DETECTED NOT DETECTED Final   Staphylococcus lugdunensis NOT DETECTED NOT DETECTED Final   Streptococcus species NOT DETECTED NOT DETECTED Final   Streptococcus agalactiae NOT DETECTED NOT DETECTED Final   Streptococcus pneumoniae NOT DETECTED NOT DETECTED Final   Streptococcus pyogenes NOT DETECTED NOT DETECTED Final   Michaela Broski.calcoaceticus-baumannii NOT DETECTED NOT DETECTED Final    Bacteroides fragilis NOT DETECTED NOT DETECTED Final   Enterobacterales DETECTED (Clary Boulais) NOT DETECTED Final    Comment: Enterobacterales represent Byard Carranza large order of gram negative bacteria, not Krissi Willaims single organism. CRITICAL RESULT CALLED TO, READ BACK BY AND VERIFIED WITH: PHARMD E JACKSON 07/14/2023 @ 0444 BY AB    Enterobacter cloacae complex NOT DETECTED NOT DETECTED Final   Escherichia coli DETECTED (Chamari Cutbirth) NOT DETECTED Final    Comment: CRITICAL RESULT CALLED TO, READ BACK BY AND VERIFIED WITH: PHARMD E JACKSON 07/14/2023 @ 0444 BY AB    Klebsiella aerogenes NOT DETECTED NOT DETECTED Final   Klebsiella oxytoca NOT DETECTED NOT DETECTED Final   Klebsiella pneumoniae NOT DETECTED NOT DETECTED Final   Proteus species NOT DETECTED NOT DETECTED Final   Salmonella species NOT DETECTED NOT DETECTED Final   Serratia marcescens NOT DETECTED NOT DETECTED Final   Haemophilus influenzae NOT DETECTED NOT DETECTED Final   Neisseria meningitidis NOT DETECTED NOT DETECTED Final   Pseudomonas aeruginosa NOT DETECTED NOT DETECTED Final   Stenotrophomonas maltophilia NOT DETECTED NOT DETECTED Final   Candida albicans NOT DETECTED NOT DETECTED Final   Candida auris NOT DETECTED NOT DETECTED Final   Candida glabrata NOT DETECTED NOT DETECTED Final   Candida krusei NOT DETECTED NOT DETECTED Final   Candida parapsilosis NOT DETECTED NOT DETECTED Final   Candida tropicalis NOT DETECTED NOT DETECTED Final   Cryptococcus neoformans/gattii NOT DETECTED NOT DETECTED Final   CTX-M ESBL NOT DETECTED NOT DETECTED Final   Carbapenem resistance IMP NOT DETECTED NOT DETECTED Final   Carbapenem resistance KPC NOT DETECTED NOT DETECTED Final   Carbapenem resistance NDM NOT DETECTED NOT DETECTED Final   Carbapenem resist OXA 48 LIKE NOT DETECTED NOT DETECTED Final   Carbapenem resistance VIM NOT DETECTED NOT DETECTED Final    Comment: Performed at [facility], [address] [place], [place] [address]  MRSA Next Gen by  PCR, Nasal     Status: None   Collection Time: 07/13/23  5:23 PM   Specimen: Nasal Mucosa; Nasal Swab  Result Value Ref Range Status   MRSA by PCR Next Gen NOT DETECTED NOT DETECTED Final    Comment: (NOTE) The GeneXpert MRSA Assay (FDA approved  for NASAL specimens only), is one component of Tyhesha Dutson comprehensive MRSA colonization surveillance program. It is not intended to diagnose MRSA infection nor to guide or monitor treatment for MRSA infections. Test performance is not FDA approved in patients less than 60 years old. Performed at [facility], [address], [place], [place] [address]   Respiratory (~20 pathogens) panel by PCR     Status: Abnormal   Collection Time: 07/13/23  5:51 PM   Specimen: Nasopharyngeal Swab; Respiratory  Result Value Ref Range Status   Adenovirus NOT DETECTED NOT DETECTED Final   Coronavirus 229E NOT DETECTED NOT DETECTED Final    Comment: (NOTE) The Coronavirus on the Respiratory Panel, DOES NOT test for the novel  Coronavirus (2019 nCoV)    Coronavirus HKU1 NOT DETECTED NOT DETECTED Final   Coronavirus NL63 NOT DETECTED NOT DETECTED Final   Coronavirus OC43 NOT DETECTED NOT DETECTED Final   Metapneumovirus NOT DETECTED NOT DETECTED Final   Rhinovirus / Enterovirus DETECTED (Khyler Eschmann) NOT DETECTED Final   Influenza [employer]sha Guidry NOT DETECTED NOT DETECTED Final   Influenza B NOT DETECTED NOT DETECTED Final   Parainfluenza Virus 1 NOT DETECTED NOT DETECTED Final   Parainfluenza Virus 2 NOT DETECTED NOT DETECTED Final   Parainfluenza Virus 3 NOT DETECTED NOT DETECTED Final   Parainfluenza Virus 4 NOT DETECTED NOT DETECTED Final   Respiratory Syncytial Virus NOT DETECTED NOT DETECTED Final   Bordetella pertussis NOT DETECTED NOT DETECTED Final   Bordetella Parapertussis NOT DETECTED NOT DETECTED Final   Chlamydophila pneumoniae NOT DETECTED NOT DETECTED Final   Mycoplasma pneumoniae NOT DETECTED NOT DETECTED Final    Comment: Performed at [facility], [address], [place], [place] [address]  Expectorated Sputum Assessment w Gram Stain, Rflx to Resp Cult     Status: None   Collection Time: 07/13/23  7:42 PM   Specimen: Sputum  Result Value Ref Range Status   Specimen Description SPUTUM  Final   Special Requests NONE  Final   Sputum evaluation   Final    THIS SPECIMEN IS ACCEPTABLE FOR SPUTUM CULTURE Performed at [facility], [address] [place], [place] [address]    Report Status 07/13/2023 FINAL  Final  Culture, Respiratory w Gram Stain     Status: None (Preliminary result)   Collection Time: 07/13/23  7:42 PM   Specimen: SPU  Result Value Ref Range Status   Specimen Description   Final    SPUTUM Performed at [facility], [address] [place], [place] [address]    Special Requests   Final    NONE Reflexed from [phone] Performed at [facility], [address] [place], [place] [address]    Gram Stain   Final    RARE WBC PRESENT, PREDOMINANTLY PMN RARE GRAM POSITIVE COCCI RARE GRAM NEGATIVE RODS Performed at [facility], [address], [place], [place] [address]    Culture PENDING  Incomplete   Report Status PENDING  Incomplete         Radiology Studies: CT Angio Chest Pulmonary Embolism (PE) W or WO Contrast Result Date: 07/13/2023 CLINICAL DATA:  Fever short of breath EXAM: CT ANGIOGRAPHY CHEST WITH CONTRAST TECHNIQUE: Multidetector CT imaging of the chest was performed using the standard protocol during bolus administration of intravenous contrast. Multiplanar CT image reconstructions and MIPs were obtained to evaluate the vascular anatomy. RADIATION DOSE REDUCTION: This exam was performed according to the departmental dose-optimization  program which includes automated exposure control, adjustment of the mA and/or kV according to patient size and/or use of iterative reconstruction technique. CONTRAST:  75mL OMNIPAQUE IOHEXOL 350 MG/ML SOLN  COMPARISON:  Chest x-ray 07/13/2023, CT 12/06/2022, 04/11/2018 FINDINGS: Cardiovascular: Satisfactory opacification of the pulmonary arteries to the segmental level. No evidence of pulmonary embolism. Nonaneurysmal aorta. No dissection is seen. Normal cardiac size. Minimal coronary vascular calcification. No pericardial effusion Mediastinum/Nodes: No enlarged mediastinal, hilar, or axillary lymph nodes. Thyroid gland, trachea, and esophagus demonstrate no significant findings. Lungs/Pleura: Lungs are clear. No pleural effusion or pneumothorax. Multiple bilateral pulmonary nodules are redemonstrated. Right Peri fissural nodule measures 6 mm, previously 6 mm, series 12, image 88. Left lower lobe subpleural pulmonary nodule on series 12, image 111 measures 10 x 7 mm, on 2020 comparison, this measured 10 x 8 mm. No new lung nodule. Dependent atelectasis. Upper Abdomen: Pneumobilia as before.  No acute finding Musculoskeletal: No acute or suspicious osseous abnormality Review of the MIP images confirms the above findings. IMPRESSION: 1. Negative for acute pulmonary embolus. 2. No acute airspace disease 3. No significant change in pulmonary nodules as described above, no specific imaging follow-up is recommended Electronically Signed   By: Jasmine Pang M.D.   On: 07/13/2023 20:50   CT ABDOMEN PELVIS W CONTRAST Result Date: 07/13/2023 CLINICAL DATA:  78 year old male with acute abdominal pain. Previous Whipple in 2002. EXAM: CT ABDOMEN AND PELVIS WITH CONTRAST TECHNIQUE: Multidetector CT imaging of the abdomen and pelvis was performed using the standard protocol following bolus administration of intravenous contrast. RADIATION DOSE REDUCTION: This exam was performed according to the departmental dose-optimization program which includes automated exposure control, adjustment of the mA and/or kV according to patient size and/or use of iterative reconstruction technique. CONTRAST:  OMNIPAQUE IOHEXOL 300 MG/ML  SOLN  COMPARISON:  Noncontrast CT Abdomen and Pelvis 12/09/2022. FINDINGS: Lower chest: Negative. Hepatobiliary: Chronic Whipple. Small volume pneumobilia has decreased since last year. Liver is otherwise within normal limits. Pancreas: Surgically absent pancreatic head. Body and tail appear within normal limits. Spleen: Diminutive, negative. Adrenals/Urinary Tract: Normal adrenal glands. Kidneys are nonobstructed. Symmetric renal enhancement. Normal contrast excretion on the delayed images. Diminutive ureters. No nephrolithiasis. Circumferential bladder wall thickening is mild to moderate with bilateral small bladder diverticula (series 2, image 64). Stomach/Bowel: Circumferential thickening of the rectum on series 2, image 75 with no convincing adjacent mesenteric inflammation, might be under distension artifact. Extensive diverticulosis throughout the upstream sigmoid, descending colon. Chronic right hemicolectomy. Mid abdominal bowel anastomosis with no adverse features. No dilated small bowel. Superimposed chronic Whipple, gastrojejunostomy. Decompressed stomach. No pneumoperitoneum. No free air or convincing acute mesenteric inflammation. Vascular/Lymphatic: Minimal calcified atherosclerosis, generalized arterial tortuosity in the abdomen and pelvis. Negative for abdominal aortic aneurysm. Major arterial structures and the portal venous system appear to be patent. No lymphadenopathy identified. Reproductive: Prostatomegaly. Small fat containing umbilical hernia is chronic and stable. Other: No pelvis free fluid. Musculoskeletal: No acute osseous abnormality identified. Mild for age spine degeneration, hyperostosis. IMPRESSION: 1. Indeterminate circumferential thickening of the rectum, might be artifact from under distension. Digital rectal exam might be valuable. 2. Extensive chronic postoperative changes in the abdomen. No bowel obstruction. Chronic severe diverticulosis of the residual large bowel, but no active  inflammation of those segments. 3. No other acute or inflammatory process identified in the abdomen or pelvis. Electronically Signed   By: Odessa Fleming M.D.   On: 07/13/2023 12:18   DG Chest Port 1 View Result Date: 07/13/2023  CLINICAL DATA:  Cough.  Fever.  Vomiting. EXAM: PORTABLE CHEST 1 VIEW COMPARISON:  One view chest x-ray FINDINGS: The heart size and mediastinal contours are within normal limits. Both lungs are clear. The visualized skeletal structures are unremarkable. IMPRESSION: Negative one-view chest x-ray Electronically Signed   By: Marin Roberts M.D.   On: 07/13/2023 11:47        Scheduled Meds:  atorvastatin  40 mg Oral QHS   calcium-vitamin D  1 tablet Oral Daily   ciprofloxacin-dexamethasone  3 drop Left EAR BID   cyanocobalamin  1,000 mcg Oral Daily   [START ON 07/15/2023] ferrous sulfate  325 mg Oral Q M,W,F   finasteride  5 mg Oral QHS   hydroxychloroquine  400 mg Oral QHS   melatonin  5 mg Oral QHS   pantoprazole  40 mg Oral BID   predniSONE  20 mg Oral Q breakfast   rivaroxaban  20 mg Oral Daily   zinc sulfate (50mg  elemental zinc)  220 mg Oral Daily   Continuous Infusions:  cefTRIAXone (ROCEPHIN)  IV 2 g (07/14/23 0946)     LOS: 0 days    Time spent: over 30 min    Lacretia Nicks, MD [facility]   To contact the attending provider between 7A-7P or the covering provider during after hours 7P-7A, please log into the web site [email or web address] and access using universal [place] password for that web site. If you do not have the password, please call the hospital operator.  07/14/2023, 3:16 PM

## 2023-07-15 ENCOUNTER — Telehealth: Payer: Self-pay | Admitting: Gastroenterology

## 2023-07-15 DIAGNOSIS — R7881 Bacteremia: Secondary | ICD-10-CM

## 2023-07-15 DIAGNOSIS — B9620 Unspecified Escherichia coli [E. coli] as the cause of diseases classified elsewhere: Secondary | ICD-10-CM

## 2023-07-15 LAB — CBC WITH DIFFERENTIAL/PLATELET
Abs Immature Granulocytes: 0.03 10*3/uL (ref 0.00–0.07)
Basophils Absolute: 0 10*3/uL (ref 0.0–0.1)
Basophils Relative: 0 %
Eosinophils Absolute: 0.1 10*3/uL (ref 0.0–0.5)
Eosinophils Relative: 1 %
HCT: 32.8 % — ABNORMAL LOW (ref 39.0–52.0)
Hemoglobin: 10.2 g/dL — ABNORMAL LOW (ref 13.0–17.0)
Immature Granulocytes: 0 %
Lymphocytes Relative: 12 %
Lymphs Abs: 1.1 10*3/uL (ref 0.7–4.0)
MCH: 29.7 pg (ref 26.0–34.0)
MCHC: 31.1 g/dL (ref 30.0–36.0)
MCV: 95.3 fL (ref 80.0–100.0)
Monocytes Absolute: 1.3 10*3/uL — ABNORMAL HIGH (ref 0.1–1.0)
Monocytes Relative: 13 %
Neutro Abs: 7.2 10*3/uL (ref 1.7–7.7)
Neutrophils Relative %: 74 %
Platelets: 287 10*3/uL (ref 150–400)
RBC: 3.44 MIL/uL — ABNORMAL LOW (ref 4.22–5.81)
RDW: 14.4 % (ref 11.5–15.5)
WBC: 9.8 10*3/uL (ref 4.0–10.5)
nRBC: 0 % (ref 0.0–0.2)

## 2023-07-15 LAB — GASTROINTESTINAL PANEL BY PCR, STOOL (REPLACES STOOL CULTURE)

## 2023-07-15 LAB — COMPREHENSIVE METABOLIC PANEL WITH GFR
ALT: 35 U/L (ref 0–44)
AST: 24 U/L (ref 15–41)
Albumin: 2.6 g/dL — ABNORMAL LOW (ref 3.5–5.0)
Alkaline Phosphatase: 45 U/L (ref 38–126)
Anion gap: 8 (ref 5–15)
BUN: 21 mg/dL (ref 8–23)
CO2: 25 mmol/L (ref 22–32)
Calcium: 9.2 mg/dL (ref 8.9–10.3)
Chloride: 108 mmol/L (ref 98–111)
Creatinine, Ser: 0.77 mg/dL (ref 0.61–1.24)
GFR, Estimated: >60 mL/min (ref >60)
Glucose, Bld: 120 mg/dL — ABNORMAL HIGH (ref 70–99)
Potassium: 3.9 mmol/L (ref 3.5–5.1)
Sodium: 141 mmol/L (ref 135–145)
Total Bilirubin: 0.3 mg/dL (ref 0.0–1.2)
Total Protein: 5.2 g/dL — ABNORMAL LOW (ref 6.5–8.1)

## 2023-07-15 LAB — IRON AND TIBC
Iron: 16 ug/dL — ABNORMAL LOW (ref 45–182)
Saturation Ratios: 6 % — ABNORMAL LOW (ref 17.9–39.5)
TIBC: 279 ug/dL (ref 250–450)
UIBC: 263 ug/dL

## 2023-07-15 LAB — FOLATE: Folate: 11.8 ng/mL (ref >5.9)

## 2023-07-15 LAB — VITAMIN B12: Vitamin B-12: 1195 pg/mL — ABNORMAL HIGH (ref 180–914)

## 2023-07-15 LAB — MAGNESIUM: Magnesium: 2.1 mg/dL (ref 1.7–2.4)

## 2023-07-15 LAB — FERRITIN: Ferritin: 50 ng/mL (ref 24–336)

## 2023-07-15 LAB — PHOSPHORUS: Phosphorus: 2.9 mg/dL (ref 2.5–4.6)

## 2023-07-15 MED ORDER — PREDNISONE 20 MG PO TABS
20.0000 mg | ORAL_TABLET | Freq: Every day | ORAL | Status: DC
  Administered 2023-07-16: 20 mg via ORAL
  Filled 2023-07-15: qty 1

## 2023-07-15 MED ORDER — PREDNISONE 5 MG PO TABS: 5.0000 mg | ORAL_TABLET | Freq: Every day | ORAL | Status: DC

## 2023-07-15 MED ORDER — PREDNISONE 5 MG PO TABS: 10.0000 mg | ORAL_TABLET | Freq: Every day | ORAL | Status: DC

## 2023-07-15 NOTE — Progress Notes (Addendum)
 PROGRESS NOTE    Stephen Gross  ZOX:[id number] DOB: 26-Aug-1945 DOA: 07/13/2023 PCP: [facility]  Chief Complaint  Patient presents with   Emesis    Brief Narrative:   Stephen Gross is Stephen Gross 78 y.o. 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   He's been found to have e. Coli bacteremia.  Assessment & Plan:   Principal Problem:   Sepsis (HCC) Active Problems:   Fever   Cough   Systemic inflammatory response syndrome (HCC)   E coli bacteremia  Sepsis due to E. Coli Bacteremia Fever, leukocytosis.  E. Coli bacteremia. UA not consistent with UTI No clear source.  CT C/Stephen Gross/P without acute or inflammatory process in the abdomen.  No acute airspace disease. In the past, his e. Coli bacteremia has been presumed to be possibly related to cholangitis in the context of his surgical anatomy (s/p whipple) Notably, post whipple, he'd had unexplained fevers (body aches, malaise) for years (20 years) intermittently prior to spontaneously recovering Sick dose steroids Will continue abx with ceftriaxone Urine strep negative, urine legionella pending, MRSA PCR negative echo Sputum culture pending, blood cultures with e. coli RVP notable for rhinovirus.  Negative covid, influenza, RSV.   Upper Respiratory Infection Rhinovirus positive Symptomatic management  Nausea  Vomiting  Diarrhea Likely due to e. Coli bacteremia Follow c diff and GI pathogen panel CT abd/pelvis without acute process (notable for chronic post op changes, severe diverticulosis, indeterminate circumferential thickening of rectum - artifact? Rectal exam deferred for the time being with unimpressive abdominal exam and suspicion that symptoms may be due to  #1)   Ear Drainage Covering for otitis media.  Also possible otitis externa.  Will continue abx and add drops.    History of E. Coli Bacteremia On multiple occasions, following blood cultures   Iron Def Anemia Continue iron   Hx Insulinoma s/p Whipple In 2003   Stage 1 Colon Cancer Low Grade Neuroendocrine Cancer Follows with oncology at [facility] S/p right colectomy 01/21/2023   Membranous Glomerulonephritis Hx Nephrotic Syndrome UA notable for 100 mg/dl protein Noted  Seronegative Rheumatoid Arthritis Plaquenil, prednisone Was previously on rituximab - this is currently on hold    History Recurrent PE Continue xarelto     DVT prphylaxis: xarelto Code Status: full Family Communication: wife Disposition:   Status is: Inpatient Remains inpatient appropriate because: need for ongoing care   Consultants:  none  Procedures:  none  Antimicrobials:  Anti-infectives (From admission, onward)    Start     Dose/Rate Route Frequency Ordered Stop   07/14/23 1200  azithromycin (ZITHROMAX) 500 mg in sodium chloride 0.9 % 250 mL IVPB  Status:  Discontinued       Note to Pharmacy: Retime as needed   500 mg 250 mL/hr over 60 Minutes Intravenous Every 24 hours 07/13/23 1811 07/14/23 0841   07/14/23 1000  cefTRIAXone (ROCEPHIN) 2 g in sodium chloride 0.9 % 100 mL IVPB       Note to Pharmacy: Retime as needed   2 g 200 mL/hr over 30 Minutes Intravenous Every 24 hours 07/13/23 1811 07/21/23 0959   07/13/23 2200  hydroxychloroquine (PLAQUENIL) tablet 400 mg        400 mg Oral Daily at bedtime 07/13/23 1743     07/13/23 1500  piperacillin-tazobactam (ZOSYN) IVPB 3.375 g        3.375 g 100 mL/hr over 30 Minutes Intravenous  Once 07/13/23 1455 07/13/23 1545   07/13/23 1145  cefTRIAXone (ROCEPHIN) 1 g in sodium chloride 0.9 % 100 mL IVPB        1 g 200 mL/hr over 30 Minutes Intravenous  Once 07/13/23 1136 07/13/23 1229   07/13/23 1145  azithromycin (ZITHROMAX) 500 mg in sodium  chloride 0.9 % 250 mL IVPB        500 mg 250 mL/hr over 60 Minutes Intravenous  Once 07/13/23 1136 07/13/23 1551       Subjective: No new complaints  Objective: Vitals:   07/14/23 0446 07/14/23 1339 07/14/23 2108 07/15/23 0449  BP: (!) 107/58 125/71 125/70 133/68  Pulse: 74 81 68 (!) 58  Resp: 16 16 18 18   Temp: 98.6 F (37 C) 98.6 F (37 C) 98.6 F (37 C) 98.4 F (36.9 C)  TempSrc: Oral Oral Oral Oral  SpO2: 96% 97% 96% 98%  Weight:      Height:        Intake/Output Summary (Last 24 hours) at 07/15/2023 1407 Last data filed at 07/15/2023 0800 Gross per 24 hour  Intake 340 ml  Output --  Net 340 ml   Filed Weights   07/13/23 1016  Weight: 79.4 kg    Examination:  General: No acute distress. Cardiovascular: RRR Lungs: unlabored Abdomen: Soft, nontender, nondistended Neurological: Alert and oriented 3. Moves all extremities 4 with equal strength. Cranial nerves II through XII grossly intact. Extremities: No clubbing or cyanosis. No edema.   Data Reviewed: I have personally reviewed following labs and imaging studies  CBC: Recent Labs  Lab 07/13/23 1023 07/15/23 0535  WBC 12.9* 9.8  NEUTROABS  --  7.2  HGB 11.4* 10.2*  HCT 35.6* 32.8*  MCV 91.5 95.3  PLT 316 287    Basic Metabolic Panel: Recent Labs  Lab 07/13/23 1023 07/14/23 0540 07/15/23 0535  NA 140 136 141  K 3.5 4.1 3.9  CL 105 106 108  CO2 30 25 25   GLUCOSE 108* 153* 120*  BUN 18 19 21   CREATININE 0.84 0.81 0.77  CALCIUM 9.5 8.8* 9.2  MG  --   --  2.1  PHOS  --   --  2.9    GFR: Estimated Creatinine Clearance: 86.8 mL/min (by C-G formula based on SCr of 0.77 mg/dL).  Liver Function Tests: Recent Labs  Lab 07/13/23 1023 07/14/23 0540 07/15/23 0535  AST 26 36 24  ALT 23 43 35  ALKPHOS 50 44 45  BILITOT 0.8 0.8 0.3  PROT 6.0* 5.4* 5.2*  ALBUMIN 3.8 2.8* 2.6*    CBG: No results for input(s): "GLUCAP" in the last 168 hours.   Recent Results (from the past 240 hours)   Resp panel by RT-PCR (RSV, Flu Shaneya Taketa&B, Covid) Anterior Nasal Swab     Status: None   Collection Time: 07/13/23 10:23 AM   Specimen: Anterior Nasal Swab  Result Value Ref Range Status   SARS Coronavirus 2 by RT PCR NEGATIVE NEGATIVE Final    Comment: (NOTE) SARS-CoV-2 target nucleic acids are NOT DETECTED.  The SARS-CoV-2 RNA is generally detectable in upper respiratory specimens during the acute phase of infection. The lowest concentration of SARS-CoV-2 viral copies this assay can detect is 138 copies/mL. Saoirse Legere negative result does not preclude SARS-Cov-2 infection and should not be used as the sole basis for treatment or other patient management decisions. Annisa Mazzarella  negative result may occur with  improper specimen collection/handling, submission of specimen other than nasopharyngeal swab, presence of viral mutation(s) within the areas targeted by this assay, and inadequate number of viral copies(<138 copies/mL). Aleena Kirkeby negative result must be combined with clinical observations, patient history, and epidemiological information. The expected result is Negative.  Fact Sheet for Patients:  BloggerCourse.com  Fact Sheet for Healthcare Providers:  SeriousBroker.it  This test is no t yet approved or cleared by the [place] FDA and  has been authorized for detection and/or diagnosis of SARS-CoV-2 by FDA under an Emergency Use Authorization (EUA). This EUA will remain  in effect (meaning this test can be used) for the duration of the COVID-19 declaration under Section 564(b)(1) of the Act, 21 U.S.C.section 360bbb-3(b)(1), unless the authorization is terminated  or revoked sooner.       Influenza Ciarrah Rae by PCR NEGATIVE NEGATIVE Final   Influenza B by PCR NEGATIVE NEGATIVE Final    Comment: (NOTE) The Xpert Xpress SARS-CoV-2/FLU/RSV plus assay is intended as an aid in the diagnosis of influenza from Nasopharyngeal swab specimens and should not be used  as Ardell Aaronson sole basis for treatment. Nasal washings and aspirates are unacceptable for Xpert Xpress SARS-CoV-2/FLU/RSV testing.  Fact Sheet for Patients: BloggerCourse.com  Fact Sheet for Healthcare Providers: SeriousBroker.it  This test is not yet approved or cleared by the [place] FDA and has been authorized for detection and/or diagnosis of SARS-CoV-2 by FDA under an Emergency Use Authorization (EUA). This EUA will remain in effect (meaning this test can be used) for the duration of the COVID-19 declaration under Section 564(b)(1) of the Act, 21 U.S.C. section 360bbb-3(b)(1), unless the authorization is terminated or revoked.     Resp Syncytial Virus by PCR NEGATIVE NEGATIVE Final    Comment: (NOTE) Fact Sheet for Patients: BloggerCourse.com  Fact Sheet for Healthcare Providers: SeriousBroker.it  This test is not yet approved or cleared by the [place] FDA and has been authorized for detection and/or diagnosis of SARS-CoV-2 by FDA under an Emergency Use Authorization (EUA). This EUA will remain in effect (meaning this test can be used) for the duration of the COVID-19 declaration under Section 564(b)(1) of the Act, 21 U.S.C. section 360bbb-3(b)(1), unless the authorization is terminated or revoked.  Performed at [facility], [address], [place], [place] [address]   Culture, blood (routine x 2)     Status: Abnormal (Preliminary result)   Collection Time: 07/13/23 10:23 AM   Specimen: BLOOD RIGHT ARM  Result Value Ref Range Status   Specimen Description   Final    BLOOD RIGHT ARM Performed at [facility], [address] [place], [place] [address]    Special Requests   Final    BOTTLES DRAWN AEROBIC AND ANAEROBIC Blood Culture results may not be optimal due to an excessive volume of blood received in culture bottles Performed at [facility], [address] [place], [place] [address]    Culture  Setup Time   Final    GRAM NEGATIVE RODS IN BOTH AEROBIC AND ANAEROBIC BOTTLES CRITICAL VALUE NOTED.  VALUE IS CONSISTENT WITH PREVIOUSLY REPORTED AND CALLED VALUE. Performed at [facility], [address], [place], [place] [address]    Culture ESCHERICHIA COLI (Kary Colaizzi)  Final   Report Status PENDING  Incomplete  Culture, blood (routine x 2)     Status: Abnormal (Preliminary result)   Collection Time: 07/13/23 10:37 AM   Specimen: BLOOD RIGHT ARM  Result Value Ref Range Status   Specimen Description   Final    BLOOD RIGHT ARM Performed at [facility], [address] [place], [place] [address]    Special Requests   Final    BOTTLES DRAWN AEROBIC AND ANAEROBIC Blood Culture results may not be optimal due to an excessive volume of blood received in culture bottles Performed at [facility], [address] [place], [place] [address]    Culture  Setup Time   Final    GRAM NEGATIVE RODS ANAEROBIC BOTTLE ONLY CRITICAL RESULT CALLED TO, READ BACK BY AND VERIFIED WITH: PHARMD E JACKSON 07/14/2023 @ 0444 BY AB    Culture (Rusell Meneely)  Final    ESCHERICHIA COLI SUSCEPTIBILITIES TO FOLLOW Performed at [facility], [address] [place], [place] [address]    Report Status PENDING  Incomplete  Blood Culture ID Panel (Reflexed)     Status: Abnormal   Collection Time: 07/13/23 10:37 AM  Result Value Ref Range Status   Enterococcus faecalis NOT DETECTED NOT DETECTED Final   Enterococcus Faecium NOT DETECTED NOT DETECTED Final   Listeria monocytogenes NOT DETECTED NOT DETECTED Final   Staphylococcus species NOT DETECTED NOT DETECTED Final   Staphylococcus aureus (BCID) NOT DETECTED NOT DETECTED Final   Staphylococcus epidermidis NOT DETECTED NOT DETECTED Final   Staphylococcus lugdunensis NOT DETECTED NOT DETECTED Final   Streptococcus species NOT DETECTED NOT DETECTED Final    Streptococcus agalactiae NOT DETECTED NOT DETECTED Final   Streptococcus pneumoniae NOT DETECTED NOT DETECTED Final   Streptococcus pyogenes NOT DETECTED NOT DETECTED Final   Latrece Nitta.calcoaceticus-baumannii NOT DETECTED NOT DETECTED Final   Bacteroides fragilis NOT DETECTED NOT DETECTED Final   Enterobacterales DETECTED (Luwanda Starr) NOT DETECTED Final    Comment: Enterobacterales represent Oley Lahaie large order of gram negative bacteria, not Jesse Hirst single organism. CRITICAL RESULT CALLED TO, READ BACK BY AND VERIFIED WITH: PHARMD E JACKSON 07/14/2023 @ 0444 BY AB    Enterobacter cloacae complex NOT DETECTED NOT DETECTED Final   Escherichia coli DETECTED (Lanett Lasorsa) NOT DETECTED Final    Comment: CRITICAL RESULT CALLED TO, READ BACK BY AND VERIFIED WITH: PHARMD E JACKSON 07/14/2023 @ 0444 BY AB    Klebsiella aerogenes NOT DETECTED NOT DETECTED Final   Klebsiella oxytoca NOT DETECTED NOT DETECTED Final   Klebsiella pneumoniae NOT DETECTED NOT DETECTED Final   Proteus species NOT DETECTED NOT DETECTED Final   Salmonella species NOT DETECTED NOT DETECTED Final   Serratia marcescens NOT DETECTED NOT DETECTED Final   Haemophilus influenzae NOT DETECTED NOT DETECTED Final   Neisseria meningitidis NOT DETECTED NOT DETECTED Final   Pseudomonas aeruginosa NOT DETECTED NOT DETECTED Final   Stenotrophomonas maltophilia NOT DETECTED NOT DETECTED Final   Candida albicans NOT DETECTED NOT DETECTED Final   Candida auris NOT DETECTED NOT DETECTED Final   Candida glabrata NOT DETECTED NOT DETECTED Final   Candida krusei NOT DETECTED NOT DETECTED Final   Candida parapsilosis NOT DETECTED NOT DETECTED Final   Candida tropicalis NOT DETECTED NOT DETECTED Final   Cryptococcus neoformans/gattii NOT DETECTED NOT DETECTED Final   CTX-M ESBL NOT DETECTED NOT DETECTED Final   Carbapenem resistance IMP NOT DETECTED NOT DETECTED Final   Carbapenem resistance KPC NOT DETECTED NOT DETECTED Final   Carbapenem resistance NDM NOT DETECTED NOT DETECTED  Final   Carbapenem resist OXA 48 LIKE NOT DETECTED NOT DETECTED Final   Carbapenem resistance VIM NOT DETECTED NOT DETECTED Final    Comment: Performed at  [facility], [address] [place], [place] [address]  MRSA Next Gen by PCR, Nasal     Status: None   Collection Time: 07/13/23  5:23 PM   Specimen: Nasal Mucosa; Nasal Swab  Result Value Ref Range Status   MRSA by PCR Next Gen NOT DETECTED NOT DETECTED Final    Comment: (NOTE) The GeneXpert MRSA Assay (FDA approved for NASAL specimens only), is one component of Taim Wurm comprehensive MRSA colonization surveillance program. It is not intended to diagnose MRSA infection nor to guide or monitor treatment for MRSA infections. Test performance is not FDA approved in patients less than 33 years old. Performed at [facility], [address], [place], [place] [address]   Respiratory (~20 pathogens) panel by PCR     Status: Abnormal   Collection Time: 07/13/23  5:51 PM   Specimen: Nasopharyngeal Swab; Respiratory  Result Value Ref Range Status   Adenovirus NOT DETECTED NOT DETECTED Final   Coronavirus 229E NOT DETECTED NOT DETECTED Final    Comment: (NOTE) The Coronavirus on the Respiratory Panel, DOES NOT test for the novel  Coronavirus (2019 nCoV)    Coronavirus HKU1 NOT DETECTED NOT DETECTED Final   Coronavirus NL63 NOT DETECTED NOT DETECTED Final   Coronavirus OC43 NOT DETECTED NOT DETECTED Final   Metapneumovirus NOT DETECTED NOT DETECTED Final   Rhinovirus / Enterovirus DETECTED (Shanti Eichel) NOT DETECTED Final   Influenza Rowin Bayron NOT DETECTED NOT DETECTED Final   Influenza B NOT DETECTED NOT DETECTED Final   Parainfluenza Virus 1 NOT DETECTED NOT DETECTED Final   Parainfluenza Virus 2 NOT DETECTED NOT DETECTED Final   Parainfluenza Virus 3 NOT DETECTED NOT DETECTED Final   Parainfluenza Virus 4 NOT DETECTED NOT DETECTED Final   Respiratory Syncytial Virus NOT DETECTED NOT DETECTED Final   Bordetella pertussis NOT  DETECTED NOT DETECTED Final   Bordetella Parapertussis NOT DETECTED NOT DETECTED Final   Chlamydophila pneumoniae NOT DETECTED NOT DETECTED Final   Mycoplasma pneumoniae NOT DETECTED NOT DETECTED Final    Comment: Performed at [facility], [address] [place], [place] [address]  Expectorated Sputum Assessment w Gram Stain, Rflx to Resp Cult     Status: None   Collection Time: 07/13/23  7:42 PM   Specimen: Sputum  Result Value Ref Range Status   Specimen Description SPUTUM  Final   Special Requests NONE  Final   Sputum evaluation   Final    THIS SPECIMEN IS ACCEPTABLE FOR SPUTUM CULTURE Performed at [facility], [address], [place], [place] [address]    Report Status 07/13/2023 FINAL  Final  Culture, Respiratory w Gram Stain     Status: None (Preliminary result)   Collection Time: 07/13/23  7:42 PM   Specimen: SPU  Result Value Ref Range Status   Specimen Description   Final    SPUTUM Performed at [facility], [address], [place], [place] [address]    Special Requests   Final    NONE Reflexed from [phone] Performed at [facility], [address], [place], [place] [address]    Gram Stain   Final    RARE WBC PRESENT, PREDOMINANTLY PMN RARE GRAM POSITIVE COCCI RARE GRAM NEGATIVE RODS    Culture   Final    CULTURE REINCUBATED FOR BETTER GROWTH Performed at [facility], [address], [place], [place] [address]    Report Status PENDING  Incomplete  Gastrointestinal Panel  by PCR , Stool     Status: None   Collection Time: 07/14/23  9:59 AM   Specimen: Stool  Result Value Ref Range Status   Campylobacter species NOT DETECTED NOT DETECTED Final   Plesimonas shigelloides NOT DETECTED NOT DETECTED Final   Salmonella species NOT DETECTED NOT DETECTED Final   Yersinia enterocolitica NOT DETECTED NOT DETECTED Final   Vibrio species NOT DETECTED NOT DETECTED Final   Vibrio cholerae NOT DETECTED NOT  DETECTED Final   Enteroaggregative E coli (EAEC) NOT DETECTED NOT DETECTED Final   Enteropathogenic E coli (EPEC) NOT DETECTED NOT DETECTED Final   Enterotoxigenic E coli (ETEC) NOT DETECTED NOT DETECTED Final   Shiga like toxin producing E coli (STEC) NOT DETECTED NOT DETECTED Final   Shigella/Enteroinvasive E coli (EIEC) NOT DETECTED NOT DETECTED Final   Cryptosporidium NOT DETECTED NOT DETECTED Final   Cyclospora cayetanensis NOT DETECTED NOT DETECTED Final   Entamoeba histolytica NOT DETECTED NOT DETECTED Final   Giardia lamblia NOT DETECTED NOT DETECTED Final   Adenovirus F40/41 NOT DETECTED NOT DETECTED Final   Astrovirus NOT DETECTED NOT DETECTED Final   Norovirus GI/GII NOT DETECTED NOT DETECTED Final   Rotavirus Melissaann Dizdarevic NOT DETECTED NOT DETECTED Final   Sapovirus (I, II, IV, and V) NOT DETECTED NOT DETECTED Final    Comment: Performed at [facility], [address] [place], [place] [address]         Radiology Studies: CT Angio Chest Pulmonary Embolism (PE) W or WO Contrast Result Date: 07/13/2023 CLINICAL DATA:  Fever short of breath EXAM: CT ANGIOGRAPHY CHEST WITH CONTRAST TECHNIQUE: Multidetector CT imaging of the chest was performed using the standard protocol during bolus administration of intravenous contrast. Multiplanar CT image reconstructions and MIPs were obtained to evaluate the vascular anatomy. RADIATION DOSE REDUCTION: This exam was performed according to the departmental dose-optimization program which includes automated exposure control, adjustment of the mA and/or kV according to patient size and/or use of iterative reconstruction technique. CONTRAST:  75mL OMNIPAQUE IOHEXOL 350 MG/ML SOLN COMPARISON:  Chest x-ray 07/13/2023, CT 12/06/2022, 04/11/2018 FINDINGS: Cardiovascular: Satisfactory opacification of the pulmonary arteries to the segmental level. No evidence of pulmonary embolism. Nonaneurysmal aorta. No dissection is seen. Normal cardiac size. Minimal  coronary vascular calcification. No pericardial effusion Mediastinum/Nodes: No enlarged mediastinal, hilar, or axillary lymph nodes. Thyroid gland, trachea, and esophagus demonstrate no significant findings. Lungs/Pleura: Lungs are clear. No pleural effusion or pneumothorax. Multiple bilateral pulmonary nodules are redemonstrated. Right Peri fissural nodule measures 6 mm, previously 6 mm, series 12, image 88. Left lower lobe subpleural pulmonary nodule on series 12, image 111 measures 10 x 7 mm, on 2020 comparison, this measured 10 x 8 mm. No new lung nodule. Dependent atelectasis. Upper Abdomen: Pneumobilia as before.  No acute finding Musculoskeletal: No acute or suspicious osseous abnormality Review of the MIP images confirms the above findings. IMPRESSION: 1. Negative for acute pulmonary embolus. 2. No acute airspace disease 3. No significant change in pulmonary nodules as described above, no specific imaging follow-up is recommended Electronically Signed   By: Jasmine Pang M.D.   On: 07/13/2023 20:50        Scheduled Meds:  atorvastatin  40 mg Oral QHS   calcium-vitamin D  1 tablet Oral Daily   ciprofloxacin-dexamethasone  3 drop Left EAR BID   cyanocobalamin  1,000 mcg Oral Daily   ferrous sulfate  325 mg Oral Q M,W,F   finasteride  5 mg Oral QHS   hydroxychloroquine  400 mg Oral QHS   melatonin  5 mg Oral QHS   pantoprazole  40 mg Oral BID   predniSONE  20 mg Oral Q breakfast   rivaroxaban  20 mg Oral Daily   zinc sulfate (50mg  elemental zinc)  220 mg Oral Daily   Continuous Infusions:  cefTRIAXone (ROCEPHIN)  IV 2 g (07/15/23 0941)     LOS: 1 day    Time spent: over 30 min    Lacretia Nicks, MD [facility]   To contact the attending provider between 7A-7P or the covering provider during after hours 7P-7A, please log into the web site [email or web address] and access using universal [place] password for that web site. If you do not have the password, please call the  hospital operator.  07/15/2023, 2:07 PM

## 2023-07-15 NOTE — Plan of Care (Signed)

## 2023-07-15 NOTE — Telephone Encounter (Signed)
 Returned call to patient's wife. She just wanted Dr. Barron Alvine to be aware that patient is admitted. Records are in epic for review. Patient's wife was not sure if any of this will play a role in patient's recall colonoscopy since surgery. I informed patient that I will have you review records and advise. Thanks

## 2023-07-15 NOTE — Telephone Encounter (Signed)
 Patient wife called and stated that Saturday night her husband was admitted to the ED with a fever of 102.5 and there they had discovered he has e-coli in his blood as well as the Rhino virus. Patient was transferred over to [facility] and he will be there until Tuesday or Wednesday depending on his lab results. Patient wife is requesting a call back. Please advise.

## 2023-07-16 ENCOUNTER — Other Ambulatory Visit (HOSPITAL_COMMUNITY): Payer: Self-pay

## 2023-07-16 ENCOUNTER — Inpatient Hospital Stay (HOSPITAL_COMMUNITY)

## 2023-07-16 DIAGNOSIS — B9620 Unspecified Escherichia coli [E. coli] as the cause of diseases classified elsewhere: Secondary | ICD-10-CM | POA: Diagnosis not present

## 2023-07-16 DIAGNOSIS — R7881 Bacteremia: Secondary | ICD-10-CM | POA: Diagnosis not present

## 2023-07-16 LAB — CBC WITH DIFFERENTIAL/PLATELET
Abs Immature Granulocytes: 0.03 10*3/uL (ref 0.00–0.07)
Basophils Absolute: 0 10*3/uL (ref 0.0–0.1)
Basophils Relative: 0 %
Eosinophils Absolute: 0.1 10*3/uL (ref 0.0–0.5)
Eosinophils Relative: 1 %
HCT: 33.6 % — ABNORMAL LOW (ref 39.0–52.0)
Hemoglobin: 10.6 g/dL — ABNORMAL LOW (ref 13.0–17.0)
Immature Granulocytes: 0 %
Lymphocytes Relative: 16 %
Lymphs Abs: 1.2 10*3/uL (ref 0.7–4.0)
MCH: 29.5 pg (ref 26.0–34.0)
MCHC: 31.5 g/dL (ref 30.0–36.0)
MCV: 93.6 fL (ref 80.0–100.0)
Monocytes Absolute: 0.9 10*3/uL (ref 0.1–1.0)
Monocytes Relative: 13 %
Neutro Abs: 4.9 10*3/uL (ref 1.7–7.7)
Neutrophils Relative %: 70 %
Platelets: 305 10*3/uL (ref 150–400)
RBC: 3.59 MIL/uL — ABNORMAL LOW (ref 4.22–5.81)
RDW: 14.2 % (ref 11.5–15.5)
WBC: 7.1 10*3/uL (ref 4.0–10.5)
nRBC: 0 % (ref 0.0–0.2)

## 2023-07-16 LAB — ECHOCARDIOGRAM COMPLETE
Area-P 1/2: 2.86 cm2
Height: 73 in
S' Lateral: 3.6 cm
Weight: 2800 [oz_av]

## 2023-07-16 LAB — LEGIONELLA PNEUMOPHILA SEROGP 1 UR AG: L. pneumophila Serogp 1 Ur Ag: NEGATIVE

## 2023-07-16 LAB — CULTURE, RESPIRATORY W GRAM STAIN

## 2023-07-16 LAB — CULTURE, BLOOD (ROUTINE X 2)

## 2023-07-16 LAB — COMPREHENSIVE METABOLIC PANEL WITH GFR
ALT: 37 U/L (ref 0–44)
AST: 22 U/L (ref 15–41)
Albumin: 2.6 g/dL — ABNORMAL LOW (ref 3.5–5.0)
Alkaline Phosphatase: 47 U/L (ref 38–126)
Anion gap: 6 (ref 5–15)
BUN: 19 mg/dL (ref 8–23)
CO2: 25 mmol/L (ref 22–32)
Calcium: 9.1 mg/dL (ref 8.9–10.3)
Chloride: 107 mmol/L (ref 98–111)
Creatinine, Ser: 0.74 mg/dL (ref 0.61–1.24)
GFR, Estimated: >60 mL/min (ref >60)
Glucose, Bld: 123 mg/dL — ABNORMAL HIGH (ref 70–99)
Potassium: 3.8 mmol/L (ref 3.5–5.1)
Sodium: 138 mmol/L (ref 135–145)
Total Bilirubin: 0.3 mg/dL (ref 0.0–1.2)
Total Protein: 5.3 g/dL — ABNORMAL LOW (ref 6.5–8.1)

## 2023-07-16 LAB — MAGNESIUM: Magnesium: 2 mg/dL (ref 1.7–2.4)

## 2023-07-16 LAB — PHOSPHORUS: Phosphorus: 3.2 mg/dL (ref 2.5–4.6)

## 2023-07-16 MED ORDER — CEFADROXIL 500 MG PO CAPS
1000.0000 mg | ORAL_CAPSULE | Freq: Two times a day (BID) | ORAL | Status: DC
  Administered 2023-07-16: 1000 mg via ORAL
  Filled 2023-07-16 (×2): qty 2

## 2023-07-16 MED ORDER — CEFADROXIL 500 MG PO CAPS
1000.0000 mg | ORAL_CAPSULE | Freq: Two times a day (BID) | ORAL | 0 refills | Status: DC
  Filled 2023-07-16: qty 20, 5d supply, fill #0

## 2023-07-16 MED ORDER — RIVAROXABAN 20 MG PO TABS
20.0000 mg | ORAL_TABLET | Freq: Every day | ORAL | Status: DC
  Administered 2023-07-16: 20 mg via ORAL
  Filled 2023-07-16: qty 1

## 2023-07-16 MED ORDER — ORAL CARE MOUTH RINSE: 15.0000 mL | OROMUCOSAL | Status: DC | PRN

## 2023-07-16 MED ORDER — CIPROFLOXACIN-DEXAMETHASONE 0.3-0.1 % OT SUSP
3.0000 [drp] | Freq: Two times a day (BID) | OTIC | 0 refills | Status: AC
Start: 2023-07-16 — End: 2023-07-20
  Filled 2023-07-16: qty 7.5, 15d supply, fill #0

## 2023-07-16 MED ORDER — CEFADROXIL 500 MG PO CAPS: 1000.0000 mg | ORAL_CAPSULE | Freq: Two times a day (BID) | ORAL | 0 refills | Status: AC

## 2023-07-16 NOTE — Progress Notes (Signed)
 Mobility Specialist - Progress Note   07/16/23 1112  Mobility  Activity Ambulated independently in hallway  Level of Assistance Independent  Assistive Device None  Distance Ambulated (ft) 1000 ft  Activity Response Tolerated well  Mobility Referral Yes  Mobility visit 1 Mobility  Mobility Specialist Start Time (ACUTE ONLY) 1100  Mobility Specialist Stop Time (ACUTE ONLY) 1111  Mobility Specialist Time Calculation (min) (ACUTE ONLY) 11 min   Pt received in bed and agreeable to mobility. No complaints during session. Pt to bed after session with all needs met.    [facility]

## 2023-07-16 NOTE — TOC Initial Note (Signed)
 Transition of Care [facility]) - Initial/Assessment Note    Patient Details  Name: Stephen Gross MRN: [id number] Date of Birth: 10-Oct-1945  Transition of Care [facility]) CM/SW Contact:    Beckie Busing, RN Phone Number:[phone]  07/16/2023, 3:52 PM  Clinical Narrative:                 TOC following patient admitted from home. Patient is normally independent. No HH or DME needs .Patient does have PCP ([facility]) and follows up on a regular basis. Currently there are no TOC needs.   Expected Discharge Plan: Home/Self Care Barriers to Discharge: No Barriers Identified   Patient Goals and CMS Choice Patient states their goals for this hospitalization and ongoing recovery are:: Re[place] to go home          Expected Discharge Plan and Services In-house Referral: NA Discharge Planning Services: CM Consult Post Acute Care Choice: NA Living arrangements for the past 2 months: Single Family Home Expected Discharge Date: 07/16/23               DME Arranged: N/A DME Agency: NA, [facility]       HH Arranged: NA HH Agency: NA        Prior Living Arrangements/Services Living arrangements for the past 2 months: Single Family Home Lives with:: Spouse Patient language and need for interpreter reviewed:: Yes Do you feel safe going back to the place where you live?: Yes      Need for Family Participation in Patient Care: No (Comment) Care giver support system in place?: Yes (comment) Current home services: Other (comment) (none) Criminal Activity/Legal Involvement Pertinent to Current Situation/Hospitalization: No - Comment as needed  Activities of Daily Living   ADL Screening (condition at time of admission) Independently performs ADLs?: Yes (appropriate for developmental age) Is the patient deaf or have difficulty hearing?: Yes Does the patient have difficulty seeing, even when wearing glasses/contacts?: No Does the patient have difficulty concentrating,  remembering, or making decisions?: Yes  Permission Sought/Granted Permission sought to share information with : Family Supports Permission granted to share information with : Yes, Verbal Permission Granted  Share Information with NAME: Kennan Detter     Permission granted to share info w Relationship: spouse  Permission granted to share info w Contact Information: [phone]  Emotional Assessment Appearance:: Appears stated age Attitude/Demeanor/Rapport: Gracious Affect (typically observed): Pleasant Orientation: : Oriented to Self, Oriented to Place, Oriented to  Time, Oriented to Situation Alcohol / Substance Use: Not Applicable Psych Involvement: No (comment)  Admission diagnosis:  Sepsis (HCC) [A41.9] Vomiting, unspecified vomiting type, unspecified whether nausea present [R11.10] Cough, unspecified type [R05.9] Fever [R50.9] E coli bacteremia [R78.81, B96.20] Patient Active Problem List   Diagnosis Date Noted   E coli bacteremia 07/14/2023   Sepsis (HCC) 07/13/2023   Fever 07/13/2023   Cough 07/13/2023   Systemic inflammatory response syndrome (HCC) 07/13/2023   Generalized abdominal pain 12/09/2022   Partial small bowel obstruction (HCC) 12/09/2022   Long term (current) use of anticoagulants 12/09/2022   Recurrent pulmonary embolism (HCC) 12/09/2022   Benign prostatic hyperplasia with lower urinary tract symptoms 12/09/2022   Chronic kidney disease 12/09/2022   Seronegative rheumatoid arthritis (HCC) 12/09/2022   SBO (small bowel obstruction) (HCC) 12/09/2022   Essential (primary) hypertension 08/16/2022   History of Whipple procedure 08/16/2022   Insulinoma 11/23/2019   Nephrotic syndrome with membranous glomerulonephritis 12/12/2017   Pure hypercholesterolemia 12/12/2017   PCP:  [facility] Pharmacy:  [id number] Rosalita Levan, [place] - [address] AT [facility] [address] [facility] [address] & [address] [place] [address] Phone:  [phone] Fax: [phone]  [facility] [address] [place] [address] Phone: [phone] Fax: [phone]     Social Drivers of Health (SDOH) Social History: SDOH Screenings   Food Insecurity: No Food Insecurity (07/13/2023)  Housing: Low Risk  (07/13/2023)  Transportation Needs: No Transportation Needs (07/13/2023)  Utilities: Not At Risk (07/13/2023)  Financial Resource Strain: Low Risk  (01/29/2023)   Received from [facility]  Social Connections: Socially Integrated (07/13/2023)  Tobacco Use: Medium Risk (07/13/2023)   SDOH Interventions:     Readmission Risk Interventions    07/16/2023    3:46 PM  Readmission Risk Prevention Plan  Transportation Screening Complete  Home Care Screening Complete  Medication Review (RN CM) Referral to Pharmacy

## 2023-07-16 NOTE — Progress Notes (Signed)
  Echocardiogram 2D Echocardiogram has been performed.  Leda Roys RDCS 07/16/2023, 2:45 PM

## 2023-07-16 NOTE — Plan of Care (Signed)

## 2023-07-16 NOTE — Plan of Care (Signed)
 Patient is adequate for discharge. Verbalized understanding, vitals stable. Peripheral IV's removed from both arms. Wife is transporting patient home.  Problem: Education: Goal: Knowledge of General Education information will improve Description: Including pain rating scale, medication(s)/side effects and non-pharmacologic comfort measures Outcome: Adequate for Discharge   Problem: Health Behavior/Discharge Planning: Goal: Ability to manage health-related needs will improve Outcome: Adequate for Discharge   Problem: Clinical Measurements: Goal: Ability to maintain clinical measurements within normal limits will improve Outcome: Adequate for Discharge Goal: Will remain free from infection Outcome: Adequate for Discharge Goal: Diagnostic test results will improve Outcome: Adequate for Discharge Goal: Respiratory complications will improve Outcome: Adequate for Discharge Goal: Cardiovascular complication will be avoided Outcome: Adequate for Discharge   Problem: Activity: Goal: Risk for activity intolerance will decrease Outcome: Adequate for Discharge   Problem: Nutrition: Goal: Adequate nutrition will be maintained Outcome: Adequate for Discharge   Problem: Coping: Goal: Level of anxiety will decrease Outcome: Adequate for Discharge   Problem: Elimination: Goal: Will not experience complications related to bowel motility Outcome: Adequate for Discharge Goal: Will not experience complications related to urinary retention Outcome: Adequate for Discharge   Problem: Pain Managment: Goal: General experience of comfort will improve and/or be controlled Outcome: Adequate for Discharge   Problem: Safety: Goal: Ability to remain free from injury will improve Outcome: Adequate for Discharge   Problem: Skin Integrity: Goal: Risk for impaired skin integrity will decrease Outcome: Adequate for Discharge   Problem: Activity: Goal: Ability to tolerate increased activity will  improve Outcome: Adequate for Discharge   Problem: Clinical Measurements: Goal: Ability to maintain a body temperature in the normal range will improve Outcome: Adequate for Discharge   Problem: Respiratory: Goal: Ability to maintain adequate ventilation will improve Outcome: Adequate for Discharge Goal: Ability to maintain a clear airway will improve Outcome: Adequate for Discharge

## 2023-07-16 NOTE — Discharge Summary (Signed)
 Physician Discharge Summary  Stephen Gross ZOX:[id number] DOB: 11-Jan-1946 DOA: 07/13/2023  PCP: [facility]  Admit date: 07/13/2023 Discharge date: 07/16/2023  Time spent: 40 minutes  Recommendations for Outpatient Follow-up:  Follow outpatient CBC/CMP  Follow with infectious disease outpatient  Follow with advanced GI at [facility] outpatient (per his outpatient GI providers) Continue to discuss with outpatient providers his recurrent fevers and multiple episodes of e. Coli bacteremia -> related to post surgical anatomy and reflux cholangitis? Or other predisposing cause.  Discharge Diagnoses:  Principal Problem:   Sepsis (HCC) Active Problems:   Fever   Cough   Systemic inflammatory response syndrome (HCC)   E coli bacteremia   Discharge Condition: stable  Diet recommendation: heart healthy  Filed Weights   07/13/23 1016  Weight: 79.4 kg    History of present illness:   Stephen Gross is Stephen Gross 78 y.o. male with Stephen Gross complex medical history including insulinoma s/p whipple in 2003, hx PE x2 on xarelto, hx colon cancer s/p colectomy (01/2023), low grade neuroendocrine tumor followed by oncology, membranous glomerulonephritis, rheumatoid arthritis, and other medical issues here with fevers, chills, and malaise.    He's been found to have e. Coli bacteremia.  He's improved on antibiotics, stable at this time for discharge.  Needs outpatient follow up with ID, GI, and surgery given his history of recurrent fevers and multiple episodes of e. Coli bacteremia.   See below for additional details  Hospital Course:  Assessment and Plan:  Sepsis due to E. Coli Bacteremia Fever, leukocytosis.  E. Coli bacteremia. UA not consistent with UTI No clear source.  CT C/Antanasia Kaczynski/P without acute or inflammatory process in the abdomen.  No acute airspace disease. In the past, his e. Coli bacteremia has been presumed to be possibly related to reflux cholangitis in the context of his surgical anatomy  (s/p whipple) Notably, post whipple, he'd had unexplained fevers (body aches, malaise) for years after his whipple  Wean steroids from sick dosing Discharge on duricef  Urine strep negative, urine legionella negative, MRSA PCR negative echo Sputum culture pending with no resp flora, blood cultures with e. coli RVP notable for rhinovirus.  Negative covid, influenza, RSV.   Upper Respiratory Infection Rhinovirus positive Symptomatic management   Nausea  Vomiting  Diarrhea Likely due to e. Coli bacteremia Follow c diff (not done) and GI pathogen panel (negative) CT abd/pelvis without acute process (notable for chronic post op changes, severe diverticulosis, indeterminate circumferential thickening of rectum - artifact? Rectal exam deferred for the time being with unimpressive abdominal exam and suspicion that symptoms may be due to #1)   Ear Drainage possible otitis externa.  Will continue abx drops.   History of E. Coli Bacteremia On multiple occasions, following blood cultures (as above)   Iron Def Anemia Continue iron    Hx Insulinoma s/p Whipple In 2003   Stage 1 Colon Cancer Low Grade Neuroendocrine Cancer Follows with oncology at [facility] S/p right colectomy 01/21/2023   Membranous Glomerulonephritis Hx Nephrotic Syndrome UA notable for 100 mg/dl protein Noted  Seronegative Rheumatoid Arthritis Plaquenil, prednisone Was previously on rituximab - this is currently on hold    History Recurrent PE Continue xarelto     Procedures: Echo IMPRESSIONS     1. Left ventricular ejection fraction, by estimation, is 60 to 65%. The  left ventricle has normal function. The left ventricle has no regional  wall motion abnormalities. Left ventricular diastolic parameters are  consistent with Grade I diastolic  dysfunction (impaired relaxation).  2. Right ventricular systolic function is normal. The right ventricular  size is normal.   3. The mitral valve is normal in  structure. No evidence of mitral valve  regurgitation. No evidence of mitral stenosis.   4. The aortic valve is tricuspid. There is mild thickening of the aortic  valve. Aortic valve regurgitation is not visualized. No aortic stenosis is  present.   5. The inferior vena cava is normal in size with greater than 50%  respiratory variability, suggesting right atrial pressure of 3 mmHg.   Comparison(s): No prior Echocardiogram.   Conclusion(s)/Recommendation(s): No evidence of valvular vegetations on  this transthoracic echocardiogram. Consider Alsie Younes transesophageal  echocardiogram to exclude infective endocarditis if clinically indicated.    Consultations: none  Discharge Exam: Vitals:   07/16/23 0636 07/16/23 1357  BP: (!) 152/75 (!) 141/75  Pulse: (!) 55 68  Resp: 20   Temp: 97.6 F (36.4 C) 97.7 F (36.5 C)  SpO2: 98% 97%   No complaints Feeling better Discussed discharge plan and need for outpatient follow up  General: No acute distress. Cardiovascular: RRR Lungs: unlabored Abdomen: Soft, nontender, nondistended  Neurological: Alert and oriented 3. Moves all extremities 4 with equal strength. Cranial nerves II through XII grossly intact. Extremities: No clubbing or cyanosis. No edema.   Discharge Instructions   Discharge Instructions     Ambulatory referral to Infectious Disease   Complete by: As directed    Call MD for:  difficulty breathing, headache or visual disturbances   Complete by: As directed    Call MD for:  extreme fatigue   Complete by: As directed    Call MD for:  hives   Complete by: As directed    Call MD for:  persistant dizziness or light-headedness   Complete by: As directed    Call MD for:  persistant nausea and vomiting   Complete by: As directed    Call MD for:  redness, tenderness, or signs of infection (pain, swelling, redness, odor or green/yellow discharge around incision site)   Complete by: As directed    Call MD for:  severe  uncontrolled pain   Complete by: As directed    Call MD for:  temperature >100.4   Complete by: As directed    Diet - low sodium heart healthy   Complete by: As directed    Discharge instructions   Complete by: As directed    You were seen for e. Coli bacteremia (e. Coli in the blood).  The source is unclear.  Your ultrasound of your heart was reassuring.  In the past, there have been discussions of possible relationship to your post surgical anatomy and possible reflux cholangitis.  This is Brandyce Dimario reasonable hypothesis and warrants additional discussion with Braelin Brosch whipple surgeon and gastroenterologist outpatient.  I'll also send you to infectious disease.  Return for new, recurrent, or worsening symptoms.  Please ask your PCP to request records from this hospitalization so they know what was done and what the next steps will be.   Increase activity slowly   Complete by: As directed       Allergies as of 07/16/2023       Reactions   Lisinopril Cough   Nsaids    Bleeding internally        Medication List     TAKE these medications    acetaminophen 650 MG CR tablet Commonly known as: TYLENOL Take 1,300 mg by mouth in the morning and at bedtime.   amLODipine  5 MG tablet Commonly known as: NORVASC Take 5 mg by mouth at bedtime.   ascorbic acid 500 MG tablet Commonly known as: VITAMIN C Take 500 mg by mouth in the morning.   atorvastatin 40 MG tablet Commonly known as: LIPITOR Take 40 mg by mouth at bedtime.   B-12 1000 MCG Tabs Take 1 tablet by mouth in the morning.   CALCIUM 600 + D PO Take 1 capsule by mouth in the morning.   cefadroxil 500 MG capsule Commonly known as: DURICEF Take 2 capsules (1,000 mg total) by mouth 2 (two) times daily for 5 days.   cholecalciferol 25 MCG (1000 UNIT) tablet Commonly known as: VITAMIN D3 Take 1,000 Units by mouth in the morning.   ciprofloxacin-dexamethasone OTIC suspension Commonly known as: CIPRODEX Place 3 drops into the  left ear 2 (two) times daily for 4 days.   ferrous sulfate 325 (65 FE) MG tablet Take 325 mg by mouth every Monday, Wednesday, and Friday.   finasteride 5 MG tablet Commonly known as: PROSCAR Take 5 mg by mouth at bedtime.   furosemide 20 MG tablet Commonly known as: LASIX Take 20 mg by mouth as needed for fluid.   hydroxychloroquine 200 MG tablet Commonly known as: PLAQUENIL Take 400 mg by mouth at bedtime.   losartan 100 MG tablet Commonly known as: COZAAR Take 100 mg by mouth at bedtime.   Melatonin 5 MG Caps Take 1 capsule by mouth daily.   pantoprazole 40 MG tablet Commonly known as: PROTONIX TAKE 1 TABLET(40 MG) BY MOUTH TWICE DAILY What changed: See the new instructions.   predniSONE 5 MG tablet Commonly known as: DELTASONE Take 5 mg by mouth daily with breakfast.   senna 8.6 MG tablet Commonly known as: SENOKOT Take 1 tablet by mouth at bedtime.   Xarelto 20 MG Tabs tablet Generic drug: rivaroxaban Take 20 mg by mouth in the morning.   zinc gluconate 50 MG tablet Take 50 mg by mouth in the morning.       Allergies  Allergen Reactions   Lisinopril Cough   Nsaids     Bleeding internally      The results of significant diagnostics from this hospitalization (including imaging, microbiology, ancillary and laboratory) are listed below for reference.    Significant Diagnostic Studies: ECHOCARDIOGRAM COMPLETE Result Date: 07/16/2023    ECHOCARDIOGRAM REPORT   Patient Name:   Stephen Gross Date of Exam: 07/16/2023 Medical Rec #:  [id number]     Height:       73.0 in Accession #:    [id number]    Weight:       175.0 lb Date of Birth:  04-06-1946     BSA:          2.033 m Patient Age:    78 years      BP:           152/75 mmHg Patient Gender: M             HR:           60 bpm. Exam Location:  Inpatient Procedure: 2D Echo, Color Doppler and Cardiac Doppler (Both Spectral and Color            Flow Doppler were utilized during procedure). Indications:    Bacteremia  R78.81  History:        Patient has no prior history of Echocardiogram examinations.  Sonographer:    Harriette Bouillon RDCS Referring Phys: [phone] Graham Hyun CALDWELL Shaune Spittle  IMPRESSIONS  1. Left ventricular ejection fraction, by estimation, is 60 to 65%. The left ventricle has normal function. The left ventricle has no regional wall motion abnormalities. Left ventricular diastolic parameters are consistent with Grade I diastolic dysfunction (impaired relaxation).  2. Right ventricular systolic function is normal. The right ventricular size is normal.  3. The mitral valve is normal in structure. No evidence of mitral valve regurgitation. No evidence of mitral stenosis.  4. The aortic valve is tricuspid. There is mild thickening of the aortic valve. Aortic valve regurgitation is not visualized. No aortic stenosis is present.  5. The inferior vena cava is normal in size with greater than 50% respiratory variability, suggesting right atrial pressure of 3 mmHg. Comparison(s): No prior Echocardiogram. Conclusion(s)/Recommendation(s): No evidence of valvular vegetations on this transthoracic echocardiogram. Consider Jerusalen Mateja transesophageal echocardiogram to exclude infective endocarditis if clinically indicated. FINDINGS  Left Ventricle: Left ventricular ejection fraction, by estimation, is 60 to 65%. The left ventricle has normal function. The left ventricle has no regional wall motion abnormalities. Strain was performed and the global longitudinal strain is indeterminate. The left ventricular internal cavity size was normal in size. There is no left ventricular hypertrophy. Left ventricular diastolic parameters are consistent with Grade I diastolic dysfunction (impaired relaxation). Right Ventricle: The right ventricular size is normal. No increase in right ventricular wall thickness. Right ventricular systolic function is normal. Left Atrium: Left atrial size was normal in size. Right Atrium: Right atrial size was normal in size.  Pericardium: There is no evidence of pericardial effusion. Mitral Valve: The mitral valve is normal in structure. No evidence of mitral valve regurgitation. No evidence of mitral valve stenosis. Tricuspid Valve: The tricuspid valve is normal in structure. Tricuspid valve regurgitation is trivial. No evidence of tricuspid stenosis. Aortic Valve: The aortic valve is tricuspid. There is mild thickening of the aortic valve. Aortic valve regurgitation is not visualized. No aortic stenosis is present. Pulmonic Valve: The pulmonic valve was not well visualized. Pulmonic valve regurgitation is not visualized. No evidence of pulmonic stenosis. Aorta: The aortic root, ascending aorta and aortic arch are all structurally normal, with no evidence of dilitation or obstruction. Venous: The inferior vena cava is normal in size with greater than 50% respiratory variability, suggesting right atrial pressure of 3 mmHg. IAS/Shunts: The atrial septum is grossly normal. Additional Comments: 3D was performed not requiring image post processing on an independent workstation and was indeterminate.  LEFT VENTRICLE PLAX 2D LVIDd:         5.60 cm   Diastology LVIDs:         3.60 cm   LV e' medial:    7.29 cm/s LV PW:         0.90 cm   LV E/e' medial:  9.8 LV IVS:        0.80 cm   LV e' lateral:   9.36 cm/s LVOT diam:     2.20 cm   LV E/e' lateral: 7.6 LV SV:         115 LV SV Index:   56 LVOT Area:     3.80 cm  RIGHT VENTRICLE             IVC RV S prime:     17.50 cm/s  IVC diam: 1.60 cm TAPSE (M-mode): 2.9 cm LEFT ATRIUM             Index        RIGHT ATRIUM  Index LA diam:        3.70 cm 1.82 cm/m   RA Area:     16.90 cm LA Vol (A2C):   53.2 ml 26.17 ml/m  RA Volume:   49.60 ml  24.40 ml/m LA Vol (A4C):   59.4 ml 29.22 ml/m LA Biplane Vol: 60.5 ml 29.76 ml/m  AORTIC VALVE LVOT Vmax:   139.00 cm/s LVOT Vmean:  90.300 cm/s LVOT VTI:    0.302 m  AORTA Ao Root diam: 3.30 cm Ao Asc diam:  3.20 cm MITRAL VALVE MV Area (PHT): 2.86  cm    SHUNTS MV E velocity: 71.10 cm/s  Systemic VTI:  0.30 m MV Mashelle Busick velocity: 96.80 cm/s  Systemic Diam: 2.20 cm MV E/Eyad Rochford ratio:  0.73 Riley Lam MD Electronically signed by Riley Lam MD Signature Date/Time: 07/16/2023/3:26:37 PM    Final    CT Angio Chest Pulmonary Embolism (PE) W or WO Contrast Result Date: 07/13/2023 CLINICAL DATA:  Fever short of breath EXAM: CT ANGIOGRAPHY CHEST WITH CONTRAST TECHNIQUE: Multidetector CT imaging of the chest was performed using the standard protocol during bolus administration of intravenous contrast. Multiplanar CT image reconstructions and MIPs were obtained to evaluate the vascular anatomy. RADIATION DOSE REDUCTION: This exam was performed according to the departmental dose-optimization program which includes automated exposure control, adjustment of the mA and/or kV according to patient size and/or use of iterative reconstruction technique. CONTRAST:  75mL OMNIPAQUE IOHEXOL 350 MG/ML SOLN COMPARISON:  Chest x-ray 07/13/2023, CT 12/06/2022, 04/11/2018 FINDINGS: Cardiovascular: Satisfactory opacification of the pulmonary arteries to the segmental level. No evidence of pulmonary embolism. Nonaneurysmal aorta. No dissection is seen. Normal cardiac size. Minimal coronary vascular calcification. No pericardial effusion Mediastinum/Nodes: No enlarged mediastinal, hilar, or axillary lymph nodes. Thyroid gland, trachea, and esophagus demonstrate no significant findings. Lungs/Pleura: Lungs are clear. No pleural effusion or pneumothorax. Multiple bilateral pulmonary nodules are redemonstrated. Right Peri fissural nodule measures 6 mm, previously 6 mm, series 12, image 88. Left lower lobe subpleural pulmonary nodule on series 12, image 111 measures 10 x 7 mm, on 2020 comparison, this measured 10 x 8 mm. No new lung nodule. Dependent atelectasis. Upper Abdomen: Pneumobilia as before.  No acute finding Musculoskeletal: No acute or suspicious osseous abnormality Review  of the MIP images confirms the above findings. IMPRESSION: 1. Negative for acute pulmonary embolus. 2. No acute airspace disease 3. No significant change in pulmonary nodules as described above, no specific imaging follow-up is recommended Electronically Signed   By: Jasmine Pang M.D.   On: 07/13/2023 20:50   CT ABDOMEN PELVIS W CONTRAST Result Date: 07/13/2023 CLINICAL DATA:  78 year old male with acute abdominal pain. Previous Whipple in 2002. EXAM: CT ABDOMEN AND PELVIS WITH CONTRAST TECHNIQUE: Multidetector CT imaging of the abdomen and pelvis was performed using the standard protocol following bolus administration of intravenous contrast. RADIATION DOSE REDUCTION: This exam was performed according to the departmental dose-optimization program which includes automated exposure control, adjustment of the mA and/or kV according to patient size and/or use of iterative reconstruction technique. CONTRAST:  OMNIPAQUE IOHEXOL 300 MG/ML  SOLN COMPARISON:  Noncontrast CT Abdomen and Pelvis 12/09/2022. FINDINGS: Lower chest: Negative. Hepatobiliary: Chronic Whipple. Small volume pneumobilia has decreased since last year. Liver is otherwise within normal limits. Pancreas: Surgically absent pancreatic head. Body and tail appear within normal limits. Spleen: Diminutive, negative. Adrenals/Urinary Tract: Normal adrenal glands. Kidneys are nonobstructed. Symmetric renal enhancement. Normal contrast excretion on the delayed images. Diminutive ureters. No nephrolithiasis. Circumferential bladder  wall thickening is mild to moderate with bilateral small bladder diverticula (series 2, image 64). Stomach/Bowel: Circumferential thickening of the rectum on series 2, image 75 with no convincing adjacent mesenteric inflammation, might be under distension artifact. Extensive diverticulosis throughout the upstream sigmoid, descending colon. Chronic right hemicolectomy. Mid abdominal bowel anastomosis with no adverse features. No  dilated small bowel. Superimposed chronic Whipple, gastrojejunostomy. Decompressed stomach. No pneumoperitoneum. No free air or convincing acute mesenteric inflammation. Vascular/Lymphatic: Minimal calcified atherosclerosis, generalized arterial tortuosity in the abdomen and pelvis. Negative for abdominal aortic aneurysm. Major arterial structures and the portal venous system appear to be patent. No lymphadenopathy identified. Reproductive: Prostatomegaly. Small fat containing umbilical hernia is chronic and stable. Other: No pelvis free fluid. Musculoskeletal: No acute osseous abnormality identified. Mild for age spine degeneration, hyperostosis. IMPRESSION: 1. Indeterminate circumferential thickening of the rectum, might be artifact from under distension. Digital rectal exam might be valuable. 2. Extensive chronic postoperative changes in the abdomen. No bowel obstruction. Chronic severe diverticulosis of the residual large bowel, but no active inflammation of those segments. 3. No other acute or inflammatory process identified in the abdomen or pelvis. Electronically Signed   By: Odessa Fleming M.D.   On: 07/13/2023 12:18   DG Chest Port 1 View Result Date: 07/13/2023 CLINICAL DATA:  Cough.  Fever.  Vomiting. EXAM: PORTABLE CHEST 1 VIEW COMPARISON:  One view chest x-ray FINDINGS: The heart size and mediastinal contours are within normal limits. Both lungs are clear. The visualized skeletal structures are unremarkable. IMPRESSION: Negative one-view chest x-ray Electronically Signed   By: Marin Roberts M.D.   On: 07/13/2023 11:47    Microbiology: Recent Results (from the past 240 hours)  Resp panel by RT-PCR (RSV, Flu Kameshia Madruga&B, Covid) Anterior Nasal Swab     Status: None   Collection Time: 07/13/23 10:23 AM   Specimen: Anterior Nasal Swab  Result Value Ref Range Status   SARS Coronavirus 2 by RT PCR NEGATIVE NEGATIVE Final    Comment: (NOTE) SARS-CoV-2 target nucleic acids are NOT DETECTED.  The  SARS-CoV-2 RNA is generally detectable in upper respiratory specimens during the acute phase of infection. The lowest concentration of SARS-CoV-2 viral copies this assay can detect is 138 copies/mL. Apphia Cropley negative result does not preclude SARS-Cov-2 infection and should not be used as the sole basis for treatment or other patient management decisions. Ennis Heavner negative result may occur with  improper specimen collection/handling, submission of specimen other than nasopharyngeal swab, presence of viral mutation(s) within the areas targeted by this assay, and inadequate number of viral copies(<138 copies/mL). Jeanee Fabre negative result must be combined with clinical observations, patient history, and epidemiological information. The expected result is Negative.  Fact Sheet for Patients:  BloggerCourse.com  Fact Sheet for Healthcare Providers:  SeriousBroker.it  This test is no t yet approved or cleared by the [place] FDA and  has been authorized for detection and/or diagnosis of SARS-CoV-2 by FDA under an Emergency Use Authorization (EUA). This EUA will remain  in effect (meaning this test can be used) for the duration of the COVID-19 declaration under Section 564(b)(1) of the Act, 21 U.S.C.section 360bbb-3(b)(1), unless the authorization is terminated  or revoked sooner.       Influenza Lafreda Casebeer by PCR NEGATIVE NEGATIVE Final   Influenza B by PCR NEGATIVE NEGATIVE Final    Comment: (NOTE) The Xpert Xpress SARS-CoV-2/FLU/RSV plus assay is intended as an aid in the diagnosis of influenza from Nasopharyngeal swab specimens and should not be used as  Yaman Grauberger sole basis for treatment. Nasal washings and aspirates are unacceptable for Xpert Xpress SARS-CoV-2/FLU/RSV testing.  Fact Sheet for Patients: BloggerCourse.com  Fact Sheet for Healthcare Providers: SeriousBroker.it  This test is not yet approved or  cleared by the [place] FDA and has been authorized for detection and/or diagnosis of SARS-CoV-2 by FDA under an Emergency Use Authorization (EUA). This EUA will remain in effect (meaning this test can be used) for the duration of the COVID-19 declaration under Section 564(b)(1) of the Act, 21 U.S.C. section 360bbb-3(b)(1), unless the authorization is terminated or revoked.     Resp Syncytial Virus by PCR NEGATIVE NEGATIVE Final    Comment: (NOTE) Fact Sheet for Patients: BloggerCourse.com  Fact Sheet for Healthcare Providers: SeriousBroker.it  This test is not yet approved or cleared by the [place] FDA and has been authorized for detection and/or diagnosis of SARS-CoV-2 by FDA under an Emergency Use Authorization (EUA). This EUA will remain in effect (meaning this test can be used) for the duration of the COVID-19 declaration under Section 564(b)(1) of the Act, 21 U.S.C. section 360bbb-3(b)(1), unless the authorization is terminated or revoked.  Performed at [facility], [address] [place], [place] [address]   Culture, blood (routine x 2)     Status: Abnormal   Collection Time: 07/13/23 10:23 AM   Specimen: BLOOD RIGHT ARM  Result Value Ref Range Status   Specimen Description   Final    BLOOD RIGHT ARM Performed at [facility], [address], [place], [place] [address]    Special Requests   Final    BOTTLES DRAWN AEROBIC AND ANAEROBIC Blood Culture results may not be optimal due to an excessive volume of blood received in culture bottles Performed at [facility], [address], [place], [place] [address]    Culture  Setup Time   Final    GRAM NEGATIVE RODS IN BOTH AEROBIC AND ANAEROBIC BOTTLES CRITICAL VALUE NOTED.  VALUE IS CONSISTENT WITH PREVIOUSLY REPORTED AND CALLED VALUE.    Culture (Maryella Abood)  Final    ESCHERICHIA COLI SUSCEPTIBILITIES PERFORMED ON  PREVIOUS CULTURE WITHIN THE LAST 5 DAYS. Performed at [facility], [address] [place], [place] [address]    Report Status 07/16/2023 FINAL  Final  Culture, blood (routine x 2)     Status: Abnormal   Collection Time: 07/13/23 10:37 AM   Specimen: BLOOD RIGHT ARM  Result Value Ref Range Status   Specimen Description   Final    BLOOD RIGHT ARM Performed at [facility], [address], [place], [place] [address]    Special Requests   Final    BOTTLES DRAWN AEROBIC AND ANAEROBIC Blood Culture results may not be optimal due to an excessive volume of blood received in culture bottles Performed at [facility], [address] [place], [place] [address]    Culture  Setup Time   Final    GRAM NEGATIVE RODS ANAEROBIC BOTTLE ONLY CRITICAL RESULT CALLED TO, READ BACK BY AND VERIFIED WITH: PHARMD E JACKSON 07/14/2023 @ 0444 BY AB Performed at [facility], [address], [place], [place] [address]    Culture ESCHERICHIA COLI (Juli Odom)  Final   Report Status 07/16/2023 FINAL  Final   Organism ID, Bacteria ESCHERICHIA COLI  Final   Organism ID, Bacteria ESCHERICHIA COLI  Final      Susceptibility   Escherichia coli - KIRBY BAUER*    CEFAZOLIN SENSITIVE Sensitive  Escherichia coli - MIC*    AMPICILLIN 4 SENSITIVE Sensitive     CEFEPIME <=0.12 SENSITIVE Sensitive     CEFTAZIDIME <=1 SENSITIVE Sensitive     CEFTRIAXONE <=0.25 SENSITIVE Sensitive     CIPROFLOXACIN <=0.25 SENSITIVE Sensitive     GENTAMICIN <=1 SENSITIVE Sensitive     IMIPENEM <=0.25 SENSITIVE Sensitive     TRIMETH/SULFA <=20 SENSITIVE Sensitive     AMPICILLIN/SULBACTAM <=2 SENSITIVE Sensitive     PIP/TAZO <=4 SENSITIVE Sensitive ug/mL    * ESCHERICHIA COLI    ESCHERICHIA COLI  Blood Culture ID Panel (Reflexed)     Status: Abnormal   Collection Time: 07/13/23 10:37 AM  Result Value Ref Range Status   Enterococcus faecalis NOT DETECTED NOT DETECTED Final   Enterococcus Faecium NOT  DETECTED NOT DETECTED Final   Listeria monocytogenes NOT DETECTED NOT DETECTED Final   Staphylococcus species NOT DETECTED NOT DETECTED Final   Staphylococcus aureus (BCID) NOT DETECTED NOT DETECTED Final   Staphylococcus epidermidis NOT DETECTED NOT DETECTED Final   Staphylococcus lugdunensis NOT DETECTED NOT DETECTED Final   Streptococcus species NOT DETECTED NOT DETECTED Final   Streptococcus agalactiae NOT DETECTED NOT DETECTED Final   Streptococcus pneumoniae NOT DETECTED NOT DETECTED Final   Streptococcus pyogenes NOT DETECTED NOT DETECTED Final   Orazio Weller.calcoaceticus-baumannii NOT DETECTED NOT DETECTED Final   Bacteroides fragilis NOT DETECTED NOT DETECTED Final   Enterobacterales DETECTED (Haydan Mansouri) NOT DETECTED Final    Comment: Enterobacterales represent Verdean Murin large order of gram negative bacteria, not Shaheen Star single organism. CRITICAL RESULT CALLED TO, READ BACK BY AND VERIFIED WITH: PHARMD E JACKSON 07/14/2023 @ 0444 BY AB    Enterobacter cloacae complex NOT DETECTED NOT DETECTED Final   Escherichia coli DETECTED (Mithcell Schumpert) NOT DETECTED Final    Comment: CRITICAL RESULT CALLED TO, READ BACK BY AND VERIFIED WITH: PHARMD E JACKSON 07/14/2023 @ 0444 BY AB    Klebsiella aerogenes NOT DETECTED NOT DETECTED Final   Klebsiella oxytoca NOT DETECTED NOT DETECTED Final   Klebsiella pneumoniae NOT DETECTED NOT DETECTED Final   Proteus species NOT DETECTED NOT DETECTED Final   Salmonella species NOT DETECTED NOT DETECTED Final   Serratia marcescens NOT DETECTED NOT DETECTED Final   Haemophilus influenzae NOT DETECTED NOT DETECTED Final   Neisseria meningitidis NOT DETECTED NOT DETECTED Final   Pseudomonas aeruginosa NOT DETECTED NOT DETECTED Final   Stenotrophomonas maltophilia NOT DETECTED NOT DETECTED Final   Candida albicans NOT DETECTED NOT DETECTED Final   Candida auris NOT DETECTED NOT DETECTED Final   Candida glabrata NOT DETECTED NOT DETECTED Final   Candida krusei NOT DETECTED NOT DETECTED Final    Candida parapsilosis NOT DETECTED NOT DETECTED Final   Candida tropicalis NOT DETECTED NOT DETECTED Final   Cryptococcus neoformans/gattii NOT DETECTED NOT DETECTED Final   CTX-M ESBL NOT DETECTED NOT DETECTED Final   Carbapenem resistance IMP NOT DETECTED NOT DETECTED Final   Carbapenem resistance KPC NOT DETECTED NOT DETECTED Final   Carbapenem resistance NDM NOT DETECTED NOT DETECTED Final   Carbapenem resist OXA 48 LIKE NOT DETECTED NOT DETECTED Final   Carbapenem resistance VIM NOT DETECTED NOT DETECTED Final    Comment: Performed at [facility], [address], [place], [place] [address]  MRSA Next Gen by PCR, Nasal     Status: None   Collection Time: 07/13/23  5:23 PM   Specimen: Nasal Mucosa; Nasal Swab  Result Value Ref Range Status   MRSA by PCR Next Gen NOT DETECTED NOT  DETECTED Final    Comment: (NOTE) The GeneXpert MRSA Assay (FDA approved for NASAL specimens only), is one component of Tresean Mattix comprehensive MRSA colonization surveillance program. It is not intended to diagnose MRSA infection nor to guide or monitor treatment for MRSA infections. Test performance is not FDA approved in patients less than 43 years old. Performed at [facility], [address], [place], [place] [address]   Respiratory (~20 pathogens) panel by PCR     Status: Abnormal   Collection Time: 07/13/23  5:51 PM   Specimen: Nasopharyngeal Swab; Respiratory  Result Value Ref Range Status   Adenovirus NOT DETECTED NOT DETECTED Final   Coronavirus 229E NOT DETECTED NOT DETECTED Final    Comment: (NOTE) The Coronavirus on the Respiratory Panel, DOES NOT test for the novel  Coronavirus (2019 nCoV)    Coronavirus HKU1 NOT DETECTED NOT DETECTED Final   Coronavirus NL63 NOT DETECTED NOT DETECTED Final   Coronavirus OC43 NOT DETECTED NOT DETECTED Final   Metapneumovirus NOT DETECTED NOT DETECTED Final   Rhinovirus / Enterovirus DETECTED (Konner Saiz) NOT DETECTED Final   Influenza Jamaria Amborn NOT  DETECTED NOT DETECTED Final   Influenza B NOT DETECTED NOT DETECTED Final   Parainfluenza Virus 1 NOT DETECTED NOT DETECTED Final   Parainfluenza Virus 2 NOT DETECTED NOT DETECTED Final   Parainfluenza Virus 3 NOT DETECTED NOT DETECTED Final   Parainfluenza Virus 4 NOT DETECTED NOT DETECTED Final   Respiratory Syncytial Virus NOT DETECTED NOT DETECTED Final   Bordetella pertussis NOT DETECTED NOT DETECTED Final   Bordetella Parapertussis NOT DETECTED NOT DETECTED Final   Chlamydophila pneumoniae NOT DETECTED NOT DETECTED Final   Mycoplasma pneumoniae NOT DETECTED NOT DETECTED Final    Comment: Performed at [facility], [address], [place], [place] [address]  Expectorated Sputum Assessment w Gram Stain, Rflx to Resp Cult     Status: None   Collection Time: 07/13/23  7:42 PM   Specimen: Sputum  Result Value Ref Range Status   Specimen Description SPUTUM  Final   Special Requests NONE  Final   Sputum evaluation   Final    THIS SPECIMEN IS ACCEPTABLE FOR SPUTUM CULTURE Performed at [facility], [address] [place], [place] [address]    Report Status 07/13/2023 FINAL  Final  Culture, Respiratory w Gram Stain     Status: None   Collection Time: 07/13/23  7:42 PM   Specimen: SPU  Result Value Ref Range Status   Specimen Description   Final    SPUTUM Performed at [facility], [address], [place], [place] [address]    Special Requests   Final    NONE Reflexed from [id number] Performed at [facility], [address], [place], [place] [address]    Gram Stain   Final    RARE WBC PRESENT, PREDOMINANTLY PMN RARE GRAM POSITIVE COCCI RARE GRAM NEGATIVE RODS    Culture   Final    MODERATE Normal respiratory flora-no Staph aureus or Pseudomonas seen Performed at [facility], [address], [place], [place] [address]    Report Status 07/16/2023 FINAL  Final  Gastrointestinal Panel by PCR , Stool     Status:  None   Collection Time: 07/14/23  9:59 AM   Specimen: Stool  Result Value Ref Range Status   Campylobacter species NOT DETECTED NOT DETECTED Final   Plesimonas shigelloides NOT DETECTED NOT DETECTED Final   Salmonella species NOT  DETECTED NOT DETECTED Final   Yersinia enterocolitica NOT DETECTED NOT DETECTED Final   Vibrio species NOT DETECTED NOT DETECTED Final   Vibrio cholerae NOT DETECTED NOT DETECTED Final   Enteroaggregative E coli (EAEC) NOT DETECTED NOT DETECTED Final   Enteropathogenic E coli (EPEC) NOT DETECTED NOT DETECTED Final   Enterotoxigenic E coli (ETEC) NOT DETECTED NOT DETECTED Final   Shiga like toxin producing E coli (STEC) NOT DETECTED NOT DETECTED Final   Shigella/Enteroinvasive E coli (EIEC) NOT DETECTED NOT DETECTED Final   Cryptosporidium NOT DETECTED NOT DETECTED Final   Cyclospora cayetanensis NOT DETECTED NOT DETECTED Final   Entamoeba histolytica NOT DETECTED NOT DETECTED Final   Giardia lamblia NOT DETECTED NOT DETECTED Final   Adenovirus F40/41 NOT DETECTED NOT DETECTED Final   Astrovirus NOT DETECTED NOT DETECTED Final   Norovirus GI/GII NOT DETECTED NOT DETECTED Final   Rotavirus Daton Szilagyi NOT DETECTED NOT DETECTED Final   Sapovirus (I, II, IV, and V) NOT DETECTED NOT DETECTED Final    Comment: Performed at [facility], [address] [place], [place] [address]     Labs: Basic Metabolic Panel: Recent Labs  Lab 07/13/23 1023 07/14/23 0540 07/15/23 0535 07/16/23 0559  NA 140 136 141 138  K 3.5 4.1 3.9 3.8  CL 105 106 108 107  CO2 30 25 25 25   GLUCOSE 108* 153* 120* 123*  BUN 18 19 21 19   CREATININE 0.84 0.81 0.77 0.74  CALCIUM 9.5 8.8* 9.2 9.1  MG  --   --  2.1 2.0  PHOS  --   --  2.9 3.2   Liver Function Tests: Recent Labs  Lab 07/13/23 1023 07/14/23 0540 07/15/23 0535 07/16/23 0559  AST 26 36 24 22  ALT 23 43 35 37  ALKPHOS 50 44 45 47  BILITOT 0.8 0.8 0.3 0.3  PROT 6.0* 5.4* 5.2* 5.3*  ALBUMIN 3.8 2.8* 2.6* 2.6*    Recent Labs  Lab 07/13/23 1023  LIPASE 14   No results for input(s): "AMMONIA" in the last 168 hours. CBC: Recent Labs  Lab 07/13/23 1023 07/15/23 0535 07/16/23 0559  WBC 12.9* 9.8 7.1  NEUTROABS  --  7.2 4.9  HGB 11.4* 10.2* 10.6*  HCT 35.6* 32.8* 33.6*  MCV 91.5 95.3 93.6  PLT 316 287 305   Cardiac Enzymes: No results for input(s): "CKTOTAL", "CKMB", "CKMBINDEX", "TROPONINI" in the last 168 hours. BNP: BNP (last 3 results) No results for input(s): "BNP" in the last 8760 hours.  ProBNP (last 3 results) No results for input(s): "PROBNP" in the last 8760 hours.  CBG: No results for input(s): "GLUCAP" in the last 168 hours.     Signed:  Lacretia Nicks MD.  [facility] 07/16/2023, 3:49 PM

## 2023-07-17 ENCOUNTER — Other Ambulatory Visit (HOSPITAL_COMMUNITY): Payer: Self-pay

## 2023-07-17 NOTE — Telephone Encounter (Signed)
 Called and spoke with patient's wife. She wanted to inform us that she called [facility] and patient has an appt with Dr. Delorise Royals (medical oncologist) on 07/26/23 at 8 am. Patient's wife wanted to know if Dr. Barron Alvine needed to reach out to Dr. Raynald Kemp, I informed her that [facility] has access to all of our records and can review. They can reach out if they have any questions. Patient's wife verbalized understanding and had no concerns at the end of the call.

## 2023-07-17 NOTE — Telephone Encounter (Signed)
 Inbound call from patient's wife, wishing to speak to [place] in regards to message below. Would like for you to call home phone.

## 2023-07-17 NOTE — Telephone Encounter (Signed)
 Called and spoke with patient's wife, she will contact [facility] to schedule follow up for patient. Patient was discharged yesterday. Pt's wife had no concerns at the end of the call.

## 2023-07-18 DIAGNOSIS — R21 Rash and other nonspecific skin eruption: Secondary | ICD-10-CM | POA: Diagnosis not present

## 2023-07-18 DIAGNOSIS — Z6824 Body mass index (BMI) 24.0-24.9, adult: Secondary | ICD-10-CM | POA: Diagnosis not present

## 2023-07-19 DIAGNOSIS — A498 Other bacterial infections of unspecified site: Secondary | ICD-10-CM | POA: Diagnosis not present

## 2023-07-19 DIAGNOSIS — Z9889 Other specified postprocedural states: Secondary | ICD-10-CM | POA: Diagnosis not present

## 2023-07-19 DIAGNOSIS — Z9049 Acquired absence of other specified parts of digestive tract: Secondary | ICD-10-CM | POA: Diagnosis not present

## 2023-07-19 DIAGNOSIS — Z87898 Personal history of other specified conditions: Secondary | ICD-10-CM | POA: Diagnosis not present

## 2023-07-25 DIAGNOSIS — C189 Malignant neoplasm of colon, unspecified: Secondary | ICD-10-CM | POA: Diagnosis not present

## 2023-07-25 DIAGNOSIS — R918 Other nonspecific abnormal finding of lung field: Secondary | ICD-10-CM | POA: Diagnosis not present

## 2023-07-25 DIAGNOSIS — C7A8 Other malignant neuroendocrine tumors: Secondary | ICD-10-CM | POA: Diagnosis not present

## 2023-07-26 DIAGNOSIS — C189 Malignant neoplasm of colon, unspecified: Secondary | ICD-10-CM | POA: Diagnosis not present

## 2023-07-26 DIAGNOSIS — C7A8 Other malignant neuroendocrine tumors: Secondary | ICD-10-CM | POA: Diagnosis not present

## 2023-07-26 DIAGNOSIS — D137 Benign neoplasm of endocrine pancreas: Secondary | ICD-10-CM | POA: Diagnosis not present

## 2023-07-26 DIAGNOSIS — M0600 Rheumatoid arthritis without rheumatoid factor, unspecified site: Secondary | ICD-10-CM | POA: Diagnosis not present

## 2023-07-31 ENCOUNTER — Encounter: Payer: Self-pay | Admitting: Internal Medicine

## 2023-07-31 ENCOUNTER — Ambulatory Visit (INDEPENDENT_AMBULATORY_CARE_PROVIDER_SITE_OTHER): Admitting: Internal Medicine

## 2023-07-31 ENCOUNTER — Other Ambulatory Visit: Payer: Self-pay

## 2023-07-31 VITALS — BP 130/71 | HR 71 | Temp 97.5°F | Ht 73.0 in | Wt 178.0 lb

## 2023-07-31 DIAGNOSIS — R7881 Bacteremia: Secondary | ICD-10-CM | POA: Diagnosis not present

## 2023-07-31 DIAGNOSIS — I2699 Other pulmonary embolism without acute cor pulmonale: Secondary | ICD-10-CM

## 2023-07-31 DIAGNOSIS — B9620 Unspecified Escherichia coli [E. coli] as the cause of diseases classified elsewhere: Secondary | ICD-10-CM | POA: Diagnosis not present

## 2023-07-31 DIAGNOSIS — M069 Rheumatoid arthritis, unspecified: Secondary | ICD-10-CM

## 2023-07-31 NOTE — Progress Notes (Signed)
 Patient Active Problem List   Diagnosis Date Noted   E coli bacteremia 07/14/2023   Sepsis (HCC) 07/13/2023   Fever 07/13/2023   Cough 07/13/2023   Systemic inflammatory response syndrome (HCC) 07/13/2023   Generalized abdominal pain 12/09/2022   Partial small bowel obstruction (HCC) 12/09/2022   Long term (current) use of anticoagulants 12/09/2022   Recurrent pulmonary embolism (HCC) 12/09/2022   Benign prostatic hyperplasia with lower urinary tract symptoms 12/09/2022   Chronic kidney disease 12/09/2022   Seronegative rheumatoid arthritis (HCC) 12/09/2022   SBO (small bowel obstruction) (HCC) 12/09/2022   Essential (primary) hypertension 08/16/2022   History of Whipple procedure 08/16/2022   Insulinoma 11/23/2019   Nephrotic syndrome with membranous glomerulonephritis 12/12/2017   Pure hypercholesterolemia 12/12/2017    Patient's Medications  New Prescriptions   No medications on file  Previous Medications   AMLODIPINE  (NORVASC ) 5 MG TABLET    Take 5 mg by mouth at bedtime.   ASCORBIC ACID  (VITAMIN C) 500 MG TABLET    Take 500 mg by mouth in the morning.   ATORVASTATIN  (LIPITOR) 40 MG TABLET    Take 40 mg by mouth at bedtime.    CALCIUM  CARB-CHOLECALCIFEROL  (CALCIUM  600 + D PO)    Take 1 capsule by mouth in the morning.   CHOLECALCIFEROL  (VITAMIN D3) 25 MCG (1000 UNIT) TABLET    Take 1,000 Units by mouth in the morning.   CYANOCOBALAMIN  (B-12) 1000 MCG TABS    Take 1 tablet by mouth in the morning.   FERROUS SULFATE  325 (65 FE) MG TABLET    Take 325 mg by mouth every Monday, Wednesday, and Friday.   FINASTERIDE  (PROSCAR ) 5 MG TABLET    Take 5 mg by mouth at bedtime.   FUROSEMIDE  (LASIX ) 20 MG TABLET    Take 20 mg by mouth as needed for fluid.   HYDROXYCHLOROQUINE  (PLAQUENIL ) 200 MG TABLET    Take 400 mg by mouth at bedtime.   LOSARTAN  (COZAAR ) 100 MG TABLET    Take 100 mg by mouth at bedtime.   PANTOPRAZOLE  (PROTONIX ) 40 MG TABLET    TAKE 1 TABLET(40 MG) BY  MOUTH TWICE DAILY   PREDNISONE  (DELTASONE ) 5 MG TABLET    Take 5 mg by mouth daily with breakfast.   SENNA (SENOKOT) 8.6 MG TABLET    Take 1 tablet by mouth at bedtime.   XARELTO  20 MG TABS TABLET    Take 20 mg by mouth in the morning.   ZINC  GLUCONATE 50 MG TABLET    Take 50 mg by mouth in the morning.  Modified Medications   No medications on file  Discontinued Medications   ACETAMINOPHEN  (TYLENOL ) 650 MG CR TABLET    Take 1,300 mg by mouth in the morning and at bedtime.   MELATONIN 5 MG CAPS    Take 1 capsule by mouth daily.    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 coli bacteremia.  Blood cultures on 4/5 grew 2/2 E. coli, treated with ceftriaxone /5-4/8 then transition to cefadroxil  1 g twice daily for additional 10 days to complete 2 weeks of total antibiotics.  Etiology of bacteremia seems unclear sputum cultures grew normal respiratory flora.  UA  negative nitrites negative leukocytes.  CT showed indeterminant circumferential thickening of the rectum might be artifact.  Chronic postop changes in abdomen with severe diverticulosis and inflammation.  CT angio PE did not show PE.  Ecioli bactermia: 3 peosdpoes in the past ->last episde in May 2024. Episode prior to that was 10 or more years ago.  Pt has seen GI Dr. Laveda Ports for colonoscopy.  Today 4/223/25: Discussed the use of AI scribe software for clinical note transcription with the patient, who gave verbal consent to proceed. Stephen Gross "[place]" is a 78 year old male with recurrent E. coli bacteremia who presents for follow-up after recent hospitalization for the same condition.  He has a history of  recurrent E. coli bacteremia, with the first documented episode occurring over ten years ago in [place] . The most recent episode was in May of last year, with positive cultures obtained at [facility]. He was hospitalized at [facility] following this episode.  He underwent a Whipple procedure in 2002, after which he began experiencing recurrent E. coli bacteremia. It is suspected that the bacteremia may be related to changes in the biliary system post-surgery.   He has a history of colon cancer, diagnosed following a colonoscopy in August, which led to a colectomy. He has not undergone chemotherapy.  He is currently on prednisone  5 mg for rheumatoid arthritis and takes hydroxychloroquine  and Xarelto  for pulmonary embolisms.   During the review of symptoms, he reported a history of fever that escalated quickly, reaching 102F, which prompted a visit to the emergency department. No current fever reported. Review of Systems: Review of Systems  All other systems reviewed and are negative.   Past Medical History:  Diagnosis Date   ANA positive    Arthritis    Blood clot in vein    in lung; unprevoked pulmonary embolisms in  2018 and 04/07/2018 managed on xarelto     Cancer (HCC)    insulinoma   Colon cancer (HCC) 01/21/2023   Diverticulosis    DVT (deep venous thrombosis) (HCC)    E coli bacteremia    Hypertension    Insulinoma    Membranous glomerulonephritis    Pancreatitis    Pulmonary embolism (HCC)    Pure hypercholesterolemia    Thyroid  nodule     Social History   Tobacco Use   Smoking status: Former    Current packs/day: 0.00    Types: Cigarettes    Quit date: 1972    Years since quitting: 53.3   Smokeless tobacco: Never  Vaping Use   Vaping status: Never Used  Substance Use Topics   Alcohol use: Yes    Comment: Occ   Drug use: Never    Family History  Problem Relation Age of Onset   Heart disease Mother    Heart disease Father    Lung cancer Paternal  Uncle    Colon cancer Neg Hx    Esophageal cancer Neg Hx     Allergies  Allergen Reactions   Cefadroxil  Dermatitis   Lisinopril Cough   Nsaids     Bleeding internally    Health Maintenance  Topic Date Due   Hepatitis C Screening  Never done   DTaP/Tdap/Td (1 - Tdap) Never done   Pneumonia Vaccine 16+ Years old (1 of 2 - PCV) Never done   Zoster Vaccines- Shingrix (1 of 2) Never done   COVID-19 Vaccine (5 - 2024-25 season) 12/09/2022   Medicare Annual Wellness (AWV)  08/23/2023   INFLUENZA VACCINE  11/08/2023   Colonoscopy  12/31/2023   HPV VACCINES  Aged Out   Meningococcal B Vaccine  Aged Out    Objective:  Vitals:   07/31/23 1258  Weight: 178 lb (80.7 kg)  Height: 6\' 1"  (1.854 m)   Body mass index is 23.48 kg/m.  Physical Exam Constitutional:      General: He is not in acute distress.    Appearance: He is normal weight. He is not toxic-appearing.  HENT:     Head: Normocephalic and atraumatic.     Right Ear: External ear normal.     Left Ear: External ear normal.     Nose: No congestion or rhinorrhea.     Mouth/Throat:     Mouth: Mucous membranes are moist.     Pharynx: Oropharynx is clear.  Eyes:     Extraocular Movements: Extraocular movements intact.     Conjunctiva/sclera: Conjunctivae normal.     Pupils: Pupils are equal, round, and reactive to light.  Cardiovascular:     Rate and Rhythm: Normal rate and regular rhythm.     Heart sounds: No murmur heard.    No friction rub. No gallop.  Pulmonary:     Effort: Pulmonary effort is normal.     Breath sounds: Normal breath sounds.  Abdominal:     General: Abdomen is flat. Bowel sounds are normal.     Palpations: Abdomen is soft.  Musculoskeletal:        General: No swelling. Normal range of motion.     Cervical back: Normal range of motion and neck supple.  Skin:    General: Skin is warm and dry.  Neurological:     General: No focal deficit present.     Mental Status: He is oriented to person,  place, and time.  Psychiatric:        Mood and Affect: Mood normal.    Physical Exam   Lab Results Lab Results  Component Value Date   WBC 7.1 07/16/2023   HGB 10.6 (L) 07/16/2023   HCT 33.6 (L) 07/16/2023   MCV 93.6 07/16/2023   PLT 305 07/16/2023    Lab Results  Component Value Date   CREATININE 0.74 07/16/2023   BUN 19 07/16/2023   NA 138 07/16/2023   K 3.8 07/16/2023   CL 107 07/16/2023   CO2 25 07/16/2023    Lab Results  Component Value Date   ALT 37 07/16/2023   AST 22 07/16/2023   ALKPHOS 47 07/16/2023   BILITOT 0.3 07/16/2023    No results found for: "CHOL", "HDL", "LDLCALC", "LDLDIRECT", "TRIG", "CHOLHDL" No results found for: "LABRPR", "RPRTITER" No results found for: "HIV1RNAQUANT", "HIV1RNAVL", "CD4TABS"   Problem List Items Addressed This Visit   None  Results   Assessment/Plan Hx of recurrent E. coli bacteremia 2/2 possible GI source  Recurrent E. coli bacteremia, likely GI source. - Patient was hospitalized in the beginning of April for E. coli bacteremia.  CT abdomen pelvis showed indeterminant circumferential thickening of the rectum, extensive chronic postop changes in the abdomen, chronic severe diverticulosis.  Repeat CT in 4/17 showed no acute abnormalities, GI tract was notable for sigmoid diverticulosis.  Mild intrahepatic biliary ductal dilatation. - Given rectal thickening and external rectal reticulosis I suspect possible colonic source for bacteremia.  Of note patient has had 3 prior episodes of E. coli bacteremia.  He states that last one was in May 2024.  Around August he had a colonoscopy which showed colon cancer for which he  had a colectomy.  He is followed by Dr. Manus Sellers at [facility]. - Order blood cultures as patient completed antibiotics on 4/15 for bacteremia. - Advise follow-up with GI oncologist, for colonoscopy scheduling. - Educated on fever guidelines: seek emergency care if fever >100.75F, especially if persistent despite  antipyretics. -F/U with ID PRN  #Possible reaction cefadroxil  - Wife notes that patient completed cefadroxil  although had rash with the drug.  She is concerned about possible sulfa allergy as he has some tolerance issues with cefadroxil . - Has no prior sulfa allergy noted. Discussed cefadroxil  is a cephlasporin. If devlops allergic reaciton to bacactrim(if ever Rx) then contact medical professional and hold the drug.   Colon cancer Colon cancer treated with colectomy, no chemotherapy. Recent CT showed no immediate recurrence. Follow-up with GI oncologist planned. - Ensure follow-up with GI oncologist, Dr.Hsu, in October 2025 for cancer monitoring and colonoscopy scheduling.  Pulmonary embolism Pulmonary embolism managed with anticoagulation therapy.  Rheumatoid arthritis Rheumatoid arthritis managed with prednisone  and hydroxychloroquine       Orlie Bjornstad, MD [facility] 07/31/2023, 1:03 PM I have personally spent 107 minutes involved in face-to-face and non-face-to-face activities for this patient on the day of the visit. Professional time spent includes the following activities: Preparing to see the patient (review of tests), Obtaining and/or reviewing separately obtained history (admission/discharge record), Performing a medically appropriate examination and/or evaluation , Ordering medications/tests/procedures, referring and communicating with other health care professionals, Documenting clinical information in the EMR, Independently interpreting results (not separately reported), Communicating results to the patient/family/caregiver, Counseling and educating the patient/family/caregiver and Care coordination (not separately reported).

## 2023-08-06 LAB — CULTURE, BLOOD (SINGLE)
MICRO NUMBER:: 16368022
MICRO NUMBER:: 16368024

## 2023-09-04 DIAGNOSIS — D3A8 Other benign neuroendocrine tumors: Secondary | ICD-10-CM | POA: Diagnosis not present

## 2023-09-04 DIAGNOSIS — C189 Malignant neoplasm of colon, unspecified: Secondary | ICD-10-CM | POA: Diagnosis not present

## 2023-09-17 DIAGNOSIS — G720 Drug-induced myopathy: Secondary | ICD-10-CM | POA: Diagnosis not present

## 2023-09-17 DIAGNOSIS — T466X5A Adverse effect of antihyperlipidemic and antiarteriosclerotic drugs, initial encounter: Secondary | ICD-10-CM | POA: Diagnosis not present

## 2023-09-17 DIAGNOSIS — E782 Mixed hyperlipidemia: Secondary | ICD-10-CM | POA: Diagnosis not present

## 2023-09-17 DIAGNOSIS — N049 Nephrotic syndrome with unspecified morphologic changes: Secondary | ICD-10-CM | POA: Diagnosis not present

## 2023-09-17 DIAGNOSIS — J438 Other emphysema: Secondary | ICD-10-CM | POA: Diagnosis not present

## 2023-09-17 DIAGNOSIS — Z0000 Encounter for general adult medical examination without abnormal findings: Secondary | ICD-10-CM | POA: Diagnosis not present

## 2023-10-02 DIAGNOSIS — N182 Chronic kidney disease, stage 2 (mild): Secondary | ICD-10-CM | POA: Diagnosis not present

## 2023-10-02 DIAGNOSIS — N052 Unspecified nephritic syndrome with diffuse membranous glomerulonephritis: Secondary | ICD-10-CM | POA: Diagnosis not present

## 2023-10-02 DIAGNOSIS — Z1331 Encounter for screening for depression: Secondary | ICD-10-CM | POA: Diagnosis not present

## 2023-10-10 ENCOUNTER — Ambulatory Visit (INDEPENDENT_AMBULATORY_CARE_PROVIDER_SITE_OTHER): Admitting: Gastroenterology

## 2023-10-10 ENCOUNTER — Encounter: Payer: Self-pay | Admitting: Gastroenterology

## 2023-10-10 VITALS — BP 100/64 | HR 72 | Ht 73.0 in | Wt 176.4 lb

## 2023-10-10 DIAGNOSIS — Z860100 Personal history of colon polyps, unspecified: Secondary | ICD-10-CM | POA: Diagnosis not present

## 2023-10-10 DIAGNOSIS — C189 Malignant neoplasm of colon, unspecified: Secondary | ICD-10-CM | POA: Diagnosis not present

## 2023-10-10 DIAGNOSIS — D137 Benign neoplasm of endocrine pancreas: Secondary | ICD-10-CM

## 2023-10-10 DIAGNOSIS — Z9049 Acquired absence of other specified parts of digestive tract: Secondary | ICD-10-CM | POA: Diagnosis not present

## 2023-10-10 DIAGNOSIS — Z90410 Acquired total absence of pancreas: Secondary | ICD-10-CM

## 2023-10-10 NOTE — Patient Instructions (Signed)
 _______________________________________________________  If your blood pressure at your visit was 140/90 or greater, please contact your primary care physician to follow up on this.  _______________________________________________________  If you are age 77 or older, your body mass index should be between 23-30. Your Body mass index is 23.27 kg/m. If this is out of the aforementioned range listed, please consider follow up with your Primary Care Provider.  If you are age 84 or younger, your body mass index should be between 19-25. Your Body mass index is 23.27 kg/m. If this is out of the aformentioned range listed, please consider follow up with your Primary Care Provider.   ________________________________________________________  The [facility] providers would like to encourage you to use MYCHART to communicate with providers for non-urgent requests or questions.  Due to long hold times on the telephone, sending your provider a message by [facility] may be a faster and more efficient way to get a response.  Please allow 48 business hours for a response.  Please remember that this is for non-urgent requests.  _______________________________________________________  It was a pleasure to see you today!  Vito Cirigliano, D.O.

## 2023-10-10 NOTE — Progress Notes (Signed)
 Chief Complaint:    History of colon cancer  GI History: 78 y.o. 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     - 01/07/2018: Colonoscopy: 2 subcentimeter adenomas, diverticulosis, internal hemorrhoids - 11/28/2022: Colonoscopy: 3 mm cecal adenoma, 8 mm cecal SSP, 20 mm polyp in the descending colon which was endoscopically unresectable and tattoo placed (biopsied with path showing at least intramucosal adenocarcinoma with areas suspicious for submucosal invasion with background fragments of tubular adenoma and focal HGD).  Diverticulosis in the sigmoid and transverse colon, small internal hemorrhoids - 12/31/2022 Colonoscopy [facility]): 25 mm polyp in the proximal descending colon which was not felt to be endoscopically resectable and tattoo placed and referred for surgical resection - 01/21/2023: Open surgical resection with right hemicolectomy at [facility] (path:invasive moderately differentiated adenocarcinoma of the transverse colon arising in tubular adenoma with high-grade dysplasia. Incidental well-differentiated neuroendocrine tumor at the appendiceal tip. 0/16 lymph nodes involved.)     -11/28/2022: EGD: Normal esophagus, evidence of prior Whipple with healthy anastomosis.  Moderate non-H. pylori gastritis, normal small bowel.  Treated with high-dose Protonix  x 4 weeks  HPI:     Patient is a 78 y.o. male presenting to the Gastroenterology Clinic for follow-up.  Last seen by me in the GI clinic on 03/14/2023.  Today, he states he feels well and without any active issues or concerns.  Tentative plan for repeat colonoscopy in 01/2024 for  short interval cancer surveillance with Dr. Alvan at [facility]. Has CT C/A/P scheduled for 11/01/2023 at [facility] with f/u the following day with Dr. Michiel at [facility].   Was seen by his Oncologist (Dr. Gretta) at [facility] on 09/04/2023.  Was seen in follow-up by [facility] (Dr. Michiel) on 07/26/2023.  Reviewed labs from 08/27/2023.  Underwent CT abdomen/pelvis on 07/13/2023 for abdominal pain: Indeterminate circumferential thickening of the rectum, extensive chronic postoperative changes in the abdomen without bowel obstruction, chronic severe diverticulosis without diverticulitis.  Review of systems:     No chest pain, no SOB, no fevers, no urinary sx   Past Medical History:  Diagnosis Date   ANA positive    Arthritis    Blood clot in vein    in lung; unprevoked pulmonary embolisms in  2018 and 04/07/2018 managed on xarelto     Cancer (HCC)    insulinoma   Colon cancer (HCC) 01/21/2023   Diverticulosis    DVT (deep venous thrombosis) (HCC)    E coli bacteremia    Hypertension    Insulinoma    Membranous glomerulonephritis    Pancreatitis    Pulmonary embolism (HCC)    Pure hypercholesterolemia    Thyroid  nodule     Patient's surgical history, family medical history, social history, medications and allergies were all reviewed in Epic    Current Outpatient Medications  Medication Sig Dispense Refill   acetaminophen  (TYLENOL ) 650 MG CR tablet Take 650 mg by mouth 2 (two) times daily.     amLODipine  (NORVASC ) 5 MG tablet Take 5 mg by mouth at bedtime.     ascorbic acid  (VITAMIN C) 500 MG tablet Take 500 mg by mouth in the morning.     atorvastatin  (LIPITOR) 40 MG tablet Take 40  mg by mouth at bedtime.   11   Calcium  Carb-Cholecalciferol  (CALCIUM  600 + D PO) Take 1 capsule by mouth in the morning.     cholecalciferol  (VITAMIN D3) 25 MCG (1000 UNIT) tablet Take 1,000 Units by mouth in the morning.     Cyanocobalamin  (B-12) 1000 MCG TABS Take 1 tablet by mouth in the morning.     ferrous sulfate   325 (65 FE) MG tablet Take 325 mg by mouth every Monday, Wednesday, and Friday.     finasteride  (PROSCAR ) 5 MG tablet Take 5 mg by mouth at bedtime.     furosemide  (LASIX ) 20 MG tablet Take 20 mg by mouth as needed for fluid.     hydroxychloroquine  (PLAQUENIL ) 200 MG tablet Take 400 mg by mouth at bedtime.     losartan  (COZAAR ) 100 MG tablet Take 100 mg by mouth at bedtime.     pantoprazole  (PROTONIX ) 40 MG tablet TAKE 1 TABLET(40 MG) BY MOUTH TWICE DAILY 180 tablet 2   predniSONE  (DELTASONE ) 5 MG tablet Take 5 mg by mouth daily with breakfast.     senna (SENOKOT) 8.6 MG tablet Take 1 tablet by mouth at bedtime.     XARELTO  20 MG TABS tablet Take 20 mg by mouth in the morning.     zinc  gluconate 50 MG tablet Take 50 mg by mouth in the morning.     No current facility-administered medications for this visit.    Physical Exam:     BP 100/64   Pulse 72   Ht 6' 1 (1.854 m)   Wt 176 lb 6 oz (80 kg)   BMI 23.27 kg/m   GENERAL:  Pleasant male in NAD PSYCH: : Cooperative, normal affect NEURO: Alert and oriented x 3, no focal neurologic deficits   IMPRESSION and PLAN:    1) Colon cancer 2) History of colon polyps Diagnosed with stage I moderately differentiated adenocarcinoma of the transverse colon by colonoscopy in 11/2021, subsequently underwent right hemicolectomy in 01/2023 at [facility].  0/16 lymph nodes involved and no need for chemotherapy or radiation.  Doing well recovering postoperatively. - Continue follow-up in the [facility] - Plan for repeat colonoscopy in 01/2024 for short interval surveillance.  That can be done either with Dr. Alvan at [facility] or here per discussion between patient and his Oncologist   3) Appendiceal neuroendocrine tumor Incidentally noted small NET of the appendiceal tip at time of right hemicolectomy as above. - Continue follow-up with [facility]   4) History of insulinoma s/p Whipple 2002 -Continue follow-up with [facility] and [facility]     I spent 35 minutes of time, including in depth chart review, independent review of results as outlined above, communicating results with the patient directly, face-to-face time with the patient, coordinating care, and ordering studies and medications as appropriate, and documentation.         Stephen Gross ,DO, FACG 10/10/2023, 9:57 AM

## 2023-10-14 DIAGNOSIS — N049 Nephrotic syndrome with unspecified morphologic changes: Secondary | ICD-10-CM | POA: Diagnosis not present

## 2023-10-14 DIAGNOSIS — N401 Enlarged prostate with lower urinary tract symptoms: Secondary | ICD-10-CM | POA: Diagnosis not present

## 2023-10-14 DIAGNOSIS — C189 Malignant neoplasm of colon, unspecified: Secondary | ICD-10-CM | POA: Diagnosis not present

## 2023-10-14 DIAGNOSIS — G4700 Insomnia, unspecified: Secondary | ICD-10-CM | POA: Diagnosis not present

## 2023-10-14 DIAGNOSIS — R351 Nocturia: Secondary | ICD-10-CM | POA: Diagnosis not present

## 2023-10-24 ENCOUNTER — Inpatient Hospital Stay (HOSPITAL_BASED_OUTPATIENT_CLINIC_OR_DEPARTMENT_OTHER)
Admission: EM | Admit: 2023-10-24 | Discharge: 2023-10-27 | DRG: 864 | Disposition: A | Discharge status: Home / Self Care | Attending: Internal Medicine | Admitting: Internal Medicine

## 2023-10-24 ENCOUNTER — Emergency Department (HOSPITAL_BASED_OUTPATIENT_CLINIC_OR_DEPARTMENT_OTHER)

## 2023-10-24 ENCOUNTER — Other Ambulatory Visit: Payer: Self-pay

## 2023-10-24 DIAGNOSIS — K4090 Unilateral inguinal hernia, without obstruction or gangrene, not specified as recurrent: Secondary | ICD-10-CM | POA: Diagnosis not present

## 2023-10-24 DIAGNOSIS — Z79899 Other long term (current) drug therapy: Secondary | ICD-10-CM

## 2023-10-24 DIAGNOSIS — K219 Gastro-esophageal reflux disease without esophagitis: Secondary | ICD-10-CM | POA: Diagnosis present

## 2023-10-24 DIAGNOSIS — N042 Nephrotic syndrome with diffuse membranous glomerulonephritis, unspecified: Secondary | ICD-10-CM | POA: Diagnosis present

## 2023-10-24 DIAGNOSIS — M0600 Rheumatoid arthritis without rheumatoid factor, unspecified site: Secondary | ICD-10-CM | POA: Diagnosis present

## 2023-10-24 DIAGNOSIS — Z85038 Personal history of other malignant neoplasm of large intestine: Secondary | ICD-10-CM

## 2023-10-24 DIAGNOSIS — R112 Nausea with vomiting, unspecified: Secondary | ICD-10-CM | POA: Diagnosis present

## 2023-10-24 DIAGNOSIS — Z8249 Family history of ischemic heart disease and other diseases of the circulatory system: Secondary | ICD-10-CM

## 2023-10-24 DIAGNOSIS — Z9049 Acquired absence of other specified parts of digestive tract: Secondary | ICD-10-CM

## 2023-10-24 DIAGNOSIS — N401 Enlarged prostate with lower urinary tract symptoms: Secondary | ICD-10-CM | POA: Diagnosis present

## 2023-10-24 DIAGNOSIS — R509 Fever, unspecified: Secondary | ICD-10-CM | POA: Diagnosis not present

## 2023-10-24 DIAGNOSIS — I2699 Other pulmonary embolism without acute cor pulmonale: Secondary | ICD-10-CM | POA: Diagnosis present

## 2023-10-24 DIAGNOSIS — I959 Hypotension, unspecified: Secondary | ICD-10-CM | POA: Diagnosis not present

## 2023-10-24 DIAGNOSIS — R109 Unspecified abdominal pain: Secondary | ICD-10-CM | POA: Diagnosis not present

## 2023-10-24 DIAGNOSIS — E7800 Pure hypercholesterolemia, unspecified: Secondary | ICD-10-CM | POA: Diagnosis present

## 2023-10-24 DIAGNOSIS — I10 Essential (primary) hypertension: Secondary | ICD-10-CM | POA: Diagnosis present

## 2023-10-24 DIAGNOSIS — M353 Polymyalgia rheumatica: Secondary | ICD-10-CM | POA: Diagnosis present

## 2023-10-24 DIAGNOSIS — Z7901 Long term (current) use of anticoagulants: Secondary | ICD-10-CM

## 2023-10-24 DIAGNOSIS — N182 Chronic kidney disease, stage 2 (mild): Secondary | ICD-10-CM | POA: Diagnosis present

## 2023-10-24 DIAGNOSIS — Z7952 Long term (current) use of systemic steroids: Secondary | ICD-10-CM

## 2023-10-24 DIAGNOSIS — N400 Enlarged prostate without lower urinary tract symptoms: Secondary | ICD-10-CM | POA: Diagnosis not present

## 2023-10-24 DIAGNOSIS — Z87891 Personal history of nicotine dependence: Secondary | ICD-10-CM

## 2023-10-24 DIAGNOSIS — Z7401 Bed confinement status: Secondary | ICD-10-CM | POA: Diagnosis not present

## 2023-10-24 DIAGNOSIS — E860 Dehydration: Secondary | ICD-10-CM | POA: Diagnosis present

## 2023-10-24 DIAGNOSIS — Z90410 Acquired total absence of pancreas: Secondary | ICD-10-CM

## 2023-10-24 DIAGNOSIS — Z86711 Personal history of pulmonary embolism: Secondary | ICD-10-CM

## 2023-10-24 DIAGNOSIS — Z90411 Acquired partial absence of pancreas: Secondary | ICD-10-CM

## 2023-10-24 DIAGNOSIS — C189 Malignant neoplasm of colon, unspecified: Secondary | ICD-10-CM | POA: Diagnosis not present

## 2023-10-24 DIAGNOSIS — N189 Chronic kidney disease, unspecified: Secondary | ICD-10-CM | POA: Diagnosis present

## 2023-10-24 LAB — URINALYSIS, W/ REFLEX TO CULTURE (INFECTION SUSPECTED)
Bacteria, UA: NONE SEEN
Bilirubin Urine: NEGATIVE
Glucose, UA: NEGATIVE mg/dL
Ketones, ur: 15 mg/dL — AB
Leukocytes,Ua: NEGATIVE
Nitrite: NEGATIVE
Protein, ur: 100 mg/dL — AB
Specific Gravity, Urine: >1.046 — ABNORMAL HIGH (ref 1.005–1.030)
pH: 6 (ref 5.0–8.0)

## 2023-10-24 LAB — COMPREHENSIVE METABOLIC PANEL WITH GFR
ALT: 22 U/L (ref 0–44)
AST: 23 U/L (ref 15–41)
Albumin: 3.9 g/dL (ref 3.5–5.0)
Alkaline Phosphatase: 57 U/L (ref 38–126)
Anion gap: 10 (ref 5–15)
BUN: 24 mg/dL — ABNORMAL HIGH (ref 8–23)
CO2: 27 mmol/L (ref 22–32)
Calcium: 9.8 mg/dL (ref 8.9–10.3)
Chloride: 103 mmol/L (ref 98–111)
Creatinine, Ser: 1.03 mg/dL (ref 0.61–1.24)
GFR, Estimated: >60 mL/min (ref >60)
Glucose, Bld: 104 mg/dL — ABNORMAL HIGH (ref 70–99)
Potassium: 3.7 mmol/L (ref 3.5–5.1)
Sodium: 140 mmol/L (ref 135–145)
Total Bilirubin: 1 mg/dL (ref 0.0–1.2)
Total Protein: 6.1 g/dL — ABNORMAL LOW (ref 6.5–8.1)

## 2023-10-24 LAB — CBC WITH DIFFERENTIAL/PLATELET
Abs Immature Granulocytes: 0.03 K/uL (ref 0.00–0.07)
Basophils Absolute: 0 K/uL (ref 0.0–0.1)
Basophils Relative: 0 %
Eosinophils Absolute: 0 K/uL (ref 0.0–0.5)
Eosinophils Relative: 0 %
HCT: 36.6 % — ABNORMAL LOW (ref 39.0–52.0)
Hemoglobin: 12.2 g/dL — ABNORMAL LOW (ref 13.0–17.0)
Immature Granulocytes: 0 %
Lymphocytes Relative: 2 %
Lymphs Abs: 0.2 K/uL — ABNORMAL LOW (ref 0.7–4.0)
MCH: 31.2 pg (ref 26.0–34.0)
MCHC: 33.3 g/dL (ref 30.0–36.0)
MCV: 93.6 fL (ref 80.0–100.0)
Monocytes Absolute: 0.6 K/uL (ref 0.1–1.0)
Monocytes Relative: 7 %
Neutro Abs: 8.3 K/uL — ABNORMAL HIGH (ref 1.7–7.7)
Neutrophils Relative %: 91 %
Platelets: 241 K/uL (ref 150–400)
RBC: 3.91 MIL/uL — ABNORMAL LOW (ref 4.22–5.81)
RDW: 13.3 % (ref 11.5–15.5)
WBC: 9.2 K/uL (ref 4.0–10.5)
nRBC: 0 % (ref 0.0–0.2)

## 2023-10-24 LAB — PROTIME-INR
INR: 1.1 (ref 0.8–1.2)
Prothrombin Time: 14.6 s (ref 11.4–15.2)

## 2023-10-24 LAB — LIPASE, BLOOD: Lipase: 21 U/L (ref 11–51)

## 2023-10-24 LAB — RESP PANEL BY RT-PCR (RSV, FLU A&B, COVID)  RVPGX2
Influenza A by PCR: NEGATIVE
Influenza B by PCR: NEGATIVE
Resp Syncytial Virus by PCR: NEGATIVE
SARS Coronavirus 2 by RT PCR: NEGATIVE

## 2023-10-24 LAB — LACTIC ACID, PLASMA: Lactic Acid, Venous: 1.1 mmol/L (ref 0.5–1.9)

## 2023-10-24 MED ORDER — ONDANSETRON HCL 4 MG/2ML IJ SOLN
4.0000 mg | Freq: Once | INTRAMUSCULAR | Status: AC
  Administered 2023-10-24: 4 mg via INTRAVENOUS
  Filled 2023-10-24: qty 2

## 2023-10-24 MED ORDER — IOHEXOL 300 MG/ML  SOLN
100.0000 mL | Freq: Once | INTRAMUSCULAR | Status: AC | PRN
  Administered 2023-10-24: 100 mL via INTRAVENOUS

## 2023-10-24 MED ORDER — SODIUM CHLORIDE 0.9% FLUSH
3.0000 mL | Freq: Two times a day (BID) | INTRAVENOUS | Status: DC
  Administered 2023-10-24 – 2023-10-27 (×6): 3 mL via INTRAVENOUS

## 2023-10-24 MED ORDER — SORBITOL 70 % SOLN
30.0000 mL | Freq: Every day | Status: DC | PRN
Start: 2023-10-24 — End: 2023-10-27

## 2023-10-24 MED ORDER — SODIUM CHLORIDE 0.9 % IV SOLN
2.0000 g | Freq: Once | INTRAVENOUS | Status: AC
  Administered 2023-10-24: 2 g via INTRAVENOUS
  Filled 2023-10-24: qty 20

## 2023-10-24 MED ORDER — LACTATED RINGERS IV SOLN: INTRAVENOUS | Status: AC

## 2023-10-24 MED ORDER — VITAMIN B-12 1000 MCG PO TABS
1000.0000 ug | ORAL_TABLET | Freq: Every day | ORAL | Status: DC
  Administered 2023-10-25 – 2023-10-27 (×3): 1000 ug via ORAL
  Filled 2023-10-24 (×3): qty 1

## 2023-10-24 MED ORDER — ONDANSETRON HCL 4 MG PO TABS: 4.0000 mg | ORAL_TABLET | Freq: Four times a day (QID) | ORAL | Status: DC | PRN

## 2023-10-24 MED ORDER — FERROUS SULFATE 325 (65 FE) MG PO TABS
325.0000 mg | ORAL_TABLET | ORAL | Status: DC
  Administered 2023-10-25: 325 mg via ORAL
  Filled 2023-10-24: qty 1

## 2023-10-24 MED ORDER — PANTOPRAZOLE SODIUM 40 MG IV SOLR
40.0000 mg | Freq: Two times a day (BID) | INTRAVENOUS | Status: DC
  Administered 2023-10-24 – 2023-10-27 (×6): 40 mg via INTRAVENOUS
  Filled 2023-10-24 (×6): qty 10

## 2023-10-24 MED ORDER — ACETAMINOPHEN 500 MG PO TABS
1000.0000 mg | ORAL_TABLET | Freq: Once | ORAL | Status: AC
  Administered 2023-10-24: 1000 mg via ORAL
  Filled 2023-10-24: qty 2

## 2023-10-24 MED ORDER — SENNA 8.6 MG PO TABS
1.0000 | ORAL_TABLET | Freq: Every day | ORAL | Status: DC
  Administered 2023-10-24: 8.6 mg via ORAL
  Filled 2023-10-24: qty 1

## 2023-10-24 MED ORDER — POLYETHYLENE GLYCOL 3350 17 G PO PACK
17.0000 g | PACK | Freq: Every day | ORAL | Status: DC | PRN
Start: 2023-10-24 — End: 2023-10-27

## 2023-10-24 MED ORDER — VITAMIN D 25 MCG (1000 UNIT) PO TABS
1000.0000 [IU] | ORAL_TABLET | Freq: Every day | ORAL | Status: DC
  Administered 2023-10-25 – 2023-10-27 (×3): 1000 [IU] via ORAL
  Filled 2023-10-24 (×3): qty 1

## 2023-10-24 MED ORDER — ACETAMINOPHEN 325 MG PO TABS
650.0000 mg | ORAL_TABLET | Freq: Four times a day (QID) | ORAL | Status: DC | PRN
  Administered 2023-10-25 – 2023-10-27 (×2): 650 mg via ORAL
  Filled 2023-10-24 (×2): qty 2

## 2023-10-24 MED ORDER — FINASTERIDE 5 MG PO TABS
5.0000 mg | ORAL_TABLET | Freq: Every day | ORAL | Status: DC
  Administered 2023-10-24 – 2023-10-26 (×3): 5 mg via ORAL
  Filled 2023-10-24 (×3): qty 1

## 2023-10-24 MED ORDER — ONDANSETRON HCL 4 MG/2ML IJ SOLN: 4.0000 mg | Freq: Four times a day (QID) | INTRAMUSCULAR | Status: DC | PRN

## 2023-10-24 MED ORDER — PREDNISONE 5 MG PO TABS
10.0000 mg | ORAL_TABLET | Freq: Every day | ORAL | Status: DC
  Administered 2023-10-25 – 2023-10-27 (×3): 10 mg via ORAL
  Filled 2023-10-24 (×3): qty 2

## 2023-10-24 MED ORDER — HYDRALAZINE HCL 20 MG/ML IJ SOLN: 5.0000 mg | Freq: Four times a day (QID) | INTRAMUSCULAR | Status: DC | PRN

## 2023-10-24 MED ORDER — SODIUM CHLORIDE 0.9 % IV SOLN
2.0000 g | Freq: Three times a day (TID) | INTRAVENOUS | Status: DC
  Administered 2023-10-24 – 2023-10-27 (×9): 2 g via INTRAVENOUS
  Filled 2023-10-24 (×9): qty 12.5

## 2023-10-24 MED ORDER — OXYCODONE HCL 5 MG PO TABS: 5.0000 mg | ORAL_TABLET | ORAL | Status: DC | PRN

## 2023-10-24 MED ORDER — RIVAROXABAN 20 MG PO TABS
20.0000 mg | ORAL_TABLET | Freq: Every morning | ORAL | Status: DC
  Administered 2023-10-25 – 2023-10-27 (×3): 20 mg via ORAL
  Filled 2023-10-24 (×3): qty 1

## 2023-10-24 MED ORDER — LACTATED RINGERS IV BOLUS (SEPSIS)
1000.0000 mL | Freq: Once | INTRAVENOUS | Status: AC
  Administered 2023-10-24: 1000 mL via INTRAVENOUS

## 2023-10-24 MED ORDER — ACETAMINOPHEN 650 MG RE SUPP: 650.0000 mg | Freq: Four times a day (QID) | RECTAL | Status: DC | PRN

## 2023-10-24 NOTE — ED Provider Notes (Signed)
 I provided a substantive portion of the care of this patient.  I personally made/approved the management plan for this patient and take responsibility for the patient management.  EKG Interpretation Date/Time:  Thursday October 24 2023 10:19:15 EDT Ventricular Rate:  78 PR Interval:  168 QRS Duration:  102 QT Interval:  361 QTC Calculation: 412 R Axis:   36  Text Interpretation: Sinus rhythm Ventricular premature complex Minimal ST elevation, anterior leads No previous ECGs available Confirmed by Lyndsey Demos [phone]) on 10/24/2023 10:20:13 AM    Patient seen by me along with the physician assistant.  Patient has been having nausea vomiting body aches fever to 103.  Patient had a Whipple procedure many years ago.  This was done at [facility].  Following that he is basically been having trouble with E. coli seeding and perhaps may be cholangitis.  Is followed by [facility] gastroenterology.  This is occurred frequently.  But does not usually get fevers this high.  His Whipple procedure was done in 2002.  Had his gallbladder removed former smoker quit in 1972.  Will discuss with [facility].  Patient's labs here really without source.  But they say he usually sees his blood with E. coli.  Patient being treated with Rocephin .  Respiratory panel negative lactic acid normal lites reassuring not showing any signs of septic parameters.  Complete metabolic panel including LFTs are normal white count is normal today hemoglobin down a little bit at 12.2.  Lipase normal at 21.  CT scan abdomen and pelvis without any acute inflammatory process identified within the abdomen or pelvis no metastatic disease within the abdomen or pelvis.  And as stated in the HPI patient's had his gallbladder removed.  Feel it is best that patient be admitted we will discuss with GI medicine and then get hospitalist to do the admission.  Just out of precaution.     Hadassa Cermak, MD 10/24/23 [phone]

## 2023-10-24 NOTE — ED Provider Notes (Signed)
 [place] EMERGENCY DEPARTMENT AT [facility] Provider Note   CSN: [id number] Arrival date & time: 10/24/23  1004     Patient presents with: Fever   Stephen Gross is a 78 y.o. male.    Fever   78 year old male presents emergency department with complaints of nausea, vomiting, fever.  Patient states that he woke up around 6 AM this morning and began with generalized bodyaches and had elevated temperature.  Wife attempted to give him Tylenol  but he began vomiting not long after that.  Reports 2-3 episodes of vomiting since symptom onset.  Does report history of Whipple's procedure and has had multiple episodes of E. coli bacteremia in the past with most recent admission in April of this year.  States that he was having some cramping abdominal pain that he thought was related to bouts of emesis and is not experiencing much pain now.  Denies any chest pain, shortness of breath, cough, urinary symptoms, change in bowel habits.  Past medical history significant for insulinoma status post Whipple procedure 2003, PE x 2 on Xarelto , colon cancer with colectomy in October 2024, low-grade neuroendocrine tumor, rheumatoid arthritis, membranous glomerulonephritis  Prior to Admission medications   Medication Sig Start Date End Date Taking? Authorizing Provider  acetaminophen  (TYLENOL ) 650 MG CR tablet Take 650 mg by mouth 2 (two) times daily. 10/02/23   [provider]  amLODipine  (NORVASC ) 5 MG tablet Take 5 mg by mouth at bedtime.    [provider]  ascorbic acid  (VITAMIN C) 500 MG tablet Take 500 mg by mouth in the morning.    [provider]  atorvastatin  (LIPITOR) 40 MG tablet Take 40 mg by mouth at bedtime.  12/06/17   [provider]  Calcium  Carb-Cholecalciferol  (CALCIUM  600 + D PO) Take 1 capsule by mouth in the morning.    [provider]  cholecalciferol  (VITAMIN D3) 25 MCG (1000 UNIT) tablet Take 1,000 Units by mouth in the morning.     [provider]  Cyanocobalamin  (B-12) 1000 MCG TABS Take 1 tablet by mouth in the morning.    [provider]  ferrous sulfate  325 (65 FE) MG tablet Take 325 mg by mouth every Monday, Wednesday, and Friday.    [provider]  finasteride  (PROSCAR ) 5 MG tablet Take 5 mg by mouth at bedtime.    [provider]  furosemide  (LASIX ) 20 MG tablet Take 20 mg by mouth as needed for fluid.    [provider]  hydroxychloroquine  (PLAQUENIL ) 200 MG tablet Take 400 mg by mouth at bedtime.    [provider]  losartan  (COZAAR ) 100 MG tablet Take 100 mg by mouth at bedtime.    [provider]  pantoprazole  (PROTONIX ) 40 MG tablet TAKE 1 TABLET(40 MG) BY MOUTH TWICE DAILY 03/25/23   Cirigliano, Vito V, DO  predniSONE  (DELTASONE ) 5 MG tablet Take 5 mg by mouth daily with breakfast.    [provider]  senna (SENOKOT) 8.6 MG tablet Take 1 tablet by mouth at bedtime.    [provider]  XARELTO  20 MG TABS tablet Take 20 mg by mouth in the morning. 02/20/19   [provider]  zinc  gluconate 50 MG tablet Take 50 mg by mouth in the morning.    [provider]    Allergies: Cefadroxil , Lisinopril, and Nsaids    Review of Systems  Constitutional:  Positive for fever.  All other systems reviewed and are negative.   Updated Vital Signs BP  117/63 (BP Location: Left Arm)   Pulse 83   Temp (!) 102.1 F (38.9 C) (Oral)   Resp 14   SpO2 92%   Physical Exam Vitals and nursing note reviewed.  Constitutional:      General: He is not in acute distress.    Appearance: He is well-developed.  HENT:     Head: Normocephalic and atraumatic.  Eyes:     Conjunctiva/sclera: Conjunctivae normal.  Cardiovascular:     Rate and Rhythm: Normal rate and regular rhythm.  Pulmonary:     Effort: Pulmonary effort is normal. No respiratory distress.     Breath sounds: Normal breath sounds. No wheezing, rhonchi or rales.   Abdominal:     Palpations: Abdomen is soft.     Tenderness: There is no abdominal tenderness. There is no guarding.  Musculoskeletal:        General: No swelling.     Cervical back: Neck supple.     Right lower leg: No edema.     Left lower leg: No edema.  Skin:    General: Skin is warm and dry.     Capillary Refill: Capillary refill takes less than 2 seconds.     Comments: Tactilely warm to the touch.  Neurological:     Mental Status: He is alert.  Psychiatric:        Mood and Affect: Mood normal.     (all labs ordered are listed, but only abnormal results are displayed) Labs Reviewed  CBC WITH DIFFERENTIAL/PLATELET - Abnormal; Notable for the following components:      Result Value   RBC 3.91 (*)    Hemoglobin 12.2 (*)    HCT 36.6 (*)    Neutro Abs 8.3 (*)    Lymphs Abs 0.2 (*)    All other components within normal limits  RESP PANEL BY RT-PCR (RSV, FLU A&B, COVID)  RVPGX2  CULTURE, BLOOD (ROUTINE X 2)  CULTURE, BLOOD (ROUTINE X 2)  PROTIME-INR  LACTIC ACID, PLASMA  LACTIC ACID, PLASMA  COMPREHENSIVE METABOLIC PANEL WITH GFR  URINALYSIS, W/ REFLEX TO CULTURE (INFECTION SUSPECTED)  LIPASE, BLOOD    EKG: EKG Interpretation Date/Time:  Thursday October 24 2023 10:19:15 EDT Ventricular Rate:  78 PR Interval:  168 QRS Duration:  102 QT Interval:  361 QTC Calculation: 412 R Axis:   36  Text Interpretation: Sinus rhythm Ventricular premature complex Minimal ST elevation, anterior leads No previous ECGs available Confirmed by Zackowski, Scott [phone]) on 10/24/2023 10:20:13 AM  Radiology: No results found.   Procedures   Medications Ordered in the ED  lactated ringers  infusion ( Intravenous New Bag/Given 10/24/23 1056)  lactated ringers  bolus 1,000 mL (1,000 mLs Intravenous New Bag/Given 10/24/23 1055)  cefTRIAXone  (ROCEPHIN ) 2 g in sodium chloride  0.9 % 100 mL IVPB (2 g Intravenous New Bag/Given 10/24/23 1104)  ondansetron  (ZOFRAN ) injection 4 mg (4 mg Intravenous  Given 10/24/23 1104)  acetaminophen  (TYLENOL ) tablet 1,000 mg (1,000 mg Oral Given 10/24/23 1102)    Clinical Course as of 10/24/23 1449  Thu Oct 24, 2023  1405 Consulted Dr. Jadine of hospitalist who agreed with admission and assume further treatment/care. [CR]    Clinical Course User Index [CR] Silver Wonda LABOR, PA                                 Medical Decision Making Amount and/or Complexity of Data Reviewed Labs: ordered. Radiology: ordered.  Risk  OTC drugs. Prescription drug management. Decision regarding hospitalization.   This patient presents to the ED for concern of fever, chills, nausea, vomiting, this involves an extensive number of treatment options, and is a complaint that carries with it a high risk of complications and morbidity.  The differential diagnosis includes mild URI, pneumonia, intra-abdominal infection, bacteremia, UTI, viral illness, other   Co morbidities that complicate the patient evaluation  See HPI   Additional history obtained:  Additional history obtained from EMR External records from outside source obtained and reviewed including hospital records   Lab Tests:  I Ordered, and personally interpreted labs.  The pertinent results include: No leukocytosis.  Anemia hemoglobin of 12.2.  Platelets within normal range.  No electrolyte abnormalities.  Isolated elevated BUN of 24 mildly.  No transaminitis.  PT/INR within normal limits.  Lactic acid 1.1.  Lipase within normal limits.  UA 15 ketones, no proteins, trace hemoglobin otherwise unremarkable.  Viral testing negative.  Blood cultures pending.   Imaging Studies ordered:  I ordered imaging studies including chest x-ray, CT abdomen pelvi I independently visualized and interpreted imaging which showed  Chest x-ray: No cute cardiopulmonary abnormality CT abdomen pelvis: No acute inflammatory process within the abdomen or pelvis.  No metastatic disease.  Aortic atherosclerosis. I agree  with the radiologist interpretation  Cardiac Monitoring: / EKG:  The patient was maintained on a cardiac monitor.  I personally viewed and interpreted the cardiac monitored which showed an underlying rhythm of: Sinus rhythm.  PVC.  Minimal ST elevation in anterior leads.   Consultations Obtained:  See ED course   Problem List / ED Course / Critical interventions / Medication management  Fever, nausea vomiting I ordered medication including lactated Ringer 's, Tylenol , Zofran , Rocephin    Reevaluation of the patient after these medicines showed that the patient improved I have reviewed the patients home medicines and have made adjustments as needed   Social Determinants of Health:  Former cigarette use.  Denies illicit drug use.   Test / Admission - Considered:  Fever, nausea vomiting Vitals signs significant for initial fever temp of 102.1 with repeat 99.1. Otherwise within normal range and stable throughout visit. Laboratory/imaging studies significant for: See above 78 year old male presents emergency department with complaints of nausea, vomiting, fever.  Patient states that he woke up around 6 AM this morning and began with generalized bodyaches and had elevated temperature.  Wife attempted to give him Tylenol  but he began vomiting not long after that.  Reports 2-3 episodes of vomiting since symptom onset.  Does report history of Whipple's procedure and has had multiple episodes of E. coli bacteremia in the past with most recent admission in April of this year.  States that he was having some cramping abdominal pain that he thought was related to bouts of emesis and is not experiencing much pain now.  Denies any chest pain, shortness of breath, cough, urinary symptoms, change in bowel habits. On exam, no real reproducible abdominal tenderness.  Lungs clear to auscultation bilaterally.  Workup today reassuring.  No leukocytosis or lactic acidosis.  Imaging of patient's chest and  abdomen negative.  No obvious infectious etiology found on labs as imaging in the ED.  Given patient's history of recurrent E. coli bacteremia mostly attributed to prior intra-abdominal procedure with reported similar presentation today compared to other times this has happened in the past, discussion was had with patient regarding admission for observation of which he was agreeable.  Patient began on Rocephin  IV with  blood cultures pending at this time.  Will admit to hospitalist.  Patient stable upon admission.     Final diagnoses:  None    ED Discharge Orders     None          Silver Wonda LABOR, [place] 10/24/23 1451    Geraldene Hamilton, MD 10/25/23 [phone]

## 2023-10-24 NOTE — ED Notes (Signed)
 Called [facility] to transport the patient to [facility] rm# [phone]

## 2023-10-24 NOTE — ED Triage Notes (Signed)
 C/o generalized body aches, n/v starting today. Fever of 103 pta. Recent dx of colon cancer.  No treatment.

## 2023-10-24 NOTE — H&P (Signed)
 History and Physical    Stephen Gross FMW:[id number] DOB: [birth year] DOA: 10/24/2023  PCP: Rusty, Lonni HERO, MD  Patient coming from: Home via [facility]  I have personally briefly reviewed patient's old medical records in [facility]  Chief Complaint: Nausea vomiting fever  HPI: Stephen Gross is a 78 y.o. 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  Per wife patient had 2 episodes of bilious emesis with some associated diffuse bodyaches.  Per wife patient noted to have a fever 103.2 and patient subsequently presented to PCPs office where he was assessed and directed to the ED.  Patient endorses significant chills, lightheadedness, dizziness.  Patient denies any shortness of breath, no chest pain, no abdominal pain, no diarrhea, no constipation, no melena, no hematemesis, no hematochezia.  No recent travel however per wife patient did travel to [place]  in May.  Patient noted to have received 4 mg of Zofran  at PCPs office and currently without any further nausea or vomiting and feeling much better.  ED Course: Patient seen in the ED, initial blood pressure noted to be soft but responding to IV fluids.  Patient noted to have a temperature of 102.1.  Comprehensive metabolic profile with a glucose of 104, BUN of 24, protein of 6.1 otherwise within normal limits.  Lactic acid level noted at 1.1.  CBC with a hemoglobin of 12.2 otherwise within normal limits, ANC of 8.3.  Urinalysis  done nitrite negative, leukocytes negative, specific gravity > 1.046.  Chest x-ray negative for any acute infiltrates.  Blood cultures ordered and pending.  Patient noted to have received a dose of IV Rocephin  in the ED.  Review of Systems: As per HPI otherwise all other systems reviewed and are negative.  Past Medical History:  Diagnosis Date   ANA positive    Arthritis    Blood clot in vein    in lung; unprevoked pulmonary embolisms in  2018 and 04/07/2018 managed on xarelto     Cancer (HCC)    insulinoma   Colon cancer (HCC) 01/21/2023   Diverticulosis    DVT (deep venous thrombosis) (HCC)    E coli bacteremia    Hypertension    Insulinoma    Membranous glomerulonephritis    Pancreatitis    Pulmonary embolism (HCC)    Pure hypercholesterolemia    Thyroid  nodule     Past Surgical History:  Procedure Laterality Date   CARPAL TUNNEL RELEASE Bilateral    CHOLECYSTECTOMY     INGUINAL HERNIA REPAIR Left 03/19/2019   Procedure: OPEN REPAIR OF LEFT INGUINAL HERNIA WITH MESH;  Surgeon: Tanda Locus, MD;  Location: [facility];  Service: General;  Laterality: Left;   PANCREATICODUODENECTOMY  2002   RENAL BIOPSY     TONSILLECTOMY     WHIPPLE PROCEDURE      Social History  reports that he quit smoking about 53 years ago. His smoking use included cigarettes. He has never used smokeless tobacco. He reports current alcohol use. He reports that he does not use drugs.  Allergies  Allergen Reactions   Cefadroxil  Dermatitis   Lisinopril  Cough   Nsaids     Bleeding internally    Family History  Problem Relation Age of Onset   Heart disease Mother    Heart disease Father    Lung cancer Paternal Uncle    Colon cancer Neg Hx    Esophageal cancer Neg Hx    Ovarian cancer Neg Hx    Pancreatic cancer Neg Hx    Stomach cancer Neg Hx    Father deceased age 29 from an acute MI.  Mother deceased age 22 from complications from hip fracture.  Prior to Admission medications   Medication Sig  Start Date End Date Taking? Authorizing Provider  acetaminophen  (TYLENOL ) 650 MG CR tablet Take 650 mg by mouth 2 (two) times daily. 10/02/23  Yes [provider]  amLODipine  (NORVASC ) 5 MG tablet Take 5 mg by mouth at bedtime.   Yes [provider]  ascorbic acid  (VITAMIN C) 500 MG tablet Take 500 mg by mouth in the morning.   Yes [provider]  atorvastatin  (LIPITOR) 40 MG tablet Take 40 mg by mouth at bedtime.  12/06/17  Yes [provider]  Calcium  Carb-Cholecalciferol  (CALCIUM  600 + D PO) Take 1 capsule by mouth in the morning.   Yes [provider]  cholecalciferol  (VITAMIN D3) 25 MCG (1000 UNIT) tablet Take 1,000 Units by mouth in the morning.   Yes [provider]  Cyanocobalamin  (B-12) 1000 MCG TABS Take 1 tablet by mouth in the morning.   Yes [provider]  ferrous sulfate  325 (65 FE) MG tablet Take 325 mg by mouth every Monday, Wednesday, and Friday.   Yes [provider]  finasteride  (PROSCAR ) 5 MG tablet Take 5 mg by mouth at bedtime.   Yes [provider]  furosemide  (LASIX ) 20 MG tablet Take 20 mg by mouth as needed for fluid.   Yes [provider]  hydroxychloroquine  (PLAQUENIL ) 200 MG tablet Take 400 mg by mouth at bedtime.   Yes [provider]  losartan  (COZAAR ) 100 MG tablet Take 100 mg by mouth at bedtime.   Yes [provider]  MELATONIN PO Take 8 mg by mouth at bedtime.   Yes [provider]  pantoprazole  (PROTONIX ) 40 MG tablet TAKE 1 TABLET(40 MG) BY MOUTH TWICE DAILY 03/25/23  Yes Cirigliano, Vito V, DO  predniSONE  (DELTASONE ) 5 MG tablet Take 5 mg by mouth daily with breakfast.   Yes [provider]  senna (SENOKOT) 8.6 MG tablet Take 1 tablet by mouth at bedtime.   Yes [provider]  XARELTO  20 MG TABS tablet Take 20 mg by mouth in the morning. 02/20/19  Yes [provider]  zinc  gluconate 50 MG tablet Take 50 mg by mouth in  the morning.   Yes [provider]    Physical Exam: Vitals:   10/24/23 1600 10/24/23 1608 10/24/23 1723 10/24/23 1729  BP: 104/65   120/77  Pulse: 69   66  Resp: 15   20  Temp:  98.6 F (37 C)  98.4 F (36.9 C)  TempSrc:  Oral  Oral  SpO2: 93%   96%  Height:   6' 1 (1.854 m)     Constitutional: NAD, calm, comfortable Vitals:   10/24/23 1600 10/24/23 1608 10/24/23 1723 10/24/23 1729  BP: 104/65   120/77  Pulse: 69   66  Resp: 15   20  Temp:  98.6 F (37 C)  98.4 F (36.9 C)  TempSrc:  Oral  Oral  SpO2: 93%   96%  Height:   6' 1 (1.854 m)    Eyes: PERRL, lids and conjunctivae normal ENMT: Mucous membranes are dry. Posterior pharynx clear of any exudate or lesions.Normal dentition.  Neck: normal, supple, no masses, no thyromegaly Respiratory: clear to auscultation bilaterally, no wheezing, no crackles. Normal respiratory effort. No accessory muscle use.  Cardiovascular: Regular rate and rhythm, no murmurs / rubs / gallops. No extremity edema. 2+ pedal pulses. No carotid bruits.  Abdomen: no tenderness, no masses palpated. No hepatosplenomegaly. Bowel sounds positive.  Musculoskeletal: no clubbing / cyanosis. No joint deformity upper and lower extremities. Good ROM, no contractures. Normal muscle tone.  Skin: no rashes, lesions, ulcers. No induration Neurologic: CN 2-12 grossly intact. Sensation intact, DTR normal. Strength 5/5 in all 4.  Psychiatric: Normal judgment and insight. Alert and oriented x 3. Normal mood.   Labs on Admission: I have personally reviewed following labs and imaging studies  CBC: Recent Labs  Lab 10/24/23 1031  WBC 9.2  NEUTROABS 8.3*  HGB 12.2*  HCT 36.6*  MCV 93.6  PLT 241    Basic Metabolic Panel: Recent Labs  Lab 10/24/23 1031  NA 140  K 3.7  CL 103  CO2 27  GLUCOSE 104*  BUN 24*  CREATININE 1.03  CALCIUM  9.8    GFR: CrCl cannot be calculated (Unknown ideal weight.).  Liver Function Tests: Recent Labs  Lab  10/24/23 1031  AST 23  ALT 22  ALKPHOS 57  BILITOT 1.0  PROT 6.1*  ALBUMIN 3.9    Urine analysis:    Component Value Date/Time   COLORURINE YELLOW 10/24/2023 1229   APPEARANCEUR CLEAR 10/24/2023 1229   LABSPEC >1.046 (H) 10/24/2023 1229   PHURINE 6.0 10/24/2023 1229   GLUCOSEU NEGATIVE 10/24/2023 1229   HGBUR TRACE (A) 10/24/2023 1229   BILIRUBINUR NEGATIVE 10/24/2023 1229   KETONESUR 15 (A) 10/24/2023 1229   PROTEINUR 100 (A) 10/24/2023 1229   NITRITE NEGATIVE 10/24/2023 1229   LEUKOCYTESUR NEGATIVE 10/24/2023 1229    Radiological Exams on Admission: CT ABDOMEN PELVIS W CONTRAST Result Date: 10/24/2023 CLINICAL DATA:  Abdominal pain, acute, nonlocalized. History of colon cancer. * Tracking Code: BO * EXAM: CT ABDOMEN AND PELVIS WITH CONTRAST TECHNIQUE: Multidetector CT imaging of the abdomen and pelvis was performed using the standard protocol following bolus administration of intravenous contrast. RADIATION DOSE REDUCTION: This exam was performed according to the departmental dose-optimization program which includes automated exposure control, adjustment of the mA and/or kV according to patient size and/or use of iterative reconstruction technique. CONTRAST:  OMNIPAQUE  IOHEXOL  300 MG/ML  SOLN COMPARISON:  CT scan abdomen and pelvis from 07/13/2023. FINDINGS: Lower chest: There are subpleural atelectatic changes in the visualized lung bases. No overt consolidation. No pleural effusion. The heart is normal in size. No pericardial effusion. Hepatobiliary: The liver is normal in size. Non-cirrhotic configuration. No suspicious mass. No intra or extrahepatic bile duct dilation. Pneumobilia noted in the left hepatic lobe, related to Whipple surgery. Gallbladder is surgically absent. Pancreas: Surgically absent pancreatic head and uncinate process. Grossly unremarkable pancreatic body and tail. Main pancreatic duct is not dilated. No peripancreatic fat stranding. Spleen: Within normal  limits. No focal lesion. Adrenals/Urinary Tract: Adrenal glands are unremarkable. No suspicious renal mass. No hydronephrosis. No renal or ureteric calculi. Unremarkable urinary bladder. Stomach/Bowel: Patient is status post right hemicolectomy. Ileocolonic anastomosis noted in the right paramedian upper quadrant, anteriorly. No disproportionate dilation of the small or large bowel loops. No evidence of  abnormal bowel wall thickening or inflammatory changes. There are multiple diverticula mainly in the left hemi colon, without imaging signs of diverticulitis. Vascular/Lymphatic: No ascites or pneumoperitoneum. No abdominal or pelvic lymphadenopathy, by size criteria. No aneurysmal dilation of the major abdominal arteries. There are mild peripheral atherosclerotic vascular calcifications of the aorta and its major branches. Reproductive: Enlarged prostate. Symmetric seminal vesicles. Correlation with serum PSA level is recommended. Other: Midline surgical scar noted. There is small fat containing right inguinal hernia. The soft tissues and abdominal wall are otherwise unremarkable. Musculoskeletal: No suspicious osseous lesions. There are mild multilevel degenerative changes in the visualized spine. IMPRESSION: 1. No acute inflammatory process identified within the abdomen or pelvis. No metastatic disease identified within the abdomen or pelvis. 2. Multiple other nonacute observations, as described above. Aortic Atherosclerosis (ICD10-I70.0). Electronically Signed   By: Ree Molt M.D.   On: 10/24/2023 12:20   DG Chest Port 1 View Result Date: 10/24/2023 CLINICAL DATA:  Generalized body aches, fever. EXAM: PORTABLE CHEST 1 VIEW COMPARISON:  July 13, 2023. FINDINGS: The heart size and mediastinal contours are within normal limits. Both lungs are clear. The visualized skeletal structures are unremarkable. IMPRESSION: No active disease. Electronically Signed   By: Lynwood Landy Raddle M.D.   On: 10/24/2023 11:31     EKG: Independently reviewed.  Normal sinus rhythm.  Occasional PVCs.  Assessment/Plan Principal Problem:   Fever Active Problems:   Essential (primary) hypertension   History of Whipple procedure   Long term (current) use of anticoagulants   Nephrotic syndrome with membranous glomerulonephritis   Recurrent pulmonary embolism (HCC)   Pure hypercholesterolemia   Benign prostatic hyperplasia with lower urinary tract symptoms   Chronic kidney disease   Seronegative rheumatoid arthritis (HCC)    #1 fever/nausea vomiting -Patient presenting with fever with temps as high as 103.2 prior to admission.  Patient with prior history of recurrent E. coli bacteremia and per wife recent bacteremia in April 2025 in May 2024. - Patient on presentation to the ED with a temp of 102.1. - Chest x-ray negative for any acute infiltrate. - Urinalysis unremarkable. - Blood cultures ordered and pending. - Patient noted to have received IV Rocephin  in the ED. - Placed empirically on IV cefepime  pending finalization of blood culture results. -Trial of clears and advance as tolerated to soft diet. - IV fluids, IV antiemetics, supportive care.  2.  Hypertension -BP noted to be soft/borderline on presentation. - Hold antihypertensive medications. - Continue hydration with IV fluids.  3.  CKD stage II/membranous nephropathy - Renal function currently stable. - ARB on hold and will resume once blood pressure stabilizes and is improved. - Outpatient follow-up with primary nephrologist.  4.  GERD -PPI.  5.  History of seronegative rheumatoid arthritis -Due to concern for acute infection we will hold Plaquenil  for now. - Will double home dose prednisone  to 10 mg daily. - Outpatient follow-up with primary rheumatologist.  6.  History of PE x 2 -Patient with history of unprovoked PE x 2. - Continue home regimen Xarelto .  7.  Hyperlipidemia -Hold statin.  8.  History of insulinoma status post  Whipple procedure -Outpatient follow-up.  9.  BPH -Continue home regimen Proscar .  10.  Dehydration -IV fluids.   DVT prophylaxis: Xarelto  Code Status:   Full Family Communication:  Updated patient and wife at bedside. Disposition Plan:   Patient is from:  Home  Anticipated DC to:  Home  Anticipated DC date:  TBD  Anticipated DC  barriers: Home when clinically improved, afebrile, finalization of blood cultures.  Consults called:  None Admission status: Place in observation  Severity of Illness: The appropriate patient status for this patient is OBSERVATION. Observation status is judged to be reasonable and necessary in order to provide the required intensity of service to ensure the patient's safety. The patient's presenting symptoms, physical exam findings, and initial radiographic and laboratory data in the context of their medical condition is felt to place them at decreased risk for further clinical deterioration. Furthermore, it is anticipated that the patient will be medically stable for discharge from the hospital within 2 midnights of admission.     Toribio Hummer MD [facility]  How to contact the [facility] Attending or Consulting provider 7A - 7P or covering provider during after hours 7P -7A, for this patient?   Check the care team in [facility] and look for a) attending/consulting [facility] provider listed and b) the [facility] team listed Log into [email or web address] and use [place]'s universal password to access. If you do not have the password, please contact the hospital operator. Locate the [facility] provider you are looking for under [facility] and page to a number that you can be directly reached. If you still have difficulty reaching the provider, please page the [facility] (Director on Call) for the Hospitalists listed on amion for assistance.  10/24/2023, 6:54 PM

## 2023-10-24 NOTE — Plan of Care (Signed)
 Pt admitted to the unit. A&OX4 and VSS. Pt afebrile and hospitalist notified of pts arrival.  Problem: Activity: Goal: Risk for activity intolerance will decrease Outcome: Progressing   Problem: Nutrition: Goal: Adequate nutrition will be maintained Outcome: Progressing   Problem: Pain Managment: Goal: General experience of comfort will improve and/or be controlled Outcome: Progressing   Problem: Safety: Goal: Ability to remain free from injury will improve Outcome: Progressing

## 2023-10-24 NOTE — Progress Notes (Signed)
 Plan of Care Note for accepted transfer   Patient: Stephen Gross MRN: [id number]   DOA: 10/24/2023  Facility requesting transfer: [facility] Requesting Provider: Wonda Simpers EDP Reason for transfer: fever Facility course:  78 year old male complicated patient including procedure 2002, subsequent spontaneous E. coli bacteremia multiple times.  This evening.  Presented with fever, nausea, vomiting.  Workup was reassuring given fever and clinical history, observation requested for concern for possible bacteremia   Fever 102 Vital signs otherwise stable CMP unremarkable Lactic acid WNL Normal WBC Hgb stable 12.2 Urinalysis unrevealing BC pending  Plan of care: The patient is accepted for admission to Med-surg  unit, at [facility]..   Patient remains under care of EDP until arrival at [facility]. Plan for empiric antibiotic and follow-up blood cultures, monitor for sepsis.  Author: Toribio Door, MD 10/24/2023  Check [email or web address] for on-call coverage.  Nursing staff, Please call [facility] Admits & Consults System-Wide number on Amion as soon as patient's arrival, so appropriate admitting provider can evaluate the pt.

## 2023-10-25 ENCOUNTER — Encounter (HOSPITAL_COMMUNITY): Payer: Self-pay | Admitting: Family Medicine

## 2023-10-25 DIAGNOSIS — R509 Fever, unspecified: Secondary | ICD-10-CM | POA: Diagnosis not present

## 2023-10-25 DIAGNOSIS — Z90410 Acquired total absence of pancreas: Secondary | ICD-10-CM

## 2023-10-25 DIAGNOSIS — E860 Dehydration: Secondary | ICD-10-CM | POA: Diagnosis present

## 2023-10-25 DIAGNOSIS — I2699 Other pulmonary embolism without acute cor pulmonale: Secondary | ICD-10-CM | POA: Diagnosis not present

## 2023-10-25 DIAGNOSIS — Z79899 Other long term (current) drug therapy: Secondary | ICD-10-CM | POA: Diagnosis not present

## 2023-10-25 DIAGNOSIS — K219 Gastro-esophageal reflux disease without esophagitis: Secondary | ICD-10-CM | POA: Diagnosis present

## 2023-10-25 DIAGNOSIS — N042 Nephrotic syndrome with diffuse membranous glomerulonephritis, unspecified: Secondary | ICD-10-CM

## 2023-10-25 DIAGNOSIS — E7800 Pure hypercholesterolemia, unspecified: Secondary | ICD-10-CM

## 2023-10-25 DIAGNOSIS — Z7952 Long term (current) use of systemic steroids: Secondary | ICD-10-CM | POA: Diagnosis not present

## 2023-10-25 DIAGNOSIS — Z7901 Long term (current) use of anticoagulants: Secondary | ICD-10-CM | POA: Diagnosis not present

## 2023-10-25 DIAGNOSIS — Z90411 Acquired partial absence of pancreas: Secondary | ICD-10-CM | POA: Diagnosis not present

## 2023-10-25 DIAGNOSIS — Z9049 Acquired absence of other specified parts of digestive tract: Secondary | ICD-10-CM | POA: Diagnosis not present

## 2023-10-25 DIAGNOSIS — Z86711 Personal history of pulmonary embolism: Secondary | ICD-10-CM | POA: Diagnosis not present

## 2023-10-25 DIAGNOSIS — M0600 Rheumatoid arthritis without rheumatoid factor, unspecified site: Secondary | ICD-10-CM | POA: Diagnosis not present

## 2023-10-25 DIAGNOSIS — N182 Chronic kidney disease, stage 2 (mild): Secondary | ICD-10-CM

## 2023-10-25 DIAGNOSIS — Z87891 Personal history of nicotine dependence: Secondary | ICD-10-CM | POA: Diagnosis not present

## 2023-10-25 DIAGNOSIS — N401 Enlarged prostate with lower urinary tract symptoms: Secondary | ICD-10-CM

## 2023-10-25 DIAGNOSIS — Z8249 Family history of ischemic heart disease and other diseases of the circulatory system: Secondary | ICD-10-CM | POA: Diagnosis not present

## 2023-10-25 DIAGNOSIS — I10 Essential (primary) hypertension: Secondary | ICD-10-CM | POA: Diagnosis not present

## 2023-10-25 DIAGNOSIS — Z85038 Personal history of other malignant neoplasm of large intestine: Secondary | ICD-10-CM | POA: Diagnosis not present

## 2023-10-25 DIAGNOSIS — R112 Nausea with vomiting, unspecified: Secondary | ICD-10-CM | POA: Diagnosis present

## 2023-10-25 DIAGNOSIS — M353 Polymyalgia rheumatica: Secondary | ICD-10-CM | POA: Diagnosis present

## 2023-10-25 LAB — CBC WITH DIFFERENTIAL/PLATELET
Abs Immature Granulocytes: 0.03 K/uL (ref 0.00–0.07)
Basophils Absolute: 0 K/uL (ref 0.0–0.1)
Basophils Relative: 1 %
Eosinophils Absolute: 0.1 K/uL (ref 0.0–0.5)
Eosinophils Relative: 2 %
HCT: 33.7 % — ABNORMAL LOW (ref 39.0–52.0)
Hemoglobin: 10.7 g/dL — ABNORMAL LOW (ref 13.0–17.0)
Immature Granulocytes: 0 %
Lymphocytes Relative: 12 %
Lymphs Abs: 1 K/uL (ref 0.7–4.0)
MCH: 30.9 pg (ref 26.0–34.0)
MCHC: 31.8 g/dL (ref 30.0–36.0)
MCV: 97.4 fL (ref 80.0–100.0)
Monocytes Absolute: 1.2 K/uL — ABNORMAL HIGH (ref 0.1–1.0)
Monocytes Relative: 16 %
Neutro Abs: 5.6 K/uL (ref 1.7–7.7)
Neutrophils Relative %: 69 %
Platelets: 201 K/uL (ref 150–400)
RBC: 3.46 MIL/uL — ABNORMAL LOW (ref 4.22–5.81)
RDW: 13.6 % (ref 11.5–15.5)
WBC: 8 K/uL (ref 4.0–10.5)
nRBC: 0 % (ref 0.0–0.2)

## 2023-10-25 LAB — COMPREHENSIVE METABOLIC PANEL WITH GFR
ALT: 17 U/L (ref 0–44)
AST: 19 U/L (ref 15–41)
Albumin: 2.8 g/dL — ABNORMAL LOW (ref 3.5–5.0)
Alkaline Phosphatase: 38 U/L (ref 38–126)
Anion gap: 6 (ref 5–15)
BUN: 14 mg/dL (ref 8–23)
CO2: 28 mmol/L (ref 22–32)
Calcium: 8.9 mg/dL (ref 8.9–10.3)
Chloride: 104 mmol/L (ref 98–111)
Creatinine, Ser: 0.77 mg/dL (ref 0.61–1.24)
GFR, Estimated: >60 mL/min (ref >60)
Glucose, Bld: 107 mg/dL — ABNORMAL HIGH (ref 70–99)
Potassium: 4 mmol/L (ref 3.5–5.1)
Sodium: 138 mmol/L (ref 135–145)
Total Bilirubin: 0.9 mg/dL (ref 0.0–1.2)
Total Protein: 5 g/dL — ABNORMAL LOW (ref 6.5–8.1)

## 2023-10-25 LAB — PHOSPHORUS: Phosphorus: 2.7 mg/dL (ref 2.5–4.6)

## 2023-10-25 LAB — MAGNESIUM: Magnesium: 1.8 mg/dL (ref 1.7–2.4)

## 2023-10-25 MED ORDER — SENNA 8.6 MG PO TABS
1.0000 | ORAL_TABLET | Freq: Two times a day (BID) | ORAL | Status: DC
  Administered 2023-10-25 – 2023-10-27 (×4): 8.6 mg via ORAL
  Filled 2023-10-25 (×4): qty 1

## 2023-10-25 NOTE — Plan of Care (Signed)
  Problem: Clinical Measurements: Goal: Will remain free from infection Outcome: Progressing   Problem: Activity: Goal: Risk for activity intolerance will decrease Outcome: Progressing   Problem: Nutrition: Goal: Adequate nutrition will be maintained Outcome: Progressing   Problem: Coping: Goal: Level of anxiety will decrease Outcome: Progressing   Problem: Skin Integrity: Goal: Risk for impaired skin integrity will decrease Outcome: Progressing

## 2023-10-25 NOTE — Care Management Obs Status (Signed)
 MEDICARE OBSERVATION STATUS NOTIFICATION   Patient Details  Name: Stephen Gross MRN: [id number] Date of Birth: 1946-01-30   Medicare Observation Status Notification Given:  Yes    Toy LITTIE Agar, RN 10/25/2023, 3:00 PM

## 2023-10-25 NOTE — Progress Notes (Signed)
 PROGRESS NOTE    Stephen Gross  FMW:[id number] DOB: 03/19/1946 DOA: 10/24/2023 PCP: Rusty, Lonni HERO, MD   Chief Complaint  Patient presents with   Fever    Brief Narrative:  HPI : Stephen Gross is a 78 y.o. 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  Per wife patient had 2 episodes of bilious emesis with some associated diffuse bodyaches.  Per wife patient noted to have a fever 103.2 and patient subsequently presented to PCPs office where he was assessed and directed to the ED.  Patient endorses significant chills, lightheadedness, dizziness.  Patient denies any shortness of breath, no chest pain, no abdominal pain, no diarrhea, no constipation, no melena, no hematemesis, no hematochezia.  No recent travel however per wife patient did travel to [place]  in May.  Patient noted to have received 4 mg of Zofran  at PCPs office and currently without any further nausea or vomiting and feeling much better.   ED Course: Patient seen in the ED, initial blood pressure noted to be soft but responding to IV fluids.  Patient noted to have a temperature of 102.1.  Comprehensive metabolic profile with a glucose of 104, BUN of 24, protein of 6.1 otherwise within normal limits.  Lactic acid level noted at 1.1.  CBC with a hemoglobin of 12.2 otherwise within normal limits, ANC of 8.3.  Urinalysis done nitrite negative, leukocytes negative, specific gravity > 1.046.  Chest x-ray negative  for any acute infiltrates.  Blood cultures ordered and pending.  Patient noted to have received a dose of IV Rocephin  in the ED.   Assessment & Plan:   Principal Problem:   Fever Active Problems:   Essential (primary) hypertension   History of Whipple procedure   Long term (current) use of anticoagulants   Nephrotic syndrome with membranous glomerulonephritis   Recurrent pulmonary embolism (HCC)   Pure hypercholesterolemia   Benign prostatic hyperplasia with lower urinary tract symptoms   Chronic kidney disease   Seronegative rheumatoid arthritis (HCC)   #1 fever/nausea vomiting -Patient presented with fever with temps as high as 103.2 prior to admission.  Patient with prior history of recurrent E. coli bacteremia and per wife recent bacteremia in April 2025 in May 2024. - Patient on presentation to the ED with a temp of 102.1. - Chest x-ray negative for any acute infiltrate. - Urinalysis unremarkable. - Blood cultures ordered and pending. -Currently afebrile since admission. - Patient noted to have received IV Rocephin  in the ED. - Continue empiric IV cefepime  pending finalization of blood cultures. -Patient tolerated clear liquids and full liquid will advance to a regular diet. -Continue IV fluids, IV antiemetics, supportive care.   2.  Hypertension -BP noted to be soft/borderline on presentation. - Continue to hold antihypertensive medications. -IV fluids.   3.  CKD stage II/membranous nephropathy - Renal function currently stable. - ARB on hold and will resume once blood pressure stabilizes and is improved. - Outpatient follow-up with primary nephrologist.   4.  GERD - Continue PPI.   5.  History of seronegative rheumatoid arthritis -Due to concern  for acute infection, we will continue to hold Plaquenil  for now.  - Continue double home dose prednisone  to 10 mg daily. - Outpatient follow-up with primary rheumatologist.   6.  History of PE x 2 -Patient with history  of unprovoked PE x 2. - Continue home regimen Xarelto .   7.  Hyperlipidemia - Continue to hold statin.     8.  History of insulinoma status post Whipple procedure -Outpatient follow-up.   9.  BPH -Continue home regimen Proscar .   10.  Dehydration -IV fluids.     DVT prophylaxis: Xarelto  Code Status: Full Family Communication: Updated patient.  No family at bedside. Disposition: Likely home when clinically improved, and workup completed.  Status is: Observation The patient remains OBS appropriate and will d/c before 2 midnights.   Consultants:  None  Procedures:  CT abdomen and pelvis 10/24/2023 Chest x-ray 10/24/2023   Antimicrobials:  Anti-infectives (From admission, onward)    Start     Dose/Rate Route Frequency Ordered Stop   10/24/23 2000  ceFEPIme  (MAXIPIME ) 2 g in sodium chloride  0.9 % 100 mL IVPB        2 g 200 mL/hr over 30 Minutes Intravenous Every 8 hours 10/24/23 1913     10/24/23 1045  cefTRIAXone  (ROCEPHIN ) 2 g in sodium chloride  0.9 % 100 mL IVPB        2 g 200 mL/hr over 30 Minutes Intravenous  Once 10/24/23 1044 10/24/23 1138         Subjective: Patient sitting up in bed.  Denies any further nausea or vomiting.  No abdominal pain.  No chest pain.  No shortness of breath.  States he is hungry.  States he tolerated full liquids.  Objective: Vitals:   10/24/23 2046 10/25/23 0028 10/25/23 0142 10/25/23 0554  BP: 128/68 128/68 129/71 133/77  Pulse: 60 60 63 67  Resp: 20 20 20 20   Temp: 98.3 F (36.8 C) 98.3 F (36.8 C) 98.7 F (37.1 C) 98.6 F (37 C)  TempSrc: Oral Oral Oral Oral  SpO2: 96%  97% 99%  Weight:  80 kg    Height:  6' 1 (1.854 m)      Intake/Output Summary (Last 24 hours) at 10/25/2023 1040 Last data filed at 10/25/2023 0300 Gross per 24 hour  Intake 3263.8 ml  Output --  Net 3263.8 ml   Filed Weights   10/25/23 0028  Weight: 80 kg    Examination:  General exam: Appears calm and comfortable  Respiratory system:  Clear to auscultation. Respiratory effort normal. Cardiovascular system: S1 & S2 heard, RRR. No JVD, murmurs, rubs, gallops or clicks. No pedal edema. Gastrointestinal system: Abdomen is nondistended, soft and nontender. No organomegaly or masses felt. Normal bowel sounds heard. Central nervous system: Alert and oriented. No focal neurological deficits. Extremities: Symmetric 5 x 5 power. Skin: No rashes, lesions or ulcers Psychiatry: Judgement and insight appear normal. Mood & affect appropriate.     Data Reviewed: I have personally reviewed following labs and imaging studies  CBC: Recent Labs  Lab 10/24/23 1031 10/25/23 0528  WBC 9.2 8.0  NEUTROABS 8.3* 5.6  HGB 12.2* 10.7*  HCT 36.6* 33.7*  MCV 93.6 97.4  PLT 241 201    Basic Metabolic Panel: Recent Labs  Lab 10/24/23 1031 10/25/23 0528  NA 140 138  K 3.7 4.0  CL 103 104  CO2 27 28  GLUCOSE 104* 107*  BUN 24* 14  CREATININE 1.03 0.77  CALCIUM  9.8 8.9  MG  --  1.8  PHOS  --  2.7    GFR: Estimated Creatinine Clearance: 87.4 mL/min (by C-G formula based on SCr of 0.77 mg/dL).  Liver Function Tests: Recent Labs  Lab 10/24/23 1031 10/25/23 0528  AST 23 19  ALT 22 17  ALKPHOS 57 38  BILITOT 1.0 0.9  PROT 6.1* 5.0*  ALBUMIN 3.9 2.8*    CBG: No results for input(s): GLUCAP in the last 168 hours.   Recent Results (from the past 240 hours)  Blood Culture (routine x 2)     Status: None (Preliminary result)   Collection Time: 10/24/23 10:24 AM   Specimen: BLOOD RIGHT ARM  Result Value Ref Range Status   Specimen Description   Final    BLOOD RIGHT ARM Performed at [facility], [address] [place], [place] [address]    Special Requests   Final    BOTTLES DRAWN AEROBIC AND ANAEROBIC Blood Culture adequate volume Performed at [facility], [address] [place], [place] [address]    Culture   Final    NO GROWTH < 24 HOURS Performed at [facility], [address] [place], [place] [address]    Report Status PENDING  Incomplete  Resp panel by RT-PCR (RSV, Flu A&B, Covid) Anterior Nasal Swab     Status: None   Collection Time: 10/24/23 10:31 AM   Specimen: Anterior Nasal Swab  Result Value Ref Range Status   SARS Coronavirus 2 by RT PCR NEGATIVE NEGATIVE Final    Comment: (NOTE) SARS-CoV-2 target nucleic acids are NOT DETECTED.  The SARS-CoV-2 RNA is generally detectable in upper respiratory specimens during the acute phase of infection. The lowest concentration of SARS-CoV-2 viral copies this assay can detect is 138 copies/mL. A negative result does not preclude SARS-Cov-2 infection and should not be used as the sole basis for treatment or other patient management decisions. A negative result may occur with  improper specimen collection/handling, submission of specimen other than nasopharyngeal swab, presence of viral mutation(s) within the areas targeted by this assay, and inadequate number of viral copies(<138 copies/mL). A negative result must be combined with clinical observations, patient history, and epidemiological information. The expected result is Negative.  Fact Sheet for Patients:  BloggerCourse.com  Fact Sheet for Healthcare Providers:  SeriousBroker.it  This test is no t yet approved or cleared by the [place]  FDA and  has been authorized for detection and/or diagnosis of SARS-CoV-2 by FDA under an Emergency Use Authorization (EUA). This EUA will remain  in effect (meaning this test can be used) for the duration of the COVID-19 declaration under Section 564(b)(1) of the Act, 21 U.S.C.section 360bbb-3(b)(1), unless the authorization is terminated  or revoked sooner.       Influenza A by PCR NEGATIVE NEGATIVE Final   Influenza B by PCR NEGATIVE NEGATIVE Final    Comment: (NOTE) The Xpert Xpress SARS-CoV-2/FLU/RSV plus assay is intended as an aid in the diagnosis of  influenza from Nasopharyngeal swab specimens and should not be used as a sole basis for treatment. Nasal washings and aspirates are unacceptable for Xpert Xpress SARS-CoV-2/FLU/RSV testing.  Fact Sheet for Patients: BloggerCourse.com  Fact Sheet for Healthcare Providers: SeriousBroker.it  This test is not yet approved or cleared by the [place]  FDA and has been authorized for detection and/or diagnosis of SARS-CoV-2 by FDA under an Emergency Use Authorization (EUA). This EUA will remain in effect (meaning this test can be used) for the duration of the  COVID-19 declaration under Section 564(b)(1) of the Act, 21 U.S.C. section 360bbb-3(b)(1), unless the authorization is terminated or revoked.     Resp Syncytial Virus by PCR NEGATIVE NEGATIVE Final    Comment: (NOTE) Fact Sheet for Patients: BloggerCourse.com  Fact Sheet for Healthcare Providers: SeriousBroker.it  This test is not yet approved or cleared by the [place]  FDA and has been authorized for detection and/or diagnosis of SARS-CoV-2 by FDA under an Emergency Use Authorization (EUA). This EUA will remain in effect (meaning this test can be used) for the duration of the COVID-19 declaration under Section 564(b)(1) of the Act, 21 U.S.C. section 360bbb-3(b)(1), unless the authorization is terminated or revoked.  Performed at [facility], [address], [place], [place] [address]   Blood Culture (routine x 2)     Status: None (Preliminary result)   Collection Time: 10/24/23 11:03 AM   Specimen: BLOOD  Result Value Ref Range Status   Specimen Description   Final    BLOOD BLOOD LEFT ARM Performed at [facility], [address], [place], [place] [address]    Special Requests   Final    BOTTLES DRAWN AEROBIC AND ANAEROBIC Blood Culture adequate volume Performed at [facility], [address], [place], [place] [address]    Culture   Final    NO GROWTH < 24 HOURS Performed at [facility], [address], [place], [place] [address]    Report Status PENDING  Incomplete         Radiology Studies: CT ABDOMEN PELVIS W CONTRAST Result Date: 10/24/2023 CLINICAL DATA:  Abdominal pain, acute, nonlocalized. History of colon cancer. * Tracking Code: BO * EXAM: CT ABDOMEN AND PELVIS WITH CONTRAST TECHNIQUE: Multidetector CT imaging of the abdomen and pelvis was performed using the standard protocol following bolus administration of intravenous contrast. RADIATION DOSE REDUCTION: This exam was performed according to the departmental dose-optimization program which includes automated exposure control, adjustment of the mA and/or kV according to patient size and/or use of iterative reconstruction technique. CONTRAST:  OMNIPAQUE  IOHEXOL  300 MG/ML  SOLN COMPARISON:  CT scan abdomen and pelvis from 07/13/2023. FINDINGS: Lower chest: There are subpleural atelectatic changes in the visualized lung bases. No overt consolidation. No pleural effusion. The heart is normal in size. No pericardial effusion. Hepatobiliary: The liver is normal in size. Non-cirrhotic configuration. No suspicious mass. No intra or extrahepatic bile duct dilation. Pneumobilia noted in the left hepatic lobe, related to Whipple surgery. Gallbladder is surgically absent. Pancreas: Surgically absent pancreatic head and uncinate process. Grossly unremarkable pancreatic body and tail. Main pancreatic duct is not dilated. No peripancreatic fat stranding. Spleen: Within normal limits. No focal lesion. Adrenals/Urinary Tract: Adrenal glands are unremarkable. No suspicious renal mass. No hydronephrosis. No renal or ureteric calculi. Unremarkable urinary bladder. Stomach/Bowel: Patient is status post right hemicolectomy. Ileocolonic anastomosis noted in the right paramedian upper quadrant,  anteriorly. No disproportionate dilation of the small or large bowel loops. No evidence of abnormal bowel wall thickening or inflammatory changes. There are multiple diverticula mainly in the left hemi colon, without imaging signs of diverticulitis. Vascular/Lymphatic: No ascites or pneumoperitoneum. No abdominal or pelvic lymphadenopathy, by size criteria. No aneurysmal dilation of the major abdominal arteries. There are mild peripheral atherosclerotic vascular calcifications of the aorta and its major branches. Reproductive: Enlarged prostate. Symmetric seminal vesicles. Correlation with serum PSA level is recommended. Other: Midline surgical scar noted. There is small fat containing right inguinal hernia. The soft tissues  and abdominal wall are otherwise unremarkable. Musculoskeletal: No suspicious osseous lesions. There are mild multilevel degenerative changes in the visualized spine. IMPRESSION: 1. No acute inflammatory process identified within the abdomen or pelvis. No metastatic disease identified within the abdomen or pelvis. 2. Multiple other nonacute observations, as described above. Aortic Atherosclerosis (ICD10-I70.0). Electronically Signed   By: Ree Molt M.D.   On: 10/24/2023 12:20   DG Chest Port 1 View Result Date: 10/24/2023 CLINICAL DATA:  Generalized body aches, fever. EXAM: PORTABLE CHEST 1 VIEW COMPARISON:  July 13, 2023. FINDINGS: The heart size and mediastinal contours are within normal limits. Both lungs are clear. The visualized skeletal structures are unremarkable. IMPRESSION: No active disease. Electronically Signed   By: Lynwood Landy Raddle M.D.   On: 10/24/2023 11:31        Scheduled Meds:  cholecalciferol   1,000 Units Oral Daily   cyanocobalamin   1,000 mcg Oral Daily   ferrous sulfate   325 mg Oral Q M,W,F   finasteride   5 mg Oral QHS   pantoprazole  (PROTONIX ) IV  40 mg Intravenous Q12H   predniSONE   10 mg Oral Q breakfast   rivaroxaban   20 mg Oral q AM   senna  1  tablet Oral QHS   sodium chloride  flush  3 mL Intravenous Q12H   Continuous Infusions:  ceFEPime  (MAXIPIME ) IV 2 g (10/25/23 0604)     LOS: 0 days    Time spent: 40 minutes    Toribio Hummer, MD [facility]   To contact the attending provider between 7A-7P or the covering provider during after hours 7P-7A, please log into the web site [email or web address] and access using universal [place] password for that web site. If you do not have the password, please call the hospital operator.  10/25/2023, 10:40 AM

## 2023-10-25 NOTE — Progress Notes (Signed)
   10/25/23 1446  TOC Brief Assessment  Insurance and Status Reviewed  Patient has primary care physician Yes (Street, Lonni HERO, MD)  Home environment has been reviewed Home  Prior level of function: Independent  Prior/Current Home Services No current home services  Social Drivers of Health Review SDOH reviewed no interventions necessary  Readmission risk has been reviewed Yes  Transition of care needs no transition of care needs at this time

## 2023-10-25 NOTE — Plan of Care (Signed)

## 2023-10-26 DIAGNOSIS — R509 Fever, unspecified: Secondary | ICD-10-CM | POA: Diagnosis not present

## 2023-10-26 DIAGNOSIS — N401 Enlarged prostate with lower urinary tract symptoms: Secondary | ICD-10-CM | POA: Diagnosis not present

## 2023-10-26 DIAGNOSIS — N042 Nephrotic syndrome with diffuse membranous glomerulonephritis, unspecified: Secondary | ICD-10-CM | POA: Diagnosis not present

## 2023-10-26 DIAGNOSIS — Z7901 Long term (current) use of anticoagulants: Secondary | ICD-10-CM | POA: Diagnosis not present

## 2023-10-26 LAB — CBC WITH DIFFERENTIAL/PLATELET
Abs Immature Granulocytes: 0.02 K/uL (ref 0.00–0.07)
Basophils Absolute: 0 K/uL (ref 0.0–0.1)
Basophils Relative: 1 %
Eosinophils Absolute: 0.2 K/uL (ref 0.0–0.5)
Eosinophils Relative: 3 %
HCT: 35.4 % — ABNORMAL LOW (ref 39.0–52.0)
Hemoglobin: 11.3 g/dL — ABNORMAL LOW (ref 13.0–17.0)
Immature Granulocytes: 0 %
Lymphocytes Relative: 18 %
Lymphs Abs: 1.2 K/uL (ref 0.7–4.0)
MCH: 31 pg (ref 26.0–34.0)
MCHC: 31.9 g/dL (ref 30.0–36.0)
MCV: 97 fL (ref 80.0–100.0)
Monocytes Absolute: 1.2 K/uL — ABNORMAL HIGH (ref 0.1–1.0)
Monocytes Relative: 19 %
Neutro Abs: 3.7 K/uL (ref 1.7–7.7)
Neutrophils Relative %: 59 %
Platelets: 222 K/uL (ref 150–400)
RBC: 3.65 MIL/uL — ABNORMAL LOW (ref 4.22–5.81)
RDW: 13.2 % (ref 11.5–15.5)
WBC: 6.3 K/uL (ref 4.0–10.5)
nRBC: 0 % (ref 0.0–0.2)

## 2023-10-26 LAB — BASIC METABOLIC PANEL WITH GFR
Anion gap: 6 (ref 5–15)
BUN: 16 mg/dL (ref 8–23)
CO2: 27 mmol/L (ref 22–32)
Calcium: 9 mg/dL (ref 8.9–10.3)
Chloride: 105 mmol/L (ref 98–111)
Creatinine, Ser: 0.82 mg/dL (ref 0.61–1.24)
GFR, Estimated: >60 mL/min (ref >60)
Glucose, Bld: 121 mg/dL — ABNORMAL HIGH (ref 70–99)
Potassium: 4.4 mmol/L (ref 3.5–5.1)
Sodium: 138 mmol/L (ref 135–145)

## 2023-10-26 NOTE — Progress Notes (Signed)
 PROGRESS NOTE    Stephen Gross  FMW:[id number] DOB: 10/28/1945 DOA: 10/24/2023 PCP: Rusty, Lonni HERO, MD   Chief Complaint  Patient presents with   Fever    Brief Narrative:  HPI : Stephen Gross is a 78 y.o. 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  Per wife patient had 2 episodes of bilious emesis with some associated diffuse bodyaches.  Per wife patient noted to have a fever 103.2 and patient subsequently presented to PCPs office where he was assessed and directed to the ED.  Patient endorses significant chills, lightheadedness, dizziness.  Patient denies any shortness of breath, no chest pain, no abdominal pain, no diarrhea, no constipation, no melena, no hematemesis, no hematochezia.  No recent travel however per wife patient did travel to [place]  in May.  Patient noted to have received 4 mg of Zofran  at PCPs office and currently without any further nausea or vomiting and feeling much better.   ED Course: Patient seen in the ED, initial blood pressure noted to be soft but responding to IV fluids.  Patient noted to have a temperature of 102.1.  Comprehensive metabolic profile with a glucose of 104, BUN of 24, protein of 6.1 otherwise within normal limits.  Lactic acid level noted at 1.1.  CBC with a hemoglobin of 12.2 otherwise within normal limits, ANC of 8.3.  Urinalysis done nitrite negative, leukocytes negative, specific gravity > 1.046.  Chest x-ray negative  for any acute infiltrates.  Blood cultures ordered and pending.  Patient noted to have received a dose of IV Rocephin  in the ED.   Assessment & Plan:   Principal Problem:   Fever Active Problems:   Essential (primary) hypertension   History of Whipple procedure   Long term (current) use of anticoagulants   Nephrotic syndrome with membranous glomerulonephritis   Recurrent pulmonary embolism (HCC)   Pure hypercholesterolemia   Benign prostatic hyperplasia with lower urinary tract symptoms   Chronic kidney disease   Seronegative rheumatoid arthritis (HCC)   #1 fever/nausea vomiting -Patient presented with fever with temps as high as 103.2 prior to admission.  Patient with prior history of recurrent E. coli bacteremia and per wife recent bacteremia in April 2025 in May 2024. - Patient on presentation to the ED with a temp of 102.1. - Chest x-ray negative for any acute infiltrate. - Urinalysis unremarkable. - Blood cultures ordered and pending with no growth to date. -Currently afebrile since admission. - Patient noted to have received IV Rocephin  in the ED. - Continue empiric IV cefepime  pending finalization of blood cultures. -Patient diet advanced and tolerating a regular diet.  -Continue IV antibiotics, supportive care.   2.  Hypertension -BP noted to be soft/borderline on presentation. - BP improved with hydration.   - Continue to hold antihypertensive medications.    3.  CKD stage II/membranous nephropathy - Renal function currently stable. - ARB on hold and will resume once blood pressure stabilizes and is improved. - Outpatient follow-up with primary nephrologist.   4.  GERD - PPI.     5.  History of  seronegative rheumatoid arthritis -Due to concern for acute infection, we will continue to hold Plaquenil  for now.  - Continue double home dose prednisone  10 mg daily. - Outpatient follow-up with primary rheumatologist.   6.  History of PE x 2 -Patient with history of  unprovoked PE x 2. - Xarelto .    7.  Hyperlipidemia - Statin on hold.  - Resume statin on discharge.   8.  History of insulinoma status post Whipple procedure -Outpatient follow-up.   9.  BPH - Continue Proscar .     10.  Dehydration - Hydrated.   - IV fluids discontinued.      DVT prophylaxis: Xarelto  Code Status: Full Family Communication: Updated patient and wife at bedside. Disposition: Likely home when clinically improved, and workup completed.  Status is: Inpatient    Consultants:  None  Procedures:  CT abdomen and pelvis 10/24/2023 Chest x-ray 10/24/2023   Antimicrobials:  Anti-infectives (From admission, onward)    Start     Dose/Rate Route Frequency Ordered Stop   10/24/23 2000  ceFEPIme  (MAXIPIME ) 2 g in sodium chloride  0.9 % 100 mL IVPB        2 g 200 mL/hr over 30 Minutes Intravenous Every 8 hours 10/24/23 1913     10/24/23 1045  cefTRIAXone  (ROCEPHIN ) 2 g in sodium chloride  0.9 % 100 mL IVPB        2 g 200 mL/hr over 30 Minutes Intravenous  Once 10/24/23 1044 10/24/23 1138         Subjective: Patient sitting up in bed getting ready to eat his lunch.  Denies any chest pain or shortness of breath.  No abdominal pain.  No nausea or vomiting.  No chills.  No fevers.  Overall feels close to his baseline.  Wife at bedside.    Objective: Vitals:   10/25/23 0142 10/25/23 0554 10/25/23 1436 10/26/23 0525  BP: 129/71 133/77 134/74 (!) 143/87  Pulse: 63 67 (!) 59 (!) 59  Resp: 20 20 20 16   Temp: 98.7 F (37.1 C) 98.6 F (37 C) (!) 97.5 F (36.4 C) 98.6 F (37 C)  TempSrc: Oral Oral Oral Oral  SpO2: 97% 99% 93% 97%  Weight:      Height:        Intake/Output Summary (Last 24 hours) at 10/26/2023 1231 Last data filed at 10/26/2023 1222 Gross per 24 hour  Intake 573.18 ml  Output --  Net 573.18 ml   Filed Weights   10/25/23 0028  Weight: 80 kg    Examination:  General exam: NAD Respiratory system: Lungs clear to auscultation bilaterally.   No wheezes, no crackles, no rhonchi.  Fair air movement.  Speaking in full sentences.   Cardiovascular system: Regular rate rhythm no murmurs rubs or gallops.  No JVD.  No lower extremity edema.  Gastrointestinal system: Abdomen is soft, nontender, nondistended, positive bowel sounds.  No rebound.  No guarding.  Central nervous system: Alert and oriented. No focal neurological deficits. Extremities: Symmetric 5 x 5 power. Skin: No rashes, lesions or ulcers Psychiatry: Judgement and insight appear normal. Mood & affect appropriate.     Data Reviewed: I have personally reviewed following labs and imaging studies  CBC: Recent Labs  Lab 10/24/23 1031 10/25/23 0528 10/26/23 0633  WBC 9.2 8.0 6.3  NEUTROABS 8.3* 5.6 3.7  HGB 12.2* 10.7* 11.3*  HCT 36.6* 33.7* 35.4*  MCV 93.6 97.4 97.0  PLT 241 201 222    Basic Metabolic Panel: Recent Labs  Lab 10/24/23  1031 10/25/23 0528 10/26/23 0633  NA 140 138 138  K 3.7 4.0 4.4  CL 103 104 105  CO2 27 28 27   GLUCOSE 104* 107* 121*  BUN 24* 14 16  CREATININE 1.03 0.77 0.82  CALCIUM  9.8 8.9 9.0  MG  --  1.8  --   PHOS  --  2.7  --     GFR: Estimated Creatinine Clearance: 85.3 mL/min (by C-G formula based on SCr of 0.82 mg/dL).  Liver Function Tests: Recent Labs  Lab 10/24/23 1031 10/25/23 0528  AST 23 19  ALT 22 17  ALKPHOS 57 38  BILITOT 1.0 0.9  PROT 6.1* 5.0*  ALBUMIN 3.9 2.8*    CBG: No results for input(s): GLUCAP in the last 168 hours.   Recent Results (from the past 240 hours)  Blood Culture (routine x 2)     Status: None (Preliminary result)   Collection Time: 10/24/23 10:24 AM   Specimen: BLOOD RIGHT ARM  Result Value Ref Range Status   Specimen Description   Final    BLOOD RIGHT ARM Performed at [facility], [address] [place], [place] [address]    Special Requests   Final    BOTTLES DRAWN AEROBIC AND ANAEROBIC Blood Culture adequate volume Performed at [facility], [address], [place], [place] [address]    Culture   Final    NO GROWTH 2 DAYS Performed at [facility], [address] [place], [place] [address]    Report Status PENDING  Incomplete  Resp panel by RT-PCR (RSV, Flu A&B, Covid) Anterior Nasal Swab     Status: None   Collection Time: 10/24/23 10:31 AM   Specimen: Anterior Nasal Swab  Result Value Ref Range Status   SARS Coronavirus 2 by RT PCR NEGATIVE NEGATIVE Final    Comment: (NOTE) SARS-CoV-2 target nucleic acids are NOT DETECTED.  The SARS-CoV-2 RNA is generally detectable in upper respiratory specimens during the acute phase of infection. The lowest concentration of SARS-CoV-2 viral copies this assay can detect is 138 copies/mL. A negative result does not preclude SARS-Cov-2 infection and should not be used as the sole basis for treatment or other patient management decisions. A negative result may occur with  improper specimen collection/handling, submission of specimen other than nasopharyngeal swab, presence of viral mutation(s) within the areas targeted by this assay, and inadequate number of viral copies(<138 copies/mL). A negative result must be combined with clinical observations, patient history, and epidemiological information. The expected result is Negative.  Fact Sheet for Patients:  BloggerCourse.com  Fact Sheet for Healthcare Providers:  SeriousBroker.it  This test is no t yet approved or cleared by the [place]  FDA and  has been authorized for detection and/or diagnosis of SARS-CoV-2 by FDA under an Emergency Use Authorization (EUA). This EUA will remain  in effect (meaning this test can be used) for the duration of the COVID-19 declaration under Section 564(b)(1) of the Act, 21 U.S.C.section 360bbb-3(b)(1), unless the authorization is terminated  or revoked sooner.       Influenza A by PCR NEGATIVE NEGATIVE Final   Influenza B by PCR  NEGATIVE NEGATIVE Final    Comment: (NOTE) The Xpert Xpress SARS-CoV-2/FLU/RSV plus assay is intended as an aid in the diagnosis of influenza from Nasopharyngeal swab specimens and should not be used as a sole basis for treatment. Nasal washings and aspirates are unacceptable for Xpert Xpress SARS-CoV-2/FLU/RSV testing.  Fact Sheet for Patients: BloggerCourse.com  Fact Sheet for Healthcare Providers: SeriousBroker.it  This test is not yet approved or cleared by the [place]  FDA and has been authorized for detection and/or diagnosis of SARS-CoV-2 by FDA under an Emergency Use Authorization (EUA). This EUA will remain in effect (meaning this test can be used) for the duration of the COVID-19 declaration under Section 564(b)(1) of the Act, 21 U.S.C. section 360bbb-3(b)(1), unless the authorization is terminated or revoked.     Resp Syncytial Virus by PCR NEGATIVE NEGATIVE Final    Comment: (NOTE) Fact Sheet for Patients: BloggerCourse.com  Fact Sheet for Healthcare Providers: SeriousBroker.it  This test is not yet approved or cleared by the [place]  FDA and has been authorized for detection and/or diagnosis of SARS-CoV-2 by FDA under an Emergency Use Authorization (EUA). This EUA will remain in effect (meaning this test can be used) for the duration of the COVID-19 declaration under Section 564(b)(1) of the Act, 21 U.S.C. section 360bbb-3(b)(1), unless the authorization is terminated or revoked.  Performed at [facility], [address] [place], [place] [address]   Blood Culture (routine x 2)     Status: None (Preliminary result)   Collection Time: 10/24/23 11:03 AM   Specimen: BLOOD LEFT ARM  Result Value Ref Range Status   Specimen Description   Final    BLOOD LEFT ARM Performed at [facility], [address] [place], [place]  [address]    Special Requests   Final    BOTTLES DRAWN AEROBIC AND ANAEROBIC Blood Culture adequate volume Performed at [facility], [address], [place], [place] [address]    Culture   Final    NO GROWTH 2 DAYS Performed at [facility], [address] [place], [place] [address]    Report Status PENDING  Incomplete         Radiology Studies: No results found.       Scheduled Meds:  cholecalciferol   1,000 Units Oral Daily   cyanocobalamin   1,000 mcg Oral Daily   ferrous sulfate   325 mg Oral Q M,W,F   finasteride   5 mg Oral QHS   pantoprazole  (PROTONIX ) IV  40 mg Intravenous Q12H   predniSONE   10 mg Oral Q breakfast   rivaroxaban   20 mg Oral q AM   senna  1 tablet Oral BID   sodium chloride  flush  3 mL Intravenous Q12H   Continuous Infusions:  ceFEPime  (MAXIPIME ) IV Stopped (10/26/23 0603)     LOS: 1 day    Time spent: 35 minutes    Toribio Hummer, MD [facility]   To contact the attending provider between 7A-7P or the covering provider during after hours 7P-7A, please log into the web site [email or web address] and access using universal Moore password for that web site. If you do not have the password, please call the hospital operator.  10/26/2023, 12:31 PM

## 2023-10-26 NOTE — Progress Notes (Signed)
 Mobility Specialist - Progress Note   10/26/23 1412  Mobility  Activity Ambulated independently in hallway  Level of Assistance Independent  Assistive Device None  Distance Ambulated (ft) 1000 ft  Mobility Referral Yes  Mobility visit 1 Mobility  Mobility Specialist Start Time (ACUTE ONLY) 1354  Mobility Specialist Stop Time (ACUTE ONLY) 1411  Mobility Specialist Time Calculation (min) (ACUTE ONLY) 17 min   Pt received in bed and agreeable to mobility. No complaints during session. Pt to bed after session with all needs met.    [facility]

## 2023-10-26 NOTE — Plan of Care (Signed)

## 2023-10-27 DIAGNOSIS — N401 Enlarged prostate with lower urinary tract symptoms: Secondary | ICD-10-CM | POA: Diagnosis not present

## 2023-10-27 DIAGNOSIS — R509 Fever, unspecified: Secondary | ICD-10-CM | POA: Diagnosis not present

## 2023-10-27 DIAGNOSIS — Z90410 Acquired total absence of pancreas: Secondary | ICD-10-CM | POA: Diagnosis not present

## 2023-10-27 DIAGNOSIS — I2699 Other pulmonary embolism without acute cor pulmonale: Secondary | ICD-10-CM | POA: Diagnosis not present

## 2023-10-27 LAB — CBC
HCT: 34.9 % — ABNORMAL LOW (ref 39.0–52.0)
Hemoglobin: 11.2 g/dL — ABNORMAL LOW (ref 13.0–17.0)
MCH: 30.9 pg (ref 26.0–34.0)
MCHC: 32.1 g/dL (ref 30.0–36.0)
MCV: 96.1 fL (ref 80.0–100.0)
Platelets: 255 K/uL (ref 150–400)
RBC: 3.63 MIL/uL — ABNORMAL LOW (ref 4.22–5.81)
RDW: 13.2 % (ref 11.5–15.5)
WBC: 5.5 K/uL (ref 4.0–10.5)
nRBC: 0 % (ref 0.0–0.2)

## 2023-10-27 LAB — BASIC METABOLIC PANEL WITH GFR
Anion gap: 8 (ref 5–15)
BUN: 16 mg/dL (ref 8–23)
CO2: 27 mmol/L (ref 22–32)
Calcium: 9.4 mg/dL (ref 8.9–10.3)
Chloride: 106 mmol/L (ref 98–111)
Creatinine, Ser: 0.75 mg/dL (ref 0.61–1.24)
GFR, Estimated: >60 mL/min (ref >60)
Glucose, Bld: 109 mg/dL — ABNORMAL HIGH (ref 70–99)
Potassium: 3.9 mmol/L (ref 3.5–5.1)
Sodium: 141 mmol/L (ref 135–145)

## 2023-10-27 MED ORDER — SENNOSIDES 8.6 MG PO TABS: 1.0000 | ORAL_TABLET | Freq: Two times a day (BID) | ORAL | Status: AC

## 2023-10-27 MED ORDER — POLYETHYLENE GLYCOL 3350 17 G PO PACK
17.0000 g | PACK | Freq: Every day | ORAL | Status: DC
  Administered 2023-10-27: 17 g via ORAL
  Filled 2023-10-27: qty 1

## 2023-10-27 MED ORDER — ACETAMINOPHEN ER 650 MG PO TBCR: 650.0000 mg | EXTENDED_RELEASE_TABLET | ORAL | Status: AC | PRN

## 2023-10-27 MED ORDER — POLYETHYLENE GLYCOL 3350 17 G PO PACK: 17.0000 g | PACK | Freq: Every day | ORAL | 0 refills | Status: AC | PRN

## 2023-10-27 MED ORDER — AMOXICILLIN 500 MG PO TABS: 500.0000 mg | ORAL_TABLET | Freq: Two times a day (BID) | ORAL | 0 refills | Status: AC

## 2023-10-27 MED ORDER — SORBITOL 70 % SOLN
30.0000 mL | Status: AC
  Administered 2023-10-27 (×2): 30 mL via ORAL
  Filled 2023-10-27 (×2): qty 30

## 2023-10-27 MED ORDER — SORBITOL 70 % SOLN: 30.0000 mL | Status: DC

## 2023-10-27 NOTE — Plan of Care (Signed)
  Problem: Education: Goal: Knowledge of General Education information will improve Description: Including pain rating scale, medication(s)/side effects and non-pharmacologic comfort measures 10/27/2023 1611 by Berkeley Verdie BRAVO, RN Outcome: Adequate for Discharge 10/27/2023 0929 by Berkeley Verdie BRAVO, RN Outcome: Progressing   Problem: Health Behavior/Discharge Planning: Goal: Ability to manage health-related needs will improve Outcome: Adequate for Discharge   Problem: Clinical Measurements: Goal: Ability to maintain clinical measurements within normal limits will improve Outcome: Adequate for Discharge Goal: Will remain free from infection Outcome: Adequate for Discharge Goal: Diagnostic test results will improve 10/27/2023 1611 by Berkeley Verdie BRAVO, RN Outcome: Adequate for Discharge 10/27/2023 0929 by Berkeley Verdie BRAVO, RN Outcome: Progressing Goal: Respiratory complications will improve Outcome: Adequate for Discharge Goal: Cardiovascular complication will be avoided Outcome: Adequate for Discharge   Problem: Activity: Goal: Risk for activity intolerance will decrease 10/27/2023 1611 by Berkeley Verdie BRAVO, RN Outcome: Adequate for Discharge 10/27/2023 [phone] by Berkeley Verdie BRAVO, RN Outcome: Progressing   Problem: Nutrition: Goal: Adequate nutrition will be maintained Outcome: Adequate for Discharge   Problem: Coping: Goal: Level of anxiety will decrease Outcome: Adequate for Discharge   Problem: Elimination: Goal: Will not experience complications related to bowel motility Outcome: Adequate for Discharge Goal: Will not experience complications related to urinary retention Outcome: Adequate for Discharge   Problem: Pain Managment: Goal: General experience of comfort will improve and/or be controlled Outcome: Adequate for Discharge   Problem: Safety: Goal: Ability to remain free from injury will improve Outcome: Adequate for Discharge   Problem: Skin Integrity: Goal: Risk for  impaired skin integrity will decrease Outcome: Adequate for Discharge   Problem: Safety: Goal: Ability to remain free from injury will improve Outcome: Adequate for Discharge

## 2023-10-27 NOTE — Plan of Care (Signed)
   Problem: Education: Goal: Knowledge of General Education information will improve Description: Including pain rating scale, medication(s)/side effects and non-pharmacologic comfort measures Outcome: Progressing   Problem: Clinical Measurements: Goal: Diagnostic test results will improve Outcome: Progressing   Problem: Activity: Goal: Risk for activity intolerance will decrease Outcome: Progressing

## 2023-10-27 NOTE — Discharge Summary (Signed)
 Physician Discharge Summary  Stephen Gross FMW:[id number] DOB: Mar 30, 1946 DOA: 10/24/2023  PCP: Stephen Gross HERO, MD  Admit date: 10/24/2023 Discharge date: 10/27/2023  Time spent: 60 minutes  Recommendations for Outpatient Follow-up:  Follow-up with Street, Gross HERO, MD in 2 weeks.  On follow-up finalization of blood cultures will need to be followed up upon.   Discharge Diagnoses:  Principal Problem:   Fever Active Problems:   Essential (primary) hypertension   History of Whipple procedure   Long term (current) use of anticoagulants   Nephrotic syndrome with membranous glomerulonephritis   Recurrent pulmonary embolism (HCC)   Pure hypercholesterolemia   Benign prostatic hyperplasia with lower urinary tract symptoms   Chronic kidney disease   Seronegative rheumatoid arthritis (HCC)   Discharge Condition: Stable and improved.  Diet recommendation: Regular  Filed Weights   10/25/23 0028  Weight: 80 kg    History of present illness:  Stephen Gross is a 78 y.o. 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  Per wife patient had 2 episodes of bilious emesis with some associated diffuse bodyaches.  Per wife patient noted to have a fever 103.2 and patient subsequently presented to PCPs office where he was assessed and directed to the ED.  Patient endorses significant chills, lightheadedness, dizziness.  Patient denies any shortness of breath, no chest pain, no  abdominal pain, no diarrhea, no constipation, no melena, no hematemesis, no hematochezia.  No recent travel however per wife patient did travel to [place]  in May.  Patient noted to have received 4 mg of Zofran  at PCPs office and currently without any further nausea or vomiting and feeling much better.   ED Course: Patient seen in the ED, initial blood pressure noted to be soft but responding to IV fluids.  Patient noted to have a temperature of 102.1.  Comprehensive metabolic profile with a glucose of 104, BUN of 24, protein of 6.1 otherwise within normal limits.  Lactic acid level noted at 1.1.  CBC with a hemoglobin of 12.2 otherwise within normal limits, ANC of 8.3.  Urinalysis done nitrite negative, leukocytes negative, specific gravity > 1.046.  Chest x-ray negative for any acute infiltrates.  Blood cultures ordered and pending.  Patient noted to have received a dose of IV Rocephin  in the ED.  Hospital Course:  #1 fever/nausea vomiting -Patient presented with fever with temps as high as 103.2 prior to admission.  Patient with prior history of recurrent E. coli bacteremia and per wife recent bacteremia in April 2025 in May 2024. - Patient on presentation to the ED with a temp of 102.1. - Chest x-ray negative for any acute infiltrate. - Urinalysis unremarkable. - Blood cultures ordered and pending with no growth to date x 3 days. - Patient remained afebrile throughout the hospitalization.  - Patient noted to have received IV Rocephin  in the ED. - Patient placed empirically on IV cefepime  during the hospitalization pending finalization of blood cultures.   - Patient's diet was advanced which she tolerated.   - Patient remained in stable and improved condition and will be discharged home  on 2 more days of amoxicillin  to complete a 5-day course of antibiotic treatment.   - Outpatient follow-up with PCP.   2.  Hypertension -BP noted to be soft/borderline on presentation. - BP improved with  hydration.   - Patient's antihypertensive medications were held during the hospitalization and will be resumed on discharge.    3.  CKD stage II/membranous nephropathy - Renal remained stable during the hospitalization.  - ARB was held during the hospitalization and will be resumed on discharge.  - Outpatient follow-up with primary nephrologist as previously scheduled.   4.  GERD - Patient maintained on PPI during the hospitalization.     5.  History of seronegative rheumatoid arthritis -Due to concern for acute infection, patient's home regimen Plaquenil  was held. -Patient's home regimen of prednisone  was doubled during the hospitalization and will be decreased back to home dose on discharge. - Outpatient follow-up with primary rheumatologist.   6.  History of PE x 2 -Patient with history of unprovoked PE x 2. - Patient maintained on home regimen Xarelto .    7.  Hyperlipidemia - Statin held during the hospitalization will be resumed on discharge.   8.  History of insulinoma status post Whipple procedure -Outpatient follow-up.   9.  BPH - Patient maintained on home regimen Proscar .     10.  Dehydration - Hydrated.   -Outpatient follow-up with PCP.    Procedures: CT abdomen and pelvis 10/24/2023 Chest x-ray 10/24/2023    Consultations: None  Discharge Exam: Vitals:   10/27/23 0553 10/27/23 1313  BP: 139/85 134/71  Pulse: (!) 52 62  Resp: 18 18  Temp: (!) 97.5 F (36.4 C) 98 F (36.7 C)  SpO2: 96% 98%    General: NAD Cardiovascular: RRR no murmurs rubs or gallops.  No JVD.  No lower extremity edema. Respiratory: CTAB.  No wheezes, no crackles, no rhonchi.  Fair air movement.  Speaking in full sentences.  Discharge Instructions   Discharge Instructions     Diet general   Complete by: As directed    Increase activity slowly   Complete by: As directed       Allergies as of 10/27/2023       Reactions   Cefadroxil  Dermatitis   Lisinopril Cough   Nsaids     Bleeding internally        Medication List     TAKE these medications    acetaminophen  650 MG CR tablet Commonly known as: TYLENOL  Take 1 tablet (650 mg total) by mouth every 4 (four) hours as needed for pain. What changed:  when to take this reasons to take this   amLODipine  5 MG tablet Commonly known as: NORVASC  Take 5 mg by mouth at bedtime.   amoxicillin  500 MG tablet Commonly known as: AMOXIL  Take 1 tablet (500 mg total) by mouth 2 (two) times daily for 2 days.   ascorbic acid  500 MG tablet Commonly known as: VITAMIN C Take 500 mg by mouth in the morning.   atorvastatin  40 MG tablet Commonly known as: LIPITOR Take 40 mg by mouth at bedtime.   B-12 1000 MCG Tabs Take 1 tablet by mouth in the morning.   CALCIUM  600 + D PO Take 1 capsule by mouth in the morning.   cholecalciferol  25 MCG (1000 UNIT) tablet Commonly known as: VITAMIN D3 Take 1,000 Units by mouth in the morning.   ferrous sulfate  325 (65 FE) MG tablet Take 325 mg by mouth every Monday, Wednesday, and Friday.  finasteride  5 MG tablet Commonly known as: PROSCAR  Take 5 mg by mouth at bedtime.   furosemide  20 MG tablet Commonly known as: LASIX  Take 20 mg by mouth as needed for fluid.   hydroxychloroquine  200 MG tablet Commonly known as: PLAQUENIL  Take 400 mg by mouth at bedtime.   losartan  100 MG tablet Commonly known as: COZAAR  Take 100 mg by mouth at bedtime.   MELATONIN PO Take 8 mg by mouth at bedtime.   pantoprazole  40 MG tablet Commonly known as: PROTONIX  TAKE 1 TABLET(40 MG) BY MOUTH TWICE DAILY   polyethylene glycol 17 g packet Commonly known as: MIRALAX  / GLYCOLAX  Take 17 g by mouth daily as needed for mild constipation.   predniSONE  5 MG tablet Commonly known as: DELTASONE  Take 5 mg by mouth daily with breakfast.   senna 8.6 MG tablet Commonly known as: SENOKOT Take 1 tablet (8.6 mg total) by mouth 2 (two) times daily. What changed: when to take this    Xarelto  20 MG Tabs tablet Generic drug: rivaroxaban  Take 20 mg by mouth in the morning.   zinc  gluconate 50 MG tablet Take 50 mg by mouth in the morning.       Allergies  Allergen Reactions   Cefadroxil  Dermatitis   Lisinopril Cough   Nsaids     Bleeding internally    Follow-up Information     Street, Gross HERO, MD. Schedule an appointment as soon as possible for a visit in 2 week(s).   Specialty: Family Medicine Contact information: [address] [place] [address] [phone]                  The results of significant diagnostics from this hospitalization (including imaging, microbiology, ancillary and laboratory) are listed below for reference.    Significant Diagnostic Studies: CT ABDOMEN PELVIS W CONTRAST Result Date: 10/24/2023 CLINICAL DATA:  Abdominal pain, acute, nonlocalized. History of colon cancer. * Tracking Code: BO * EXAM: CT ABDOMEN AND PELVIS WITH CONTRAST TECHNIQUE: Multidetector CT imaging of the abdomen and pelvis was performed using the standard protocol following bolus administration of intravenous contrast. RADIATION DOSE REDUCTION: This exam was performed according to the departmental dose-optimization program which includes automated exposure control, adjustment of the mA and/or kV according to patient size and/or use of iterative reconstruction technique. CONTRAST:  OMNIPAQUE  IOHEXOL  300 MG/ML  SOLN COMPARISON:  CT scan abdomen and pelvis from 07/13/2023. FINDINGS: Lower chest: There are subpleural atelectatic changes in the visualized lung bases. No overt consolidation. No pleural effusion. The heart is normal in size. No pericardial effusion. Hepatobiliary: The liver is normal in size. Non-cirrhotic configuration. No suspicious mass. No intra or extrahepatic bile duct dilation. Pneumobilia noted in the left hepatic lobe, related to Whipple surgery. Gallbladder is surgically absent. Pancreas: Surgically absent pancreatic head and  uncinate process. Grossly unremarkable pancreatic body and tail. Main pancreatic duct is not dilated. No peripancreatic fat stranding. Spleen: Within normal limits. No focal lesion. Adrenals/Urinary Tract: Adrenal glands are unremarkable. No suspicious renal mass. No hydronephrosis. No renal or ureteric calculi. Unremarkable urinary bladder. Stomach/Bowel: Patient is status post right hemicolectomy. Ileocolonic anastomosis noted in the right paramedian upper quadrant, anteriorly. No disproportionate dilation of the small or large bowel loops. No evidence of abnormal bowel wall thickening or inflammatory changes. There are multiple diverticula mainly in the left hemi colon, without imaging signs of diverticulitis. Vascular/Lymphatic: No ascites or pneumoperitoneum. No abdominal or pelvic lymphadenopathy, by size criteria. No aneurysmal dilation of the major  abdominal arteries. There are mild peripheral atherosclerotic vascular calcifications of the aorta and its major branches. Reproductive: Enlarged prostate. Symmetric seminal vesicles. Correlation with serum PSA level is recommended. Other: Midline surgical scar noted. There is small fat containing right inguinal hernia. The soft tissues and abdominal wall are otherwise unremarkable. Musculoskeletal: No suspicious osseous lesions. There are mild multilevel degenerative changes in the visualized spine. IMPRESSION: 1. No acute inflammatory process identified within the abdomen or pelvis. No metastatic disease identified within the abdomen or pelvis. 2. Multiple other nonacute observations, as described above. Aortic Atherosclerosis (ICD10-I70.0). Electronically Signed   By: Ree Molt M.D.   On: 10/24/2023 12:20   DG Chest Port 1 View Result Date: 10/24/2023 CLINICAL DATA:  Generalized body aches, fever. EXAM: PORTABLE CHEST 1 VIEW COMPARISON:  July 13, 2023. FINDINGS: The heart size and mediastinal contours are within normal limits. Both lungs are clear. The  visualized skeletal structures are unremarkable. IMPRESSION: No active disease. Electronically Signed   By: Lynwood Landy Raddle M.D.   On: 10/24/2023 11:31    Microbiology: Recent Results (from the past 240 hours)  Blood Culture (routine x 2)     Status: None (Preliminary result)   Collection Time: 10/24/23 10:24 AM   Specimen: BLOOD RIGHT ARM  Result Value Ref Range Status   Specimen Description   Final    BLOOD RIGHT ARM Performed at [facility], [address] [place], [place] [address]    Special Requests   Final    BOTTLES DRAWN AEROBIC AND ANAEROBIC Blood Culture adequate volume Performed at [facility], [address], [place], [place] [address]    Culture   Final    NO GROWTH 3 DAYS Performed at [facility], [address], [place], [place] [address]    Report Status PENDING  Incomplete  Resp panel by RT-PCR (RSV, Flu A&B, Covid) Anterior Nasal Swab     Status: None   Collection Time: 10/24/23 10:31 AM   Specimen: Anterior Nasal Swab  Result Value Ref Range Status   SARS Coronavirus 2 by RT PCR NEGATIVE NEGATIVE Final    Comment: (NOTE) SARS-CoV-2 target nucleic acids are NOT DETECTED.  The SARS-CoV-2 RNA is generally detectable in upper respiratory specimens during the acute phase of infection. The lowest concentration of SARS-CoV-2 viral copies this assay can detect is 138 copies/mL. A negative result does not preclude SARS-Cov-2 infection and should not be used as the sole basis for treatment or other patient management decisions. A negative result may occur with  improper specimen collection/handling, submission of specimen other than nasopharyngeal swab, presence of viral mutation(s) within the areas targeted by this assay, and inadequate number of viral copies(<138 copies/mL). A negative result must be combined with clinical observations, patient history, and epidemiological information. The expected result is Negative.  Fact  Sheet for Patients:  BloggerCourse.com  Fact Sheet for Healthcare Providers:  SeriousBroker.it  This test is no t yet approved or cleared by the [place]  FDA and  has been authorized for detection and/or diagnosis of SARS-CoV-2 by FDA under an Emergency Use Authorization (EUA). This EUA will remain  in effect (meaning this test can be used) for the duration of the COVID-19 declaration under Section 564(b)(1) of the Act, 21 U.S.C.section 360bbb-3(b)(1), unless the authorization is terminated  or revoked sooner.       Influenza A by PCR NEGATIVE NEGATIVE Final   Influenza B by PCR NEGATIVE NEGATIVE Final    Comment: (  NOTE) The Xpert Xpress SARS-CoV-2/FLU/RSV plus assay is intended as an aid in the diagnosis of influenza from Nasopharyngeal swab specimens and should not be used as a sole basis for treatment. Nasal washings and aspirates are unacceptable for Xpert Xpress SARS-CoV-2/FLU/RSV testing.  Fact Sheet for Patients: BloggerCourse.com  Fact Sheet for Healthcare Providers: SeriousBroker.it  This test is not yet approved or cleared by the [place]  FDA and has been authorized for detection and/or diagnosis of SARS-CoV-2 by FDA under an Emergency Use Authorization (EUA). This EUA will remain in effect (meaning this test can be used) for the duration of the COVID-19 declaration under Section 564(b)(1) of the Act, 21 U.S.C. section 360bbb-3(b)(1), unless the authorization is terminated or revoked.     Resp Syncytial Virus by PCR NEGATIVE NEGATIVE Final    Comment: (NOTE) Fact Sheet for Patients: BloggerCourse.com  Fact Sheet for Healthcare Providers: SeriousBroker.it  This test is not yet approved or cleared by the [place]  FDA and has been authorized for detection and/or diagnosis of SARS-CoV-2 by FDA under an  Emergency Use Authorization (EUA). This EUA will remain in effect (meaning this test can be used) for the duration of the COVID-19 declaration under Section 564(b)(1) of the Act, 21 U.S.C. section 360bbb-3(b)(1), unless the authorization is terminated or revoked.  Performed at [facility], [address] [place], [place] [address]   Blood Culture (routine x 2)     Status: None (Preliminary result)   Collection Time: 10/24/23 11:03 AM   Specimen: BLOOD LEFT ARM  Result Value Ref Range Status   Specimen Description   Final    BLOOD LEFT ARM Performed at [facility], [address] [place], [place] [address]    Special Requests   Final    BOTTLES DRAWN AEROBIC AND ANAEROBIC Blood Culture adequate volume Performed at [facility], [address], [place], [place] [address]    Culture   Final    NO GROWTH 3 DAYS Performed at [facility], [address] [place], [place] [address]    Report Status PENDING  Incomplete     Labs: Basic Metabolic Panel: Recent Labs  Lab 10/24/23 1031 10/25/23 0528 10/26/23 0633 10/27/23 0610  NA 140 138 138 141  K 3.7 4.0 4.4 3.9  CL 103 104 105 106  CO2 27 28 27 27   GLUCOSE 104* 107* 121* 109*  BUN 24* 14 16 16   CREATININE 1.03 0.77 0.82 0.75  CALCIUM  9.8 8.9 9.0 9.4  MG  --  1.8  --   --   PHOS  --  2.7  --   --    Liver Function Tests: Recent Labs  Lab 10/24/23 1031 10/25/23 0528  AST 23 19  ALT 22 17  ALKPHOS 57 38  BILITOT 1.0 0.9  PROT 6.1* 5.0*  ALBUMIN 3.9 2.8*   Recent Labs  Lab 10/24/23 1031  LIPASE 21   No results for input(s): AMMONIA in the last 168 hours. CBC: Recent Labs  Lab 10/24/23 1031 10/25/23 0528 10/26/23 0633 10/27/23 0610  WBC 9.2 8.0 6.3 5.5  NEUTROABS 8.3* 5.6 3.7  --   HGB 12.2* 10.7* 11.3* 11.2*  HCT 36.6* 33.7* 35.4* 34.9*  MCV 93.6 97.4 97.0 96.1  PLT 241 201 222 255   Cardiac Enzymes: No results for input(s): CKTOTAL,  CKMB, CKMBINDEX, TROPONINI in the last 168 hours. BNP: BNP (last 3 results) No results for input(s): BNP in the last 8760 hours.  ProBNP (  last 3 results) No results for input(s): PROBNP in the last 8760 hours.  CBG: No results for input(s): GLUCAP in the last 168 hours.     Signed:  Toribio Hummer MD.  [facility] 10/27/2023, 3:48 PM

## 2023-10-27 NOTE — Progress Notes (Signed)
 Mobility Specialist - Progress Note   10/27/23 1033  Mobility  Activity Ambulated independently in hallway  Level of Assistance Independent  Assistive Device None  Distance Ambulated (ft) 1000 ft  Activity Response Tolerated well  Mobility Referral Yes  Mobility visit 1 Mobility  Mobility Specialist Start Time (ACUTE ONLY) 1019  Mobility Specialist Stop Time (ACUTE ONLY) 1030  Mobility Specialist Time Calculation (min) (ACUTE ONLY) 11 min   Pt received in bed and agreeable to mobility. No complaints during session. Pt to bed after session with all needs met.    [facility]

## 2023-10-29 LAB — CULTURE, BLOOD (ROUTINE X 2)
Culture: NO GROWTH
Culture: NO GROWTH
Special Requests: ADEQUATE
Special Requests: ADEQUATE

## 2023-11-03 ENCOUNTER — Other Ambulatory Visit: Payer: Self-pay

## 2023-11-03 ENCOUNTER — Emergency Department (HOSPITAL_BASED_OUTPATIENT_CLINIC_OR_DEPARTMENT_OTHER)
Admission: EM | Admit: 2023-11-03 | Discharge: 2023-11-03 | Disposition: A | Discharge status: Home / Self Care | Attending: Emergency Medicine | Admitting: Emergency Medicine

## 2023-11-03 ENCOUNTER — Encounter (HOSPITAL_BASED_OUTPATIENT_CLINIC_OR_DEPARTMENT_OTHER): Payer: Self-pay

## 2023-11-03 DIAGNOSIS — Z7901 Long term (current) use of anticoagulants: Secondary | ICD-10-CM | POA: Insufficient documentation

## 2023-11-03 DIAGNOSIS — X58XXXA Exposure to other specified factors, initial encounter: Secondary | ICD-10-CM | POA: Diagnosis not present

## 2023-11-03 DIAGNOSIS — H1131 Conjunctival hemorrhage, right eye: Secondary | ICD-10-CM | POA: Diagnosis not present

## 2023-11-03 DIAGNOSIS — S0501XA Injury of conjunctiva and corneal abrasion without foreign body, right eye, initial encounter: Secondary | ICD-10-CM | POA: Insufficient documentation

## 2023-11-03 MED ORDER — ERYTHROMYCIN 5 MG/GM OP OINT: TOPICAL_OINTMENT | OPHTHALMIC | 0 refills | Status: AC

## 2023-11-03 MED ORDER — FLUORESCEIN SODIUM 1 MG OP STRP
1.0000 | ORAL_STRIP | Freq: Once | OPHTHALMIC | Status: DC
  Filled 2023-11-03: qty 1

## 2023-11-03 NOTE — ED Provider Notes (Signed)
 [place] EMERGENCY DEPARTMENT AT [facility] Provider Note   CSN: [id number] Arrival date & time: 11/03/23  1518     Patient presents with: Eye Problem   Stephen Gross is a 78 y.o. male.  Eye Problem Associated symptoms: redness    78 year old male to the ED today with for concerns for right eye subconjunctival hemorrhage which he noticed earlier today after having an eyelash fall into his eye, scratching out and then noticed it approximately 2 hours ago.  Currently taking Xarelto  secondary to recurrent PEs, recently discharged in the hospital for bacteremia and finished antibiotics course.  Denies headache, vision changes, pain with EOM, facial pain, fever, numbness, tingling, light sensitivity, rashes.    Prior to Admission medications   Medication Sig Start Date End Date Taking? Authorizing Provider  erythromycin  ophthalmic ointment Place a 1/2 inch ribbon of ointment into the lower eyelid. 11/03/23  Yes Mylinda Brook S, PA-C  acetaminophen  (TYLENOL ) 650 MG CR tablet Take 1 tablet (650 mg total) by mouth every 4 (four) hours as needed for pain. 10/27/23   Sebastian Toribio GAILS, MD  amLODipine  (NORVASC ) 5 MG tablet Take 5 mg by mouth at bedtime.    [provider]  ascorbic acid  (VITAMIN C) 500 MG tablet Take 500 mg by mouth in the morning.    [provider]  atorvastatin  (LIPITOR) 40 MG tablet Take 40 mg by mouth at bedtime.  12/06/17   [provider]  Calcium  Carb-Cholecalciferol  (CALCIUM  600 + D PO) Take 1 capsule by mouth in the morning.    [provider]  cholecalciferol  (VITAMIN D3) 25 MCG (1000 UNIT) tablet Take 1,000 Units by mouth in the morning.    [provider]  Cyanocobalamin  (B-12) 1000 MCG TABS Take 1 tablet by mouth in the morning.    [provider]  ferrous sulfate  325 (65 FE) MG tablet Take 325 mg by mouth every Monday, Wednesday, and Friday.    [provider]  finasteride  (PROSCAR ) 5 MG tablet  Take 5 mg by mouth at bedtime.    [provider]  furosemide  (LASIX ) 20 MG tablet Take 20 mg by mouth as needed for fluid.    [provider]  hydroxychloroquine  (PLAQUENIL ) 200 MG tablet Take 400 mg by mouth at bedtime.    [provider]  losartan  (COZAAR ) 100 MG tablet Take 100 mg by mouth at bedtime.    [provider]  MELATONIN PO Take 8 mg by mouth at bedtime.    [provider]  pantoprazole  (PROTONIX ) 40 MG tablet TAKE 1 TABLET(40 MG) BY MOUTH TWICE DAILY 03/25/23   Cirigliano, Vito V, DO  polyethylene glycol (MIRALAX  / GLYCOLAX ) 17 g packet Take 17 g by mouth daily as needed for mild constipation. 10/27/23   Sebastian Toribio GAILS, MD  predniSONE  (DELTASONE ) 5 MG tablet Take 5 mg by mouth daily with breakfast.    [provider]  senna (SENOKOT) 8.6 MG tablet Take 1 tablet (8.6 mg total) by mouth 2 (two) times daily. 10/27/23   Sebastian Toribio GAILS, MD  XARELTO  20 MG TABS tablet Take 20 mg by mouth in the morning. 02/20/19   [provider]  zinc  gluconate 50 MG tablet Take 50 mg by mouth in the morning.    [provider]    Allergies: Cefadroxil , Lisinopril, and Nsaids    Review of Systems  Eyes:  Positive for redness.  All other systems reviewed and are negative.   Updated Vital Signs  BP 133/83 (BP Location: Right Arm)   Pulse 63   Temp 98.1 F (36.7 C)   Resp 18   SpO2 97%   Physical Exam Vitals and nursing note reviewed.  Constitutional:      General: He is not in acute distress.    Appearance: Normal appearance. He is not ill-appearing or diaphoretic.  HENT:     Head: Normocephalic and atraumatic.  Eyes:     General: Lids are normal. Vision grossly intact. Gaze aligned appropriately. No visual field deficit or scleral icterus.       Right eye: No foreign body or discharge.        Left eye: No foreign body or discharge.     Extraocular Movements: Extraocular movements intact.     Right eye: Normal  extraocular motion and no nystagmus.     Left eye: Normal extraocular motion and no nystagmus.     Conjunctiva/sclera:     Right eye: Right conjunctiva is not injected. Hemorrhage present. No chemosis or exudate.    Left eye: Left conjunctiva is not injected. No chemosis, exudate or hemorrhage.    Pupils: Pupils are equal, round, and reactive to light. Pupils are equal.     Right eye: Pupil is round, reactive and not sluggish. Corneal abrasion and fluorescein  uptake present. Seidel exam negative.     Left eye: Pupil is round, reactive and not sluggish.     Slit lamp exam:    Right eye: No hyphema or photophobia.     Left eye: Photophobia present. No hyphema.     Visual Fields: Right eye visual fields normal and left eye visual fields normal.  Cardiovascular:     Rate and Rhythm: Normal rate and regular rhythm.     Pulses: Normal pulses.     Heart sounds: Normal heart sounds. No murmur heard.    No friction rub. No gallop.  Pulmonary:     Effort: Pulmonary effort is normal. No respiratory distress.     Breath sounds: Normal breath sounds.  Abdominal:     General: Abdomen is flat.     Palpations: Abdomen is soft.     Tenderness: There is no abdominal tenderness.  Musculoskeletal:     Cervical back: Normal range of motion and neck supple. No rigidity or tenderness.  Skin:    General: Skin is warm and dry.  Neurological:     General: No focal deficit present.     Mental Status: He is alert. Mental status is at baseline.  Psychiatric:        Mood and Affect: Mood normal.     (all labs ordered are listed, but only abnormal results are displayed) Labs Reviewed - No data to display  EKG: None  Radiology: No results found.  Procedures   Medications Ordered in the ED  fluorescein  ophthalmic strip 1 strip (has no administration in time range)                                Medical Decision Making Risk Prescription drug management.   This patient is a 78 year old male who  presents to the ED for concern of right eye subconjunctival hemorrhage noticed earlier today when he had an eyelash fall onto his eye, scratch his eye and then an hour later noticed the redness.  Currently is not experiencing any photophobia, vision changes or pain with EOM.  However does feel like it is currently something  in my eye.  On physical exam, patient is in no acute distress, afebrile, alert and orient x 4, speaking in full sentences, nontachypneic, nontachycardic.  No foreign bodies noted to eye, PERRL, EOMs intact with no pain, no chemosis, no hyphema.  Notably there was fluorescein  uptake to the lateral aspect of right eye, indicating corneal abrasion.  No foreign bodies noted.  With patient noting to have corneal abrasion on Woods lamp examination, will have him continue to use erythromycin  ointment for the next 5 days, follow-up with ophthalmology as needed, return to the ED for new or worsening symptoms  Patient vital signs have remained stable throughout the course of patient's time in the ED. Low suspicion for any other emergent pathology at this time. I believe this patient is safe to be discharged. Provided strict return to ER precautions. Patient expressed agreement and understanding of plan. All questions were answered.  Differential diagnoses prior to evaluation: The emergent differential diagnosis includes, but is not limited to, ruptured globe, corneal abrasion, subungual hemorrhage, conjunctivitis,. This is not an exhaustive differential.   Past Medical History / Co-morbidities / Social History: Status post Whipple procedure, HTN, bowel obstruction, recurrent PE currently taking Xarelto , diverticulosis, rheumatoid arthritis, CKD, polymyalgia rheumatica, membranous glomera nephritis.   Additional history: Chart reviewed. Pertinent results include:   Discharge from the hospital in 10/27/2023 after being treated for fever with recurrent E. coli  bacteremia.   Medications: I ordered medication including erythromycin  ointment.  I have reviewed the patients home medicines and have made adjustments as needed.  Critical Interventions: None  Social Determinants of Health: Has good follow-up with PCP  Disposition: After consideration of the diagnostic results and the patients response to treatment, I feel that the patient would benefit from discharge and treatment as above.   emergency department workup does not suggest an emergent condition requiring admission or immediate intervention beyond what has been performed at this time. The plan is: Follow-up with PCP and/or ophthalmology as needed, return to ED for new or worsening symptoms, arthritis and for corneal abrasion. The patient is safe for discharge and has been instructed to return immediately for worsening symptoms, change in symptoms or any other concerns.   Final diagnoses:  Abrasion of right cornea, initial encounter  Subconjunctival hemorrhage of right eye    ED Discharge Orders          Ordered    erythromycin  ophthalmic ointment        11/03/23 [address] [place], PA-C 11/03/23 1648    Geraldene Hamilton, MD 11/12/23 1036

## 2023-11-03 NOTE — Discharge Instructions (Signed)
 You were seen today for corneal abrasion and subconjunctival hemorrhage.  The hemorrhage will resolve on its own without for any further need for treatment at this time.  However the corneal abrasion will require antibiotic ointment for the next 5 days.  Please be sure to apply 4 times a day.  Follow-up with ophthalmology if going to have any persistent eye pain.  Return to the ED for any new or worsening symptoms.

## 2023-11-03 NOTE — ED Triage Notes (Signed)
 He felt as if he had right eye irritation and it is now red and is excessively tearing. He is ambulatory and in no distress. Hie wife is with him.

## 2023-11-04 DIAGNOSIS — K922 Gastrointestinal hemorrhage, unspecified: Secondary | ICD-10-CM | POA: Diagnosis not present

## 2023-11-04 DIAGNOSIS — Z6823 Body mass index (BMI) 23.0-23.9, adult: Secondary | ICD-10-CM | POA: Diagnosis not present

## 2023-11-04 DIAGNOSIS — Z87898 Personal history of other specified conditions: Secondary | ICD-10-CM | POA: Diagnosis not present

## 2023-11-04 DIAGNOSIS — S0501XD Injury of conjunctiva and corneal abrasion without foreign body, right eye, subsequent encounter: Secondary | ICD-10-CM | POA: Diagnosis not present

## 2023-11-23 ENCOUNTER — Other Ambulatory Visit: Payer: Self-pay | Admitting: Gastroenterology

## 2024-01-29 DIAGNOSIS — D137 Benign neoplasm of endocrine pancreas: Secondary | ICD-10-CM | POA: Diagnosis not present

## 2024-01-29 DIAGNOSIS — C7A8 Other malignant neuroendocrine tumors: Secondary | ICD-10-CM | POA: Diagnosis not present

## 2024-01-29 DIAGNOSIS — C189 Malignant neoplasm of colon, unspecified: Secondary | ICD-10-CM | POA: Diagnosis not present

## 2024-02-07 DIAGNOSIS — B999 Unspecified infectious disease: Secondary | ICD-10-CM | POA: Diagnosis not present

## 2024-02-07 DIAGNOSIS — M0600 Rheumatoid arthritis without rheumatoid factor, unspecified site: Secondary | ICD-10-CM | POA: Diagnosis not present

## 2024-02-07 DIAGNOSIS — N052 Unspecified nephritic syndrome with diffuse membranous glomerulonephritis: Secondary | ICD-10-CM | POA: Diagnosis not present

## 2024-02-07 DIAGNOSIS — Z7952 Long term (current) use of systemic steroids: Secondary | ICD-10-CM | POA: Diagnosis not present

## 2024-02-07 DIAGNOSIS — Z79899 Other long term (current) drug therapy: Secondary | ICD-10-CM | POA: Diagnosis not present

## 2024-03-08 ENCOUNTER — Encounter: Payer: Self-pay | Admitting: Gastroenterology

## 2024-03-09 ENCOUNTER — Telehealth: Payer: Self-pay | Admitting: Gastroenterology

## 2024-03-09 DIAGNOSIS — Z860100 Personal history of colon polyps, unspecified: Secondary | ICD-10-CM

## 2024-03-09 DIAGNOSIS — Z85038 Personal history of other malignant neoplasm of large intestine: Secondary | ICD-10-CM

## 2024-03-09 NOTE — Telephone Encounter (Signed)
 Inbound call from patients wife stating she as told husband can be scheduled before the end of the year for procedure but since patient is on blood thinners he would need to be seen in office first and on my end, soonest availability will be for 04/16/2024 with PA. Patients wife would like something sooner  Requesting a call back  Please advise  Thank you

## 2024-03-11 MED ORDER — NA SULFATE-K SULFATE-MG SULF 17.5-3.13-1.6 GM/177ML PO SOLN: 1.0000 | Freq: Once | ORAL | 0 refills | Status: AC

## 2024-03-11 NOTE — Telephone Encounter (Signed)
 Patient trying to get in  1 yr f/u colonoscopy before end of year. Had messaged us  11/17 and somehow message lost and not answered. You have an opening Friday.  If we can obtain verbal/fax to hold Xarelto  can we add him to your schedule Friday? You don't have any other openings in [facility] after Friday until January.  He can't  get into [facility] until April, Please advise.

## 2024-03-11 NOTE — Telephone Encounter (Signed)
 Spoke with Mrs Stephen Gross. Advised patient to hold Xarelto  until after colonoscopy,  Dr Cirgliano will advise when to restart. Begin weight based Lovenox  80 mg Tonight and again in 12 hours tomorrow.  Do not take Lovenox  tomorrow night  (12/4) Patient to start 2 day prep today with Dulcolax and Miralax  . Suprep tomorrow night and Friday morning.  Instructions sent through MyChart. Wife read back instructions accurately.

## 2024-03-11 NOTE — Telephone Encounter (Signed)
 Pateint's wife spoke with PCP.  They will provide hold and prescribe Lovenox  if you can specify  how many days to hold Xarelto  and bridging with Lovenox .

## 2024-03-11 NOTE — Telephone Encounter (Signed)
 Does he need the lovenox  bridge? He has held his Xarelto  .

## 2024-03-13 ENCOUNTER — Ambulatory Visit: Admitting: Gastroenterology

## 2024-03-13 ENCOUNTER — Encounter: Admitting: Gastroenterology

## 2024-03-13 ENCOUNTER — Encounter: Payer: Self-pay | Admitting: Gastroenterology

## 2024-03-13 VITALS — BP 111/91 | HR 60 | Temp 97.7°F | Resp 12 | Ht 73.0 in | Wt 176.0 lb

## 2024-03-13 DIAGNOSIS — Z1211 Encounter for screening for malignant neoplasm of colon: Secondary | ICD-10-CM | POA: Diagnosis not present

## 2024-03-13 DIAGNOSIS — K5730 Diverticulosis of large intestine without perforation or abscess without bleeding: Secondary | ICD-10-CM

## 2024-03-13 DIAGNOSIS — I10 Essential (primary) hypertension: Secondary | ICD-10-CM | POA: Diagnosis not present

## 2024-03-13 DIAGNOSIS — D125 Benign neoplasm of sigmoid colon: Secondary | ICD-10-CM | POA: Diagnosis not present

## 2024-03-13 DIAGNOSIS — C189 Malignant neoplasm of colon, unspecified: Secondary | ICD-10-CM

## 2024-03-13 DIAGNOSIS — Z980 Intestinal bypass and anastomosis status: Secondary | ICD-10-CM | POA: Diagnosis not present

## 2024-03-13 DIAGNOSIS — K648 Other hemorrhoids: Secondary | ICD-10-CM | POA: Diagnosis not present

## 2024-03-13 DIAGNOSIS — K641 Second degree hemorrhoids: Secondary | ICD-10-CM

## 2024-03-13 DIAGNOSIS — Z85038 Personal history of other malignant neoplasm of large intestine: Secondary | ICD-10-CM | POA: Diagnosis not present

## 2024-03-13 DIAGNOSIS — Z87891 Personal history of nicotine dependence: Secondary | ICD-10-CM | POA: Diagnosis not present

## 2024-03-13 DIAGNOSIS — Z09 Encounter for follow-up examination after completed treatment for conditions other than malignant neoplasm: Secondary | ICD-10-CM | POA: Diagnosis not present

## 2024-03-13 DIAGNOSIS — Z860101 Personal history of adenomatous and serrated colon polyps: Secondary | ICD-10-CM | POA: Diagnosis not present

## 2024-03-13 DIAGNOSIS — E7800 Pure hypercholesterolemia, unspecified: Secondary | ICD-10-CM | POA: Diagnosis not present

## 2024-03-13 MED ORDER — SODIUM CHLORIDE 0.9 % IV SOLN: 500.0000 mL | Freq: Once | INTRAVENOUS | Status: DC

## 2024-03-13 NOTE — Progress Notes (Signed)
 GASTROENTEROLOGY PROCEDURE H&P NOTE   Primary Care Physician: Street, Lonni HERO, MD    Reason for Procedure:   Colon cancer surveillance  Plan:    Colonoscopy  Patient is appropriate for endoscopic procedure(s) in the ambulatory ([facility]) setting.  The nature of the procedure, as well as the risks, benefits, and alternatives were carefully and thoroughly reviewed with the patient. Ample time for discussion and questions allowed. The patient understood, was satisfied, and agreed to proceed. I personally addressed all patient questions and concerns.     HPI: Stephen Gross is a 78 y.o. male who presents for colonoscopy for ongoing surveillance.   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     - 01/07/2018: Colonoscopy: 2 subcentimeter adenomas, diverticulosis, internal hemorrhoids - 11/28/2022: Colonoscopy: 3 mm cecal adenoma, 8 mm cecal SSP, 20 mm polyp in the descending colon which was endoscopically unresectable and tattoo placed (biopsied with path showing at least intramucosal adenocarcinoma with areas suspicious for submucosal invasion with background fragments of tubular adenoma and focal HGD).  Diverticulosis in the sigmoid and transverse colon, small internal hemorrhoids - 12/31/2022 Colonoscopy [facility]): 25 mm polyp in the proximal descending colon which was not felt to be endoscopically resectable and tattoo placed and referred for surgical resection - 01/21/2023: Open surgical resection with right hemicolectomy at [facility] (path:invasive moderately differentiated adenocarcinoma of the transverse colon  arising in tubular adenoma with high-grade dysplasia. Incidental well-differentiated neuroendocrine tumor at the appendiceal tip. 0/16 lymph nodes involved.)  Past Medical History:  Diagnosis Date   ANA positive    Arthritis    Blood clot in vein    in lung; unprevoked pulmonary embolisms in  2018 and 04/07/2018 managed on xarelto     Cancer (HCC)    insulinoma   Colon cancer (HCC) 01/21/2023   Diverticulosis    DVT (deep venous thrombosis) (HCC)    E coli bacteremia    Hypertension    Insulinoma    Membranous glomerulonephritis    Pancreatitis    Pulmonary embolism (HCC)    Pure hypercholesterolemia    Thyroid  nodule     Past Surgical History:  Procedure Laterality Date   BIOPSY THYROID      CARPAL TUNNEL RELEASE Bilateral    CHOLECYSTECTOMY     INGUINAL HERNIA REPAIR Left 03/19/2019   Procedure: OPEN REPAIR OF LEFT INGUINAL HERNIA WITH MESH;  Surgeon: Tanda Locus, MD;  Location: [facility];  Service: General;  Laterality: Left;   PANCREATICODUODENECTOMY  2002   PARTIAL COLECTOMY     RENAL BIOPSY     TONSILLECTOMY     WHIPPLE PROCEDURE      Prior to Admission medications   Medication Sig Start Date End Date Taking? Authorizing Provider  amLODipine  (NORVASC ) 5 MG tablet Take 5 mg by mouth at bedtime.   Yes [provider]  atorvastatin  (LIPITOR) 40 MG tablet Take 40 mg by mouth at bedtime.  12/06/17  Yes [provider]  Calcium  Carb-Cholecalciferol  (CALCIUM  600 + D PO) Take 1 capsule by mouth in the morning.   Yes [provider]  cholecalciferol  (VITAMIN D3) 25 MCG (1000 UNIT) tablet Take 1,000 Units by mouth in the morning.   Yes  [provider]  Cyanocobalamin  (B-12) 1000 MCG TABS Take 1 tablet by mouth in the morning.   Yes [provider]  enoxaparin  (LOVENOX ) 80 MG/0.8ML injection SMARTSIG:80 Milligram(s) SUB-Q 03/11/24  Yes [provider]  finasteride  (PROSCAR ) 5 MG tablet Take 5 mg by mouth at bedtime.   Yes  [provider]  hydroxychloroquine  (PLAQUENIL ) 200 MG tablet Take 400 mg by mouth at bedtime.   Yes [provider]  losartan  (COZAAR ) 100 MG tablet Take 100 mg by mouth at bedtime.   Yes [provider]  predniSONE  (DELTASONE ) 5 MG tablet Take 5 mg by mouth daily with breakfast.   Yes [provider]  zinc  gluconate 50 MG tablet Take 50 mg by mouth in the morning.   Yes [provider]  acetaminophen  (TYLENOL ) 650 MG CR tablet Take 1 tablet (650 mg total) by mouth every 4 (four) hours as needed for pain. 10/27/23   Sebastian Toribio GAILS, MD  ascorbic acid  (VITAMIN C) 500 MG tablet Take 500 mg by mouth in the morning.    [provider]  erythromycin  ophthalmic ointment Place a 1/2 inch ribbon of ointment into the lower eyelid. 11/03/23   Bauer, Collin S, PA-C  ferrous sulfate  325 (65 FE) MG tablet Take 325 mg by mouth every Monday, Wednesday, and Friday.    [provider]  furosemide  (LASIX ) 20 MG tablet Take 20 mg by mouth as needed for fluid.    [provider]  MELATONIN PO Take 8 mg by mouth at bedtime.    [provider]  pantoprazole  (PROTONIX ) 40 MG tablet Take 1 tablet (40 mg total) by mouth 2 (two) times daily. 11/25/23   Schylar Wuebker V, DO  polyethylene glycol (MIRALAX  / GLYCOLAX ) 17 g packet Take 17 g by mouth daily as needed for mild constipation. 10/27/23   Sebastian Toribio GAILS, MD  senna (SENOKOT) 8.6 MG tablet Take 1 tablet (8.6 mg total) by mouth 2 (two) times daily. 10/27/23   Sebastian Toribio GAILS, MD  XARELTO  20 MG TABS tablet Take 20 mg by mouth in the morning. 02/20/19   [provider]    Current Outpatient Medications  Medication Sig Dispense Refill   amLODipine  (NORVASC ) 5 MG tablet Take 5 mg by mouth at bedtime.     atorvastatin  (LIPITOR) 40 MG tablet Take 40 mg by mouth at bedtime.   11   Calcium  Carb-Cholecalciferol  (CALCIUM  600 + D PO) Take 1 capsule by mouth in the morning.      cholecalciferol  (VITAMIN D3) 25 MCG (1000 UNIT) tablet Take 1,000 Units by mouth in the morning.     Cyanocobalamin  (B-12) 1000 MCG TABS Take 1 tablet by mouth in the morning.     enoxaparin  (LOVENOX ) 80 MG/0.8ML injection SMARTSIG:80 Milligram(s) SUB-Q     finasteride  (PROSCAR ) 5 MG tablet Take 5 mg by mouth at bedtime.     hydroxychloroquine  (PLAQUENIL ) 200 MG tablet Take 400 mg by mouth at bedtime.     losartan  (COZAAR ) 100 MG tablet Take 100 mg by mouth at bedtime.     predniSONE  (DELTASONE ) 5 MG tablet Take 5 mg by mouth daily with breakfast.     zinc  gluconate 50 MG tablet Take 50 mg by mouth in the morning.     acetaminophen  (TYLENOL ) 650 MG CR tablet Take 1 tablet (650 mg total) by mouth every 4 (four) hours as needed for pain.     ascorbic acid  (VITAMIN C) 500 MG tablet Take 500  mg by mouth in the morning.     erythromycin  ophthalmic ointment Place a 1/2 inch ribbon of ointment into the lower eyelid. 3.5 g 0   ferrous sulfate  325 (65 FE) MG tablet Take 325 mg by mouth every Monday, Wednesday, and Friday.     furosemide  (LASIX ) 20 MG tablet Take 20 mg by mouth as needed for fluid.     MELATONIN PO Take 8 mg by mouth at bedtime.     pantoprazole  (PROTONIX ) 40 MG tablet Take 1 tablet (40 mg total) by mouth 2 (two) times daily. 180 tablet 3   polyethylene glycol (MIRALAX  / GLYCOLAX ) 17 g packet Take 17 g by mouth daily as needed for mild constipation. 30 each 0   senna (SENOKOT) 8.6 MG tablet Take 1 tablet (8.6 mg total) by mouth 2 (two) times daily.     XARELTO  20 MG TABS tablet Take 20 mg by mouth in the morning.     Current Facility-Administered Medications  Medication Dose Route Frequency Provider Last Rate Last Admin   0.9 %  sodium chloride  infusion  500 mL Intravenous Once Cathi Hazan V, DO        Allergies as of 03/13/2024 - Review Complete 03/13/2024  Allergen Reaction Noted   Nsaids Other (See Comments) 11/25/2017   Cefadroxil  Dermatitis 07/18/2023   Lisinopril Cough  03/11/2019    Family History  Problem Relation Age of Onset   Heart disease Mother    Heart disease Father    Healthy Sister    Healthy Brother    Healthy Maternal Grandmother    Healthy Maternal Grandfather    Healthy Paternal Grandfather    Lung cancer Paternal Uncle    Healthy Other    Colon cancer Neg Hx    Esophageal cancer Neg Hx    Ovarian cancer Neg Hx    Pancreatic cancer Neg Hx    Stomach cancer Neg Hx     Social History   Socioeconomic History   Marital status: Married    Spouse name: Not on file   Number of children: 2   Years of education: Not on file   Highest education level: Not on file  Occupational History   Occupation: z=Retired  Tobacco Use   Smoking status: Former    Current packs/day: 0.00    Types: Cigarettes    Quit date: 1972    Years since quitting: 53.9   Smokeless tobacco: Never  Vaping Use   Vaping status: Never Used  Substance and Sexual Activity   Alcohol use: Yes    Comment: Occ   Drug use: Never   Sexual activity: Yes  Other Topics Concern   Not on file  Social History Narrative   Not on file   Social Drivers of Health   Financial Resource Strain: Low Risk  (01/29/2023)   Received from [facility]   Overall Financial Resource Strain (CARDIA)    Difficulty of Paying Living Expenses: Not hard at all  Food Insecurity: No Food Insecurity (10/24/2023)   Hunger Vital Sign    Worried About Running Out of Food in the Last Year: Never true    Ran Out of Food in the Last Year: Never true  Transportation Needs: No Transportation Needs (10/24/2023)   PRAPARE - Administrator, Civil Service (Medical): No    Lack of Transportation (Non-Medical): No  Physical Activity: Unknown (10/25/2023)   Exercise Vital Sign    Days of Exercise per Week: Patient declined  Minutes of Exercise per Session: Not on file  Stress: No Stress Concern Present (10/25/2023)   [employer] of Occupational Health -  Occupational Stress Questionnaire    Feeling of Stress: Not at all  Social Connections: Socially Integrated (10/24/2023)   Social Connection and Isolation Panel    Frequency of Communication with Friends and Family: More than three times a week    Frequency of Social Gatherings with Friends and Family: More than three times a week    Attends Religious Services: More than 4 times per year    Active Member of [employer] or Organizations: Yes    Attends Banker Meetings: 1 to 4 times per year    Marital Status: Married  Catering Manager Violence: Not At Risk (10/24/2023)   Humiliation, Afraid, Rape, and Kick questionnaire    Fear of Current or Ex-Partner: No    Emotionally Abused: No    Physically Abused: No    Sexually Abused: No    Physical Exam: Vital signs in last 24 hours: @BP  129/72   Pulse 73   Temp 97.7 F (36.5 C)   Ht 6' 1 (1.854 m)   Wt 176 lb (79.8 kg)   SpO2 96%   BMI 23.22 kg/m  GEN: NAD EYE: Sclerae anicteric ENT: MMM CV: Non-tachycardic Pulm: CTA b/l GI: Soft, NT/ND NEURO:  Alert & Oriented x 3   Sandor Flatter, DO [facility]   03/13/2024 9:37 AM

## 2024-03-13 NOTE — Progress Notes (Signed)
 Called to room to assist during endoscopic procedure.  Patient ID and intended procedure confirmed with present staff. Received instructions for my participation in the procedure from the performing physician.

## 2024-03-13 NOTE — Patient Instructions (Addendum)
-  Await pathology results -Handout on polyps provided -You can resume your Xarelto  at prior dose tomorrow  YOU HAD AN ENDOSCOPIC PROCEDURE TODAY AT THE [facility]:   Refer to the procedure report that was given to you for any specific questions about what was found during the examination.  If the procedure report does not answer your questions, please call your gastroenterologist to clarify.  If you requested that your care partner not be given the details of your procedure findings, then the procedure report has been included in a sealed envelope for you to review at your convenience later.  YOU SHOULD EXPECT: Some feelings of bloating in the abdomen. Passage of more gas than usual.  Walking can help get rid of the air that was put into your GI tract during the procedure and reduce the bloating. If you had a lower endoscopy (such as a colonoscopy or flexible sigmoidoscopy) you may notice spotting of blood in your stool or on the toilet paper. If you underwent a bowel prep for your procedure, you may not have a normal bowel movement for a few days.  Please Note:  You might notice some irritation and congestion in your nose or some drainage.  This is from the oxygen used during your procedure.  There is no need for concern and it should clear up in a day or so.  SYMPTOMS TO REPORT IMMEDIATELY:  Following lower endoscopy (colonoscopy or flexible sigmoidoscopy):  Excessive amounts of blood in the stool  Significant tenderness or worsening of abdominal pains  Swelling of the abdomen that is new, acute  Fever of 100F or higher  For urgent or emergent issues, a gastroenterologist can be reached at any hour by calling [phone]. Do not use MyChart messaging for urgent concerns.    DIET:  We do recommend a small meal at first, but then you may proceed to your regular diet.  Drink plenty of fluids but you should avoid alcoholic beverages for 24 hours.  ACTIVITY:  You should plan to  take it easy for the rest of today and you should NOT DRIVE or use heavy machinery until tomorrow (because of the sedation medicines used during the test).    FOLLOW UP: Our staff will call the number listed on your records the next business day following your procedure.  We will call around 7:15- 8:00 am to check on you and address any questions or concerns that you may have regarding the information given to you following your procedure. If we do not reach you, we will leave a message.     If any biopsies were taken you will be contacted by phone or by letter within the next 1-3 weeks.  Please call us  at [phone] if you have not heard about the biopsies in 3 weeks.    SIGNATURES/CONFIDENTIALITY: You and/or your care partner have signed paperwork which will be entered into your electronic medical record.  These signatures attest to the fact that that the information above on your After Visit Summary has been reviewed and is understood.  Full responsibility of the confidentiality of this discharge information lies with you and/or your care-partner.

## 2024-03-13 NOTE — Op Note (Signed)
 [facility] Patient Name: Stephen Gross Procedure Date: 03/13/2024 9:38 AM MRN: [id number] Endoscopist: Sandor Flatter , MD, [id number] Age: 78 Referring MD:  Date of Birth: Aug 31, 1945 Gender: Male Account #: [id number] Procedure:                Colonoscopy Indications:              High risk colon cancer surveillance: Personal                            history of colon cancer                           - 01/07/2018: Colonoscopy: 2 subcentimeter adenomas,                            diverticulosis, internal hemorrhoids                           - 11/28/2022: Colonoscopy: 3 mm cecal adenoma, 8 mm                            cecal SSP, 20 mm polyp in the descending colon                            which was endoscopically unresectable and tattoo                            placed (biopsied with path showing at least                            intramucosal adenocarcinoma with areas suspicious                            for submucosal invasion with background fragments                            of tubular adenoma and focal HGD). Diverticulosis                            in the sigmoid and transverse colon, small internal                            hemorrhoids                           - 12/31/2022 Colonoscopy [facility]): 25 mm polyp in the                            proximal descending colon which was not felt to be                            endoscopically resectable and tattoo placed and                            referred for surgical resection                           -  01/21/2023: Open surgical resection with right                            hemicolectomy at [facility] (path:invasive moderately                            differentiated adenocarcinoma of the transverse                            colon arising in tubular adenoma with high-grade                            dysplasia. 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 0/16                             lymph nodes involved.) Medicines:                Monitored Anesthesia Care Procedure:                Pre-Anesthesia Assessment:                           - Prior to the procedure, a History and Physical                            was performed, and patient medications and                            allergies were reviewed. The patient's tolerance of                            previous anesthesia was also reviewed. The risks                            and benefits of the procedure and the sedation                            options and risks were discussed with the patient.                            All questions were answered, and informed consent                            was obtained. Prior Anticoagulants: The patient has                            taken Xarelto  (rivaroxaban ), last dose was 2 days                            prior to procedure. ASA Grade Assessment: III - A  patient with severe systemic disease. After                            reviewing the risks and benefits, the patient was                            deemed in satisfactory condition to undergo the                            procedure.                           After obtaining informed consent, the colonoscope                            was passed under direct vision. Throughout the                            procedure, the patient's blood pressure, pulse, and                            oxygen saturations were monitored continuously. The                            PCF-HQ190L Colonoscope [id number] was introduced                            through the anus and advanced to the the                            ileocolonic anastomosis. The colonoscopy was                            performed without difficulty. The patient tolerated                            the procedure well. The quality of the bowel                            preparation was good. The rectum, ileocolonic                             anastamosis, neo-terminal ileum were photographed. Scope In: 9:48:39 AM Scope Out: 10:07:54 AM Scope Withdrawal Time: 0 hours 14 minutes 32 seconds  Total Procedure Duration: 0 hours 19 minutes 15 seconds  Findings:                 The perianal and digital rectal examinations were                            normal.                           There was evidence of a prior end-to-side  ileo-colonic anastomosis in the transverse colon.                            This was patent and was characterized by healthy                            appearing mucosa. The anastomosis was traversed.                           The neo-terminal ileum appeared normal.                           Multiple small-mouthed diverticula were found in                            the sigmoid colon and descending colon.                           A 4 mm polyp was found in the sigmoid colon. The                            polyp was sessile. The polyp was removed with a                            cold snare. Resection and retrieval were complete.                            Estimated blood loss was minimal.                           Non-bleeding internal hemorrhoids were found during                            retroflexion. The hemorrhoids were small. Complications:            No immediate complications. Estimated Blood Loss:     Estimated blood loss was minimal. Impression:               - Patent end-to-side ileo-colonic anastomosis,                            characterized by healthy appearing mucosa.                           - The examined portion of the ileum was normal.                           - Diverticulosis in the sigmoid colon and in the                            descending colon.                           - One 4 mm polyp in the sigmoid colon, removed with  a cold snare. Resected and retrieved.                           - Non-bleeding  internal hemorrhoids. Recommendation:           - Patient has a contact number available for                            emergencies. The signs and symptoms of potential                            delayed complications were discussed with the                            patient. Return to normal activities tomorrow.                            Written discharge instructions were provided to the                            patient.                           - Resume previous diet.                           - Continue present medications.                           - Await pathology results.                           - Repeat colonoscopy for surveillance based on                            pathology results.                           - Resume Xarelto  (rivaroxaban ) at prior dose                            tomorrow. Sandor Flatter, MD 03/13/2024 10:19:01 AM

## 2024-03-13 NOTE — Progress Notes (Signed)
 Sedate, gd SR, tolerated procedure well, VSS, report to RN

## 2024-03-16 ENCOUNTER — Telehealth: Payer: Self-pay | Admitting: Lactation Services

## 2024-03-16 NOTE — Telephone Encounter (Signed)
 No answer no voice mail

## 2024-03-17 LAB — SURGICAL PATHOLOGY

## 2024-03-20 ENCOUNTER — Ambulatory Visit: Payer: Self-pay | Admitting: Gastroenterology
